# Patient Record
Sex: Male | Born: 1945 | Race: Black or African American | Hispanic: No | Marital: Married | Smoking: Former smoker
Health system: Southern US, Community
[De-identification: ages and names within clinical notes are randomized; demographics above are authoritative.]

## PROBLEM LIST (undated history)

## (undated) DIAGNOSIS — E119 Type 2 diabetes mellitus without complications: Secondary | ICD-10-CM

## (undated) DIAGNOSIS — I209 Angina pectoris, unspecified: Secondary | ICD-10-CM

## (undated) DIAGNOSIS — E669 Obesity, unspecified: Secondary | ICD-10-CM

## (undated) DIAGNOSIS — I739 Peripheral vascular disease, unspecified: Secondary | ICD-10-CM

## (undated) DIAGNOSIS — I119 Hypertensive heart disease without heart failure: Secondary | ICD-10-CM

## (undated) DIAGNOSIS — M519 Unspecified thoracic, thoracolumbar and lumbosacral intervertebral disc disorder: Secondary | ICD-10-CM

## (undated) DIAGNOSIS — I10 Essential (primary) hypertension: Secondary | ICD-10-CM

## (undated) HISTORY — PX: TONSILLECTOMY: SUR1361

## (undated) HISTORY — PX: JOINT REPLACEMENT: SHX530

## (undated) HISTORY — PX: BACK SURGERY: SHX140

---

## 1998-11-11 ENCOUNTER — Encounter: Payer: Self-pay | Admitting: Orthopedic Surgery

## 1998-11-16 ENCOUNTER — Encounter: Payer: Self-pay | Admitting: Orthopedic Surgery

## 1998-11-16 ENCOUNTER — Inpatient Hospital Stay (HOSPITAL_COMMUNITY): Admission: RE | Admit: 1998-11-16 | Discharge: 1998-11-20 | Payer: Self-pay | Admitting: Orthopedic Surgery

## 1999-09-04 ENCOUNTER — Emergency Department (HOSPITAL_COMMUNITY): Admission: EM | Admit: 1999-09-04 | Discharge: 1999-09-04 | Payer: Self-pay | Admitting: Emergency Medicine

## 1999-09-04 ENCOUNTER — Encounter: Payer: Self-pay | Admitting: Emergency Medicine

## 2000-11-04 ENCOUNTER — Encounter: Payer: Self-pay | Admitting: Orthopedic Surgery

## 2000-11-04 ENCOUNTER — Ambulatory Visit (HOSPITAL_COMMUNITY): Admission: RE | Admit: 2000-11-04 | Discharge: 2000-11-04 | Payer: Self-pay | Admitting: Orthopedic Surgery

## 2001-01-30 ENCOUNTER — Emergency Department (HOSPITAL_COMMUNITY): Admission: EM | Admit: 2001-01-30 | Discharge: 2001-01-30 | Payer: Self-pay

## 2001-05-25 ENCOUNTER — Emergency Department (HOSPITAL_COMMUNITY): Admission: EM | Admit: 2001-05-25 | Discharge: 2001-05-25 | Payer: Self-pay | Admitting: Emergency Medicine

## 2002-04-09 ENCOUNTER — Encounter: Admission: RE | Admit: 2002-04-09 | Discharge: 2002-06-16 | Payer: Self-pay

## 2004-06-17 ENCOUNTER — Emergency Department (HOSPITAL_COMMUNITY): Admission: EM | Admit: 2004-06-17 | Discharge: 2004-06-17 | Payer: Self-pay | Admitting: Emergency Medicine

## 2004-07-13 ENCOUNTER — Inpatient Hospital Stay (HOSPITAL_COMMUNITY): Admission: AD | Admit: 2004-07-13 | Discharge: 2004-07-16 | Payer: Self-pay | Admitting: Orthopaedic Surgery

## 2004-09-14 ENCOUNTER — Ambulatory Visit (HOSPITAL_COMMUNITY): Admission: RE | Admit: 2004-09-14 | Discharge: 2004-09-14 | Payer: Self-pay | Admitting: Orthopaedic Surgery

## 2004-09-22 ENCOUNTER — Encounter: Admission: RE | Admit: 2004-09-22 | Discharge: 2004-09-22 | Payer: Self-pay | Admitting: Orthopaedic Surgery

## 2005-11-26 ENCOUNTER — Encounter: Admission: RE | Admit: 2005-11-26 | Discharge: 2005-11-26 | Payer: Self-pay | Admitting: Family Medicine

## 2006-09-20 ENCOUNTER — Encounter: Admission: RE | Admit: 2006-09-20 | Discharge: 2006-09-20 | Payer: Self-pay | Admitting: Family Medicine

## 2008-02-11 ENCOUNTER — Encounter: Admission: RE | Admit: 2008-02-11 | Discharge: 2008-02-11 | Payer: Self-pay | Admitting: Cardiovascular Disease

## 2008-02-17 ENCOUNTER — Ambulatory Visit (HOSPITAL_COMMUNITY): Admission: RE | Admit: 2008-02-17 | Discharge: 2008-02-18 | Payer: Self-pay | Admitting: Cardiovascular Disease

## 2010-03-31 ENCOUNTER — Inpatient Hospital Stay (HOSPITAL_COMMUNITY): Admission: RE | Admit: 2010-03-31 | Discharge: 2010-04-04 | Payer: Self-pay | Admitting: Orthopaedic Surgery

## 2010-10-07 ENCOUNTER — Encounter: Payer: Self-pay | Admitting: Orthopaedic Surgery

## 2010-10-08 ENCOUNTER — Encounter: Payer: Self-pay | Admitting: Family Medicine

## 2010-10-13 ENCOUNTER — Encounter
Admission: RE | Admit: 2010-10-13 | Discharge: 2010-10-13 | Payer: Self-pay | Source: Home / Self Care | Attending: Family Medicine | Admitting: Family Medicine

## 2010-10-17 ENCOUNTER — Inpatient Hospital Stay (HOSPITAL_COMMUNITY)
Admission: AD | Admit: 2010-10-17 | Discharge: 2010-10-20 | DRG: 312 | Disposition: A | Discharge status: Home / Self Care | Payer: 59 | Attending: Cardiology | Admitting: Cardiology

## 2010-10-17 DIAGNOSIS — I428 Other cardiomyopathies: Secondary | ICD-10-CM | POA: Diagnosis present

## 2010-10-17 DIAGNOSIS — Z91199 Patient's noncompliance with other medical treatment and regimen due to unspecified reason: Secondary | ICD-10-CM

## 2010-10-17 DIAGNOSIS — I701 Atherosclerosis of renal artery: Secondary | ICD-10-CM | POA: Diagnosis present

## 2010-10-17 DIAGNOSIS — E119 Type 2 diabetes mellitus without complications: Secondary | ICD-10-CM | POA: Diagnosis present

## 2010-10-17 DIAGNOSIS — M4804 Spinal stenosis, thoracic region: Secondary | ICD-10-CM | POA: Diagnosis present

## 2010-10-17 DIAGNOSIS — F17200 Nicotine dependence, unspecified, uncomplicated: Secondary | ICD-10-CM | POA: Diagnosis present

## 2010-10-17 DIAGNOSIS — M479 Spondylosis, unspecified: Secondary | ICD-10-CM | POA: Diagnosis present

## 2010-10-17 DIAGNOSIS — I739 Peripheral vascular disease, unspecified: Secondary | ICD-10-CM | POA: Diagnosis present

## 2010-10-17 DIAGNOSIS — E785 Hyperlipidemia, unspecified: Secondary | ICD-10-CM | POA: Diagnosis present

## 2010-10-17 DIAGNOSIS — Z7982 Long term (current) use of aspirin: Secondary | ICD-10-CM

## 2010-10-17 DIAGNOSIS — M1990 Unspecified osteoarthritis, unspecified site: Secondary | ICD-10-CM | POA: Diagnosis present

## 2010-10-17 DIAGNOSIS — Z9119 Patient's noncompliance with other medical treatment and regimen: Secondary | ICD-10-CM

## 2010-10-17 DIAGNOSIS — I150 Renovascular hypertension: Secondary | ICD-10-CM | POA: Diagnosis present

## 2010-10-17 DIAGNOSIS — G8929 Other chronic pain: Secondary | ICD-10-CM | POA: Diagnosis present

## 2010-10-17 DIAGNOSIS — R55 Syncope and collapse: Principal | ICD-10-CM | POA: Diagnosis present

## 2010-10-17 LAB — DIFFERENTIAL
Basophils Absolute: 0 10*3/uL (ref 0.0–0.1)
Basophils Relative: 0 % (ref 0–1)
Eosinophils Absolute: 0.1 10*3/uL (ref 0.0–0.7)
Eosinophils Relative: 2 % (ref 0–5)
Lymphocytes Relative: 32 % (ref 12–46)
Lymphs Abs: 2.3 10*3/uL (ref 0.7–4.0)
Monocytes Absolute: 0.8 10*3/uL (ref 0.1–1.0)
Monocytes Relative: 11 % (ref 3–12)
Neutro Abs: 3.9 10*3/uL (ref 1.7–7.7)
Neutrophils Relative %: 55 % (ref 43–77)

## 2010-10-17 LAB — CBC
HCT: 37.8 % — ABNORMAL LOW (ref 39.0–52.0)
Hemoglobin: 11.8 g/dL — ABNORMAL LOW (ref 13.0–17.0)
MCH: 28 pg (ref 26.0–34.0)
MCHC: 31.2 g/dL (ref 30.0–36.0)
MCV: 89.8 fL (ref 78.0–100.0)
Platelets: 188 10*3/uL (ref 150–400)
RBC: 4.21 MIL/uL — ABNORMAL LOW (ref 4.22–5.81)
RDW: 19 % — ABNORMAL HIGH (ref 11.5–15.5)
WBC: 7.2 10*3/uL (ref 4.0–10.5)

## 2010-10-17 LAB — COMPREHENSIVE METABOLIC PANEL
ALT: 24 U/L (ref 0–53)
AST: 39 U/L — ABNORMAL HIGH (ref 0–37)
Albumin: 3.3 g/dL — ABNORMAL LOW (ref 3.5–5.2)
Alkaline Phosphatase: 82 U/L (ref 39–117)
BUN: 19 mg/dL (ref 6–23)
CO2: 29 mEq/L (ref 19–32)
Calcium: 9.3 mg/dL (ref 8.4–10.5)
Chloride: 102 mEq/L (ref 96–112)
Creatinine, Ser: 1.15 mg/dL (ref 0.4–1.5)
GFR calc Af Amer: >60 mL/min (ref >60)
GFR calc non Af Amer: >60 mL/min (ref >60)
Glucose, Bld: 136 mg/dL — ABNORMAL HIGH (ref 70–99)
Potassium: 3.6 mEq/L (ref 3.5–5.1)
Sodium: 141 mEq/L (ref 135–145)
Total Bilirubin: 0.4 mg/dL (ref 0.3–1.2)
Total Protein: 7 g/dL (ref 6.0–8.3)

## 2010-10-17 LAB — HEMOGLOBIN A1C
Hgb A1c MFr Bld: 6.9 % — ABNORMAL HIGH (ref <5.7)
Mean Plasma Glucose: 151 mg/dL — ABNORMAL HIGH (ref <117)

## 2010-10-17 LAB — BRAIN NATRIURETIC PEPTIDE: Pro B Natriuretic peptide (BNP): 492 pg/mL — ABNORMAL HIGH (ref 0.0–100.0)

## 2010-10-17 LAB — GLUCOSE, CAPILLARY
Glucose-Capillary: 100 mg/dL — ABNORMAL HIGH (ref 70–99)
Glucose-Capillary: 133 mg/dL — ABNORMAL HIGH (ref 70–99)

## 2010-10-17 LAB — TSH: TSH: 0.871 u[IU]/mL (ref 0.350–4.500)

## 2010-10-18 ENCOUNTER — Inpatient Hospital Stay (HOSPITAL_COMMUNITY): Payer: 59

## 2010-10-18 DIAGNOSIS — R55 Syncope and collapse: Secondary | ICD-10-CM

## 2010-10-18 LAB — D-DIMER, QUANTITATIVE

## 2010-10-18 LAB — BASIC METABOLIC PANEL
BUN: 14 mg/dL (ref 6–23)
CO2: 32 mEq/L (ref 19–32)
Calcium: 9.3 mg/dL (ref 8.4–10.5)
Chloride: 100 mEq/L (ref 96–112)
Creatinine, Ser: 1.1 mg/dL (ref 0.4–1.5)
GFR calc Af Amer: >60 mL/min (ref >60)
GFR calc non Af Amer: >60 mL/min (ref >60)
Glucose, Bld: 91 mg/dL (ref 70–99)
Potassium: 4.7 mEq/L (ref 3.5–5.1)
Sodium: 141 mEq/L (ref 135–145)

## 2010-10-18 LAB — CARDIAC PANEL(CRET KIN+CKTOT+MB+TROPI)
Relative Index: 1.9 (ref 0.0–2.5)
Troponin I: 0.05 ng/mL (ref 0.00–0.06)

## 2010-10-18 LAB — LIPID PANEL
Cholesterol: 158 mg/dL (ref 0–200)
HDL: 58 mg/dL (ref >39)
LDL Cholesterol: 87 mg/dL (ref 0–99)
Total CHOL/HDL Ratio: 2.7 RATIO
Triglycerides: 66 mg/dL (ref <150)
VLDL: 13 mg/dL (ref 0–40)

## 2010-10-18 LAB — GLUCOSE, CAPILLARY
Glucose-Capillary: 91 mg/dL (ref 70–99)
Glucose-Capillary: 148 mg/dL — ABNORMAL HIGH (ref 70–99)
Glucose-Capillary: 98 mg/dL (ref 70–99)

## 2010-10-18 MED ORDER — IOHEXOL 350 MG/ML SOLN
100.0000 mL | Freq: Once | INTRAVENOUS | Status: AC | PRN
  Administered 2010-10-18: 100 mL via INTRAVENOUS

## 2010-10-18 NOTE — H&P (Signed)
NAME:  Terry [name], Terry [name] NO.:  [id number]  MEDICAL RECORD NO.:  [id number]           PATIENT TYPE:  LOCATION:                                 FACILITY:  PHYSICIAN:  Landry Corporal, MD DATE OF BIRTH:  1946/08/21  DATE OF ADMISSION:  10/16/2010 DATE OF DISCHARGE:                             HISTORY & PHYSICAL   PRIMARY CARE PHYSICIAN:  Lillia Carmel, MD and Dr. Lynelle Doctor  PRIMARY CARDIOLOGIST:  Nicki Guadalajara, MD and also followed by Dr. Nanetta Batty.  REASON FOR ADMISSION:  Syncope.  HISTORY OF PRESENT ILLNESS:  Terry [name] is a very pleasant 65 year old African American gentleman followed by Dr. Tresa Endo and Dr. Allyson Sabal in his office at [facility] for longstanding hypertension, peripheral artery disease, and LVH by echocardiogram as well as other cardiac factors.  He has not been seeing him for quite some time, however.  He now comes in today.  He has not taken his blood pressure medicines several months, but he comes in today because of having multiple episodes of passing out over the last couple weeks.  He tells me he has had about 12 episodes over the last 2 weeks, but the most recent one being last night the October 15, 2010, and then also had another profound episode on Thursday last week.  His wife actually described these symptoms or the episodes were he is just sitting there; otherwise, doing fine without any problems.  Often times sitting and eating a meal or just sitting and talking and he just falls out.  He falls down to the ground, sometimes falls pretty hard.  He has no preceding symptoms of chest discomfort.  No palpitations.  No diaphoresis.  No flushing.  No shortness of breath.  No blurred vision. No weakness or numbness-type symptoms and when he comes back to, he checked his blood sugars that have been well over 100.  He has not felt jittery or flushed or anything and afterwards he is little bit confused of what happened, but  comes back and he knows where he is.  He is otherwise alert and oriented to where he is and is otherwise not postictal in nature.  He is a little bit concerned about these episodes of how frequent they are occurring.  He does not describe it as a feeling of blacking out.  He does not really know what his symptoms are like.  He just kind of wakes up and his wife describes these symptoms to me.  She does initially describe his eyes are rolling back or anything and he is not out for that long.  He otherwise denies any headaches, blurred vision, chest discomfort, respiratory exertion.  He has mild stable edema, but he has always had.  He denies any visual symptoms or of flashing lights in front of his eyes or any photophobia or bad headaches.  No weakness or numbness on one side versus the other.  His wife has noted somewhat slurred speech and slow action over the last several days that she had noticed in the first couple episodes.  He did  have a noncontrast CT scan done of his head, which did not show any evidence of any bleed.  He said he occasionally has had some diarrhea, but not had been related, this has been significantly worse over the last couple days.  MEDICATIONS:  He is not taking all of; 1. Benicar HCTZ 40/25 mg daily. 2. Toprol XL 200 mg b.i.d. 3. Cardizem 240 mg at bedtime.  I do not thinks he is taking these. 4. Lasix 40 mg daily. 5. Glucophage 500 mg daily. 6. OxyContin 40 mg t.i.d. 7. Fentanyl patch 50 mcg per hour every 3 days. 8. Aspirin 81 mg daily.  I am not sure which one of the medications he     is taking and which one is not.  ALLERGIES:  No known drug allergies.  PAST MEDICAL HISTORY: 1. 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 2. Type II diabetes. 3. Chronic renal artery stenosis with last peripheral angiogram     showing a stenosis of 60% back in June 2009.  This was  confirmed by     Dopplers.  No intervention was done at that time.  This is a left     renal artery stenosis.  His last renal aortic ratio was 3.47.  His     creatinine is 1.2 in May 2009. 4. Peripheral artery disease.  He had significant left common iliac     disease treated with balloon and stent in June 2009, also noted at     that time was occluded or subtotally occluded left superficial     femoral artery with two-vessel runoff.  His claudication I think is     better with the iliac stenting.  He does not complain of any     claudications now.  ABIs from June 2010, last was 0.63 and then the     right was 0.82. 5. Cardiac evaluation:  *Echocardiogram from March 2009, moderate concentric LVH with   normal function, ejection fraction 50-55% and impaired relaxation.    He has got mildly dilated right atrium with mildly dilated right   ventricle with normal atrial size. Mild mitral annular calcification   with no stenosis or regurgitation.  Moderate aortic sclerosis,   but no stenosis or regurgitation.    *Persantine Myoview May 2009, showed no evidence of ischemia or   scar post stress ejection fraction of 48% with mild hypokinesis   in the septal and basal inferior walls. 6. Type II diabetes. 7. Chronic back pain, status post multiple at least 15 operations. 8. Osteoarthritis with multiple hip surgeries at least 3-4 with his     last total right hip arthroplasty in July 2011. 9. Longstanding smoking history, but no documented history of chronic     obstructive pulmonary disease. 10.Hyperlipidemia.  SOCIAL HISTORY:  He had been divorced and he is accompanied by significant other today.  He is with his second wife today.  With his first wife, he had 1 child and 2 grandchildren.  He walks with a cane and does some of the yard work.  He previously worked as Primary school teacher.  He drinks occasional alcohol, beer, and liquor.  Smokes at least pack a day, has done so over 35  years.  He does not get a lot of exertional activity.  FAMILY HISTORY:  Essentially noncontributory.  His mother died in her 5s of acute myocardial infarction.  His father is still alive at  64 and brother alive at 77 without significant disease.  He has a son with "enlarged heart" at age 68.  PHYSICAL EXAMINATION:  GENERAL:  He is a very pleasant African American gentleman.  He was actually in no acute distress.  He does have somewhat slowed speech.  He is alert and oriented x3.  He answers questions appropriately. VITAL SIGNS:  Blood pressure is 180/88 to 190/90 with a heart rate of 65.  His weight is 208 pounds.  He is 6 feet 1 inches tall.  BMI is 27.5. HEENT:  Essentially normocephalic and atraumatic.  Extraocular muscles are intact.  He has got arcus senilis. NECK:  Supple with lymphadenopathy, thyromegaly, or JVD.  No evidence of carotid bruits. HEART:  Regular rate and rhythm.  Normal S1 and S2.  He does have S4 and gallop and is slightly hyperdynamic, but normal placed PMI.  Otherwise, no murmurs or rubs. LUNGS:  Clear to auscultation bilaterally.  No inspiratory effort during air movement.  No interstitial sounds.  Nonlabored. ABDOMEN:  Soft, nontender, and nondistended.  Normoactive bowel sounds. No hepatosplenomegaly. EXTREMITIES:  He has got diminished pulses bilaterally, but worse on his left than his right 1+.  Dorsalis pedis on the left as well as on the right, but no significant, maybe trace edema bilaterally. NEUROLOGIC:  Cranial nerves II through XII are grossly intact.  He is getting normal strength and the upper extremities were normal, somewhat diminished strength in the right and bilateral lower extremities, but at least 4-5/5 throughout.  LABORATORY DATA:  EKG today has normal sinus rhythm.  Heart rate is 79. He has got significant voltage criteria for LVH with concomitant ST-T wave changes diffusely.  There is also a rightward axis of 94.  His QT is also  prolonged with a QTC of 518.  ASSESSMENT/PLAN: 1. Mr. Daigle is a very pleasant 65 year old gentleman with multiple     risk factors for coronary disease and significant structural heart     disease with left ventricular hypertrophy, who now comes in with     multiple episodes of syncope.  The way they described sounds very     arrhythmogenic in nature.  He is now orthostatic, as he is seated.     It is not associated with any particular activity and it comes in     out of lieu.  They are thankfully not long-lasting and therefore     somewhat reassuring although an arrhythmogenic causes is highly     likely.  It does not sound like it is a stroke or any neurologic     cause although with his significant amount of back surgeries and     possible spinal stenosis that is one consideration.  Plan is to     have him admitted to a telemetry bed where we can monitor him on     telemetry and may be catch one of his episodes.  I think, he is     going to need a repeat echocardiogram, carotid Dopplers, and we     will actually do a CTA of head and keep doing the MRIs because of     his hip surgeries.  I want to make sure there is no evidence of any     residual stroke; although, I did notice it on exam.  We will check     bask routine labs to make sure no electrolyte abnormalities, but we     will likely consider getting EP evaluation  for possible EP study.     I think that he is very-very worrisome for some type of arrhythmia     more likely ventricular with an abnormal ventricle then,     supraventricular, but want to see what his telemetry shows. 2. For history of blood pressure, we will have p.r.n. hydralazine.  I     am going reluctant to put on any metoprolol or Cardizem until I am     never sure he is not bradycardic in the hospital, but that will     likely need to be at least one of them need to be re-added prior to     discharge.  Continue on his ARB and diuretic for blood pressure      control and adjust the medications while he is in the hospital. 3. Diabetes.  Hold metformin for risk factors, he is going to be     getting contrast releases CT scan.  If not potential cardiac     catheterization, we will put him on sliding scale insulin.  He will     probably need a TSH checked. 4. He is not on statin and probably his Poly-Vi-Sol should be started     while he is in the hospital.  We should keep on his aspirin for     now. 5. Chronic pain.  We will keep his on his OxyContin and fentanyl     patches.  We will also give him Lasix p.r.n.  We will check a BNP     just to make sure his heart failure symptoms are elevated or not     considering I did not     plan anything. 6. His code status is full code.  He will need DVT prophylaxis.  I     think, we will probably do subcu heparin and H2 blocker for GI     prophylaxis.          ______________________________ Landry Corporal, MD     DWH/MEDQ  D:  10/16/2010  T:  10/16/2010  Job:  [id number]  cc:   Dr. Lillard Anes, M.D.  Electronically Signed by Bryan Lemma MD on 10/18/2010 07:30:05 AM

## 2010-10-19 LAB — GLUCOSE, CAPILLARY: Glucose-Capillary: 133 mg/dL — ABNORMAL HIGH (ref 70–99)

## 2010-10-19 LAB — CBC
Hemoglobin: 13.7 g/dL (ref 13.0–17.0)
MCH: 28.4 pg (ref 26.0–34.0)
MCHC: 31.7 g/dL (ref 30.0–36.0)

## 2010-10-19 LAB — PROTIME-INR: Prothrombin Time: 13.5 seconds (ref 11.6–15.2)

## 2010-10-20 ENCOUNTER — Other Ambulatory Visit (HOSPITAL_COMMUNITY): Payer: Self-pay | Admitting: Cardiovascular Disease

## 2010-10-20 DIAGNOSIS — R55 Syncope and collapse: Secondary | ICD-10-CM

## 2010-10-20 LAB — CBC
Hemoglobin: 12.8 g/dL — ABNORMAL LOW (ref 13.0–17.0)
RBC: 4.55 MIL/uL (ref 4.22–5.81)

## 2010-10-20 LAB — BASIC METABOLIC PANEL
CO2: 32 mEq/L (ref 19–32)
Chloride: 98 mEq/L (ref 96–112)
GFR calc Af Amer: >60 mL/min (ref >60)
Sodium: 138 mEq/L (ref 135–145)

## 2010-10-20 LAB — GLUCOSE, CAPILLARY: Glucose-Capillary: 138 mg/dL — ABNORMAL HIGH (ref 70–99)

## 2010-10-21 ENCOUNTER — Encounter: Payer: Self-pay | Admitting: Family Medicine

## 2010-11-01 NOTE — Consult Note (Signed)
NAMERAYJON, WERY NO.:  [id number]  MEDICAL RECORD NO.:  [id number]           PATIENT TYPE:  I  LOCATION:  3705                         FACILITY:  [facility]  PHYSICIAN:  Melvyn Novas, M.D.  DATE OF BIRTH:  [birth year]  DATE OF CONSULTATION:  10/19/2010 DATE OF DISCHARGE:                                CONSULTATION   CONSULTING PHYSICIAN:  Landry Corporal, MD, [facility].  REASON FOR CONSULTATION:  Syncopal attacks.  HISTORY OF PRESENT ILLNESS:  The patient is a 65 year old African American male with past medical history of severe hypertension and noncompliance to hypertensive medications, who presents with multiple episodes of syncope over the last 2 weeks.  The patient reports about 8- 10 episodes that spans over last 2 weeks and at times happened in cluster.  At times they are resulted in a fall and injury.  The syncopal attacks are not particularly associated with positional changes, vagal maneuvers, or palpitation.  There were no witnessed seizure activity reported.  The patient had no aura or other related symptoms to the syncopal attacks.  The patient reports complete recovery without any confusion or further events.  The patient does not have any focal deficits.  The patient does have frequency of urination, but no burning or urgency at this time.  The patient's syncopal attacks have happened in sitting as well as standing position.  The patient does not report any orthostatic hypotension or vertigo at this time.  PAST MEDICAL HISTORY:  Positive for, 1. Hypertension. 2. Diabetes. 3. CHF. 4. Grade 2 diastolic heart failure as per 2-D echo performed on     October 17, 2010. 5. Peripheral arterial disease with significant left common iliac     disease, treated with balloon and stenting in June 2009.  Also     noted to have subtotally occluded left superficial femoral artery     and two-vessel runoff. 6. Chronic back pain  status post multiple surgeries for lumbar     stenosis and cervical surgeries for radiculopathy. 7. Osteoarthritis and multiple hip surgeries, which includes 2 hip     replacements, the last one in July 2011. 8. Chronic smoking history, but no documented history of COPD. 9. Hyperlipidemia.  MEDICATIONS:  It is unclear how compliant the patient is to the medications.  The patient was supposed to be on the following medications: 1. Benicar with hydrochlorothiazide 40/25 mg daily. 2. Toprol-XL 200 mg twice a day. 3. Cardizem 240 mg at bedtime. 4. Lasix 40 mg daily. 5. Glucophage 500 mg daily. 6. OxyContin 40 mg t.i.d. 7. Fentanyl patch 50 mcg per hour every 3 days. 8. Aspirin 81 mg daily.  ALLERGIES:  No known drug allergies.  FAMILY HISTORY:  Not pertinent.  There is no family history of seizures.  SOCIAL HISTORY:  The patient smokes one pack per day for last 35 years, uses occasional alcohol and reports no illicit drug use.  REVIEW OF SYMPTOMS:  As per HPI.  PHYSICAL EXAMINATION:  VITAL SIGNS:  Temperature 98, blood pressure 194/109, pulse is 67, respiratory rate of 16, 94% oxygen saturation on room  air. GENERAL:  The patient is not in acute distress. CARDIOVASCULAR:  S1 and S2 is are pronounced, but no murmurs.  Regular rate and rhythm. CARDIOPULMONARY:  Clear to auscultation bilaterally. ABDOMEN:  Soft, nontender.  No organomegaly.  Renal bruit heard. CENTRAL NERVOUS SYSTEM:  Mental Status:  Alert and oriented x3.  Carries out two-step commands.  Cranial Nerves:  Eyes PERRLA.  EOMI.  Visual fields are intact.  Face is symmetrical with midline tongue and uvula. No slurred speech or facial droop noted.  Sensation is intact in V1-V3. The patient has normal shoulder shrug and head turning ability. Coordination is intact, which was examined by finger-nose and heel-to- shin test.  The patient's gait is altered that is mainly from the arthroplasty of the hip.  There are no  resting tremors.  Motor Exam: 5/5 on all extremities.  Deep tendon reflexes are +3 at all joints. PSYCH:  Appropriate.  LABORATORY DATA:  The patient's lab at the time of consultation; hemoglobin 13.7, white count 6.2, platelets 216.  Sodium is 141, potassium 4.7, chloride 100, bicarb 32, BUN is 14, creatinine is 1.1 with glucose of 94.  PT is 13.5, INR is 1.01.  CK 610, CK-MB is 11.4. Troponin is 0.05.  HDL is 58, LDL was 87, cholesterol 158 with triglycerides 66.  A1c was 6.9.  TSH was 0.87.  IMAGING TESTS:  CT head without contrast, no acute infarct was noted. White matter lesions likely secondary to small vessel ischemia.  Permeative process within clivus.  Recommend MRI.  IMAGING STUDIES:  Carotid Dopplers, which is normal without any significant obstruction.  ASSESSMENT AND PLAN:  Syncopal attacks.  It is very doubtful that these are of neurologic origin, and are most likely related to uncontrolled hypertension and diastolic cardiomyopathy.  Since CT scan of the head had some abnormalities that needs further investigation, we would consider CT angio with catheter for more imaging information.  The patient cannot get MRI due to prosthesis.  Given that the patient had CT angiogram for chest to rule out pulmonary embolism yesterday, we would consider this procedure to be done on coming Monday, which is 3 days from now.  We will also recommend further workup of renal artery stenosis with renal ultrasound if not done in last 24 months. The patient was advised on smoking cessation.  We have also changed clonidine pills to the patch for better compliance and uniform release.  The patient will be continued on low-salt diet and carb-modified diet.  Thank you for consultation.     Clerance Lav, MD PhD   ______________________________ Melvyn Novas, M.D.    RS/MEDQ  D:  10/19/2010  T:  10/20/2010  Job:  [id number]  cc:   Landry Corporal, MD  Electronically Signed by  Clerance Lav MD PHD on 10/24/2010 04:54:10 PM Electronically Signed by Melvyn Novas M.D. on 11/01/2010 09:36:19 AM

## 2010-11-03 ENCOUNTER — Other Ambulatory Visit (HOSPITAL_COMMUNITY): Payer: Medicare Other

## 2010-11-06 ENCOUNTER — Other Ambulatory Visit (HOSPITAL_COMMUNITY): Payer: Medicare Other

## 2010-11-15 NOTE — Discharge Summary (Signed)
Terry [name], Terry [name]                  ACCOUNT NO.:  [id number]  MEDICAL RECORD NO.:  [id number]           PATIENT TYPE:  I  LOCATION:  3705                         FACILITY:  [facility]  PHYSICIAN:  Ritta Slot, MD     DATE OF BIRTH:  [birth year]  DATE OF ADMISSION:  10/17/2010 DATE OF DISCHARGE:  10/20/2010                              DISCHARGE SUMMARY   DISCHARGE DIAGNOSES: 1. Syncope.  Non-neurogenic cause. 2. Uncontrolled hypertension. 3. 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  Thoracic spinal stenosis. 4. Low-risk nuclear stress test in 2009. 5. Grade 3 diastolic dysfunction. 6. Peripheral vascular disease status post left common iliac stent in     June 2009. 7. Renal artery stenosis of the left renal artery, 90% diameter     reduction via vascular ultrasound on Jan 25, 2010. 8. Noncompliance with medications.  HOSPITAL COURSE:  Mr. Terry [name] is a very pleasant 65 year old African American gentleman who is followed by Dr. Tresa Endo at [facility].  He has a history of hypertension, which is difficult to control, peripheral artery disease, left ventricular hypertrophy by echocardiogram.  Mr. Terry [name] has not been seen in [facility] for several months.  He also states he had not been taking his blood pressure medications for several months as well.  He came in to the office after having multiple episodes of passing out over the last couple of weeks, and the most recent one was on October 15, 2010.  His wife describes symptoms when he is just sitting there and he just falls out.  Mr. Terry [name] was sent for noncontrast CT scan on October 13, 2010, prior to this admission and there was no evidence of acute infarction. White matter hypodensity is likely related to small-vessel ischemia. There was no evidence of intracranial hemorrhage.  It was noted permeative process within the clivus.  It was recommended that an MRI brain with contrast be conducted,  however, the patient has multiple metal components due to back surgery.  Mr. Terry [name] was admitted on the telemetry unit.  A 2D echocardiogram, carotid Dopplers, CT angiogram of the chest were ordered.  CT of the chest showed no evidence of acute pulmonary embolism.  There was evidence of chronic lung disease and nonspecific hilar mild mediastinal lymphadenopathy.  This was favoring an inflammatory process.  Cardiomegaly noted as well as atherosclerosis. Degenerative changes in the spine with subsequent thoracic spinal stenosis at T9-T10.  Carotid duplex study was completed on October 18, 2010, there was no significant extracranial carotid artery stenosis demonstrated.  Vertebrals were patent with antegrade flow.  Neurology consult was requested.  Neurology noted that it was doubtful that these were neurologic syncopal episodes and more likely related to uncontrolled hypertension and diastolic cardiomyopathy.  They did state that they would consider CT angiogram with catheter of the head for further information given previous CT abnormalities that were noted. They also recommended his clonidine pills to be changed to a patch which had been done.  Mr. Terry [name] continues to have fluctuating blood pressures during his stay, the systolic pressure as high as 212, diastolic as  high as 110, systolic blood pressure as low as 114 and diastolic as low as 76.  The patient continued without any feelings of presyncopal episodes during his stay until October 19, 2010, at which time 1300 hours, the patient said he had feeling of syncope episode while on bed.  He did not actually pass out.  There was no sign of EKG changes on telemetry. Currently, Mr. Terry [name] has been seen by Dr. Lynnea Ferrier who feels that he is stable for discharge with followup renal artery Dopplers as well as follow up with Dr. Tresa Endo in 2 weeks.  DISCHARGE LABS:  WBC 5.4, hemoglobin 12.8, hematocrit 40.6, platelets 208.  Sodium 138, potassium  5.0, chloride 98, carbon dioxide 32, glucose 105, BUN 18, creatinine 1.25, calcium 9.4.  Negative troponin.  Total cholesterol 158, triglycerides 66, HDL 58, LDL 87.  STUDY/PROCEDURES: 1. CT of the head without contrast.  Impression, no CT evidence of     acute infarction.  White matter hypodensity is likely related to     small-vessel ischemia.  No evidence of intracranial hemorrhage.     Permeative process within the clivus.  Differential includes     fibrous dysplasia versus malignancy.  Recommend MRA of brain     without contrast for further evaluation. 2. CT angiogram of chest on October 18, 2010.  IMPRESSION: 1. No evidence of acute pulmonary embolus.  No evidence of chronic     lung disease.  Nonspecific hilar mild mediastinal lymphadenopathy,     favoring inflammatory process.  Cardiomegaly.  Atherosclerosis.     Degenerative changes in the spine with subsequent thoracic spinal     stenosis at T9-T10. 2. Carotid Dopplers conducted on October 18, 2010, there was no     significant extracranial carotid artery stenosis demonstrated     vertebrals are patent with antegrade flow.  DISCHARGE MEDICATIONS: 1. Amlodipine 10 mg 1 tablet by mouth daily. 2. Clonidine 0.1 mg for 24 hours transdermally 1 patch weekly. 3. Metoprolol tartrate 25 mg 1/2 tablet by mouth daily. 4. Aspirin enteric-coated 81 mg 1 tab by mouth every morning. 5. Benicar HCT 40/25 mg 1 tablet by mouth every morning. 6. Fentanyl patch 50 mcg per hour 1 patch transdermally every 3 days. 7. Lasix 20 mg 1 tab by mouth every morning. 8. Metformin 500 mg 1 tablet by mouth 3 times a day. 9. Roxicodone 15 mg 1 tablet by mouth 3 times a day. 10.OxyContin 40 mg 1 tablet by mouth 3 times a day.  DISPOSITION:  Mr. Terry [name] will be discharged home in stable condition.  He is to increase activity slowly.  He will have low-sodium, heart-healthy, low-carbohydrate diet.  He will be scheduled for renal artery ultrasound on  Thursday, October 26, 2010, at 2 p.m. as well as a followup appointment on Tuesday, October 31, 2010, at 10:45 a.m. with Dr. Tresa Endo at [facility].  Also given the recommendations of Neurology will schedule a CT angiogram with catheter for further diagnostic workup.    ______________________________ Wilburt Finlay, PA   ______________________________ Ritta Slot, MD    BH/MEDQ  D:  10/20/2010  T:  10/21/2010  Job:  [id number]  cc:   Lillia Carmel, M.D.  Electronically Signed by Wilburt Finlay PA on 11/01/2010 03:45:02 PM Electronically Signed by Ritta Slot MD on 11/15/2010 02:25:23 PM

## 2010-12-02 LAB — GLUCOSE, CAPILLARY
Glucose-Capillary: 107 mg/dL — ABNORMAL HIGH (ref 70–99)
Glucose-Capillary: 118 mg/dL — ABNORMAL HIGH (ref 70–99)
Glucose-Capillary: 131 mg/dL — ABNORMAL HIGH (ref 70–99)
Glucose-Capillary: 131 mg/dL — ABNORMAL HIGH (ref 70–99)
Glucose-Capillary: 132 mg/dL — ABNORMAL HIGH (ref 70–99)
Glucose-Capillary: 138 mg/dL — ABNORMAL HIGH (ref 70–99)
Glucose-Capillary: 191 mg/dL — ABNORMAL HIGH (ref 70–99)

## 2010-12-02 LAB — CBC
HCT: 31.8 % — ABNORMAL LOW (ref 39.0–52.0)
HCT: 33.2 % — ABNORMAL LOW (ref 39.0–52.0)
MCH: 31 pg (ref 26.0–34.0)
MCHC: 33.3 g/dL (ref 30.0–36.0)
MCHC: 33.5 g/dL (ref 30.0–36.0)
MCV: 91.4 fL (ref 78.0–100.0)
MCV: 92.3 fL (ref 78.0–100.0)
Platelets: 199 10*3/uL (ref 150–400)
RBC: 3.35 MIL/uL — ABNORMAL LOW (ref 4.22–5.81)
RDW: 16.5 % — ABNORMAL HIGH (ref 11.5–15.5)
RDW: 17 % — ABNORMAL HIGH (ref 11.5–15.5)
RDW: 17.1 % — ABNORMAL HIGH (ref 11.5–15.5)

## 2010-12-02 LAB — BASIC METABOLIC PANEL
BUN: 11 mg/dL (ref 6–23)
CO2: 34 mEq/L — ABNORMAL HIGH (ref 19–32)
Chloride: 102 mEq/L (ref 96–112)
Chloride: 96 mEq/L (ref 96–112)
Creatinine, Ser: 0.99 mg/dL (ref 0.4–1.5)
Creatinine, Ser: 1.03 mg/dL (ref 0.4–1.5)
GFR calc Af Amer: >60 mL/min (ref >60)
Glucose, Bld: 104 mg/dL — ABNORMAL HIGH (ref 70–99)
Potassium: 3.4 mEq/L — ABNORMAL LOW (ref 3.5–5.1)

## 2010-12-02 LAB — PROTIME-INR
INR: 1.71 — ABNORMAL HIGH (ref 0.00–1.49)
INR: 2.21 — ABNORMAL HIGH (ref 0.00–1.49)
Prothrombin Time: 24.3 seconds — ABNORMAL HIGH (ref 11.6–15.2)

## 2010-12-03 LAB — DIFFERENTIAL
Basophils Absolute: 0 10*3/uL (ref 0.0–0.1)
Basophils Relative: 0 % (ref 0–1)
Lymphocytes Relative: 27 % (ref 12–46)
Neutro Abs: 4.8 10*3/uL (ref 1.7–7.7)
Neutrophils Relative %: 59 % (ref 43–77)

## 2010-12-03 LAB — URINE MICROSCOPIC-ADD ON

## 2010-12-03 LAB — URINALYSIS, ROUTINE W REFLEX MICROSCOPIC
Bilirubin Urine: NEGATIVE
Hgb urine dipstick: NEGATIVE
Ketones, ur: NEGATIVE mg/dL
Nitrite: NEGATIVE
Specific Gravity, Urine: 1.011 (ref 1.005–1.030)
Urobilinogen, UA: 1 mg/dL (ref 0.0–1.0)

## 2010-12-03 LAB — PROTIME-INR: Prothrombin Time: 13.5 seconds (ref 11.6–15.2)

## 2010-12-03 LAB — SURGICAL PCR SCREEN
MRSA, PCR: NEGATIVE
Staphylococcus aureus: NEGATIVE

## 2010-12-03 LAB — BASIC METABOLIC PANEL
BUN: 18 mg/dL (ref 6–23)
Calcium: 9.4 mg/dL (ref 8.4–10.5)
Creatinine, Ser: 1.13 mg/dL (ref 0.4–1.5)
GFR calc non Af Amer: >60 mL/min (ref >60)
Potassium: 4.5 mEq/L (ref 3.5–5.1)

## 2010-12-03 LAB — CBC
Platelets: 211 10*3/uL (ref 150–400)
RDW: 16.7 % — ABNORMAL HIGH (ref 11.5–15.5)
WBC: 8.1 10*3/uL (ref 4.0–10.5)

## 2011-01-30 NOTE — Cardiovascular Report (Signed)
[name], [name] NO.:  [id number]   MEDICAL RECORD NO.:  [id number]          PATIENT TYPE:  OIB   LOCATION:  2903                         FACILITY:  [facility]   PHYSICIAN:  Nanetta Batty, M.D.   DATE OF BIRTH:  November 20, 1945   DATE OF PROCEDURE:  02/17/2008  DATE OF DISCHARGE:                            CARDIAC CATHETERIZATION   PERIPHERAL ANGIOGRAM/PTA AND STENT PROCEDURE:  Terry [name] is a 65-year-  old African American male with a history of difficult-to-control  hypertension, non-insulin-requiring diabetes, and hyperlipidemia.  He  has had bilateral hip replacement and has spinal stenosis.  He complains  of low back pain and left greater than right lower extremity pain.  Dopplers at our office revealed moderate left renal artery stenosis with  left ABI 0.58 and occluded left popliteal with high-grade left common  iliac artery stenosis.  The right ABI is 0.9.  He presents now for  angiography and potential intervention.   DESCRIPTION OF PROCEDURE:  The patient was brought to the second floor  of the [facility] angiographic suite in postabsorptive state.  He was  premedicated with p.o. Valium, IV fentanyl, and Versed.  His right groin  was prepped and shaved in the usual sterile fashion.  Xylocaine 1% was  used for local anesthesia.  A 5-French sheath was inserted into the  right femoral artery using standard Seldinger technique.  A 5-French  Tennis Racquet catheter was used for midstream and distal abdominal  aortography.  A left lower extremity angiography had been performed  through an end-hole catheter, placed in the left common femoral artery  with a crossover catheter and a Wholey wire.  Visipaque dye was used for  the entirety of the case.  Retrograde aortic pressures were monitored  during the case.   ANGIOGRAPHIC RESULTS:  1. Abdominal aorta.      a.     Renal arteries - 60% left renal artery stenosis.      b.     Infrarenal abdominal aorta,  moderate atherosclerotic       changes.  2. Left lower extremity:      a.     An 80% fairly focal mid-left common iliac artery stenosis.      b.     Long 60% proximal, 90%-95% mid SFA stenosis.  There was a       short-segment occlusion of the popliteal.  There was two-vessel       runoff with an occluded posterior tibial.   The end-hole catheter was then exchanged for a 6-French terminal  crossover sheath.  The patient received 3000 units of heparin  intravenously.  He was in a lot of pain while lying supine on the table  and received a lot of sedation and narcotics for analgesia.  The iliac  lesion was dilated with a 0.060 PowerFlex.  It was stented with a 10 x 4  Luminexx nitinol self-expanding stent and postdilated with a 0.073  PowerFlex and an 0.082 PowerFlex resulting in  reduction of 80% stenosis to 0% residual.  The patient tolerated the  procedure  well.  The sheath was removed and pressure was used on the  groin to achieve hemostasis.  The patient left the lab in stable  condition.  He will be gently hydrated, discharged home in the morning,  and will see me back in the office in followup.      Nanetta Batty, M.D.  Electronically Signed     JB/MEDQ  D:  02/17/2008  T:  02/18/2008  Job:  [id number]   cc:   [facility]  [facility]  Nicki Guadalajara, M.D.

## 2011-01-30 NOTE — Discharge Summary (Signed)
Terry [name], Terry [name]                  ACCOUNT NO.:  [id number]   MEDICAL RECORD NO.:  [id number]          PATIENT TYPE:  OIB   LOCATION:  2903                         FACILITY:  [facility]   PHYSICIAN:  Abelino Derrick, P.A.   DATE OF BIRTH:  06/19/1946   DATE OF ADMISSION:  02/17/2008  DATE OF DISCHARGE:  02/18/2008                               DISCHARGE SUMMARY   DISCHARGE DIAGNOSES:  1. Claudication, left common iliac artery percutaneous transluminal      angioplasty and stenting this admission by Dr. Allyson Sabal with residual      95% left superficial femoral artery and 60% left renal artery      stenosis.  2. Hypertension, difficult to control on multiple medications.  3. Non-insulin-dependent diabetes.  4. History of previous smoking.  5. Low-risk Myoview in May 2009 with good left ventricular function.  6. Diastolic dysfunction by echo.  7. Chronic pain, the patient is on OxyContin and fentanyl.   HOSPITAL COURSE:  Terry [name] is a 65 year old with multiple risk factors  as noted above and uncontrolled hypertension.  Terry [name] had leg pain worrisome  for claudication.  Lower extremity Dopplers were abnormal as an  outpatient with an ABI of 0.58 on the left.  There is also some  suggestion of left renal artery stenosis.  Terry [name] was admitted for elective  PV angiogram by Dr. Allyson Sabal on February 17, 2008.  This revealed a 60% left  renal artery stenosis, 80% left common iliac, and 95% mid left SFA with  2-vessel runoff.  Terry [name] underwent a left common iliac artery PTA and  stenting.  Postprocedure, Terry [name] did well.  Terry [name] was hypertensive and was put  on labetalol drip.  The plan was to hydrate him and get his pressure  under better control.  The patient pretty much insisted on discharge  after lunch on February 18, 2008.  Dr. Allyson Sabal wants to see him back in the  office as the patient may require left fem-pop bypass grafting.  We did  add BiDil and Plavix and Pepcid to his medications.  We feel Terry [name] can be  discharged in the  afternoon on February 18, 2008.  Terry [name] will need follow up  with Dr. Allyson Sabal.   DISCHARGE MEDICATIONS:  1. Benicar HCT 40/25 daily.  2. Toprol-XL 200 mg b.i.d.  3. Lipitor 80 mg a day.  4. Lasix 40 mg a day.  5. Amaryl 1 mg a day.  6. OxyContin 40 mg p.r.n. t.i.d.  7. Fentanyl patch 50 mcg every three days.  8. Tekturna 300 mg a day.  9. Diltiazem 240 mg a day.  10.Cardura 1 mg a day.  11.Aspirin 81 mg two tablets a day.  12.BiDil has been added 1 t.i.d.  13.Plavix 75 mg a day.  14.Pepcid AC.  15.Terry [name] can resume his Glucophage on February 19, 2008.   LABS:  Sodium 138, potassium 3.3, BUN 16, creatinine 1.0, and glucose  101.  White count 8.2, hemoglobin 10.8, hematocrit 31.2, and platelets  211,000.  Telemetry shows sinus rhythm.  EKG shows sinus rhythm with  LVH.  DISPOSITION:  The patient was discharged in stable condition and will  follow up with Dr. Allyson Sabal.  Terry [name] will need outpatient Dopplers.      Abelino Derrick, P.ALenard Lance  D:  02/18/2008  T:  02/19/2008  Job:  [id number]

## 2011-02-02 NOTE — Consult Note (Signed)
NAME:  Terry [name], Terry [name]                            ACCOUNT NO.:  [id number]   MEDICAL RECORD NO.:  [id number]                   PATIENT TYPE:  REC   LOCATION:  TPC                                  FACILITY:  [facility]   PHYSICIAN:  Sondra Come, D.O.                 DATE OF BIRTH:  1946-09-17   DATE OF CONSULTATION:  04/10/2002  DATE OF DISCHARGE:                  PHYSICAL MEDICINE & REHABILITATION CONSULTATION   Dear Dr. Georgina Pillion:   Thank you very much for kindly referring [name].  Terry [name] was evaluated in  our clinic today.  Please refer to the following for details regarding the  history, physical examination, and treatment recommendations.  Once again,  thank you for allowing [place] to participate in the care of Terry [name].   CHIEF COMPLAINT:  Back and leg pain.   HISTORY OF PRESENT ILLNESS:  Terry [name] is a pleasant 65 year old right hand  dominant male with a long history of low back pain.  He is status post 13  lumbar surgeries.  His last surgery he states was a fusion from L1-S1.  He  was followed by Dr. Larence Penning at a pain clinic in [place] when he was living  there.  He underwent a spinal cord stimulator implantation and was  apparently doing fairly well until approximately three to four months ago  when he began to experience worsening of his low back pain which has been  progressive.  He apparently moved to [facility] in August 2001.  He finally  sought medical attention approximately one month ago from his primary care  Tobey Schmelzle who at the time prescribed Fiorinal No.3.  He was then referred to  our clinic for evaluation and further management.  I review health and  history form and 14 point review of systems.  The patient's pain is rated as  a 9/10 on a subjective scale involving his low back radiating to bilateral  lower extremities.  He describes his pain as constant and sharp with  associated tingling.   Symptoms are worse with bending and sitting and  improved with rest.  His function and quality of life indexes have declined.  His sleep is fair to poor.  He is not currently taking any medications.  He  has had chronic gait disturbance secondary to chronic pain.  He has also had  chronic absent left patellar reflex and chronic numbness and paraesthesias  in the left lateral leg.  The patient denies bowel and bladder dysfunction.  Denies fevers, chills, night sweats, or weight loss.  He feels like his  stimulator is still working and has adjusted it himself without any  improvement.   PAST MEDICAL HISTORY:  Hypertension.   PAST SURGICAL HISTORY:  Thirteen lumbar surgeries, bilateral total hip  arthroplasty with a total of three surgeries on the right and one surgery on  the left.  FAMILY HISTORY:  Hypertension.   SOCIAL HISTORY:  The patient smokes one pack of cigarettes per day and I  counsel him on the importance of smoking cessation in terms of back pain and  overall health.  He admits to occasional alcohol use.  Denies drug use.  He  is married and not currently working.  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   ALLERGIES:  No known drug allergies.   MEDICATIONS:  Metoprolol, doxazosin, Norvasc, Aceon, and Avalide.    PHYSICAL EXAMINATION:  GENERAL:  Healthy male in no acute distress.  Gait is slightly antalgic.   VITAL SIGNS:  Blood pressure 123/65, pulse 81, respirations 18, O2  saturation 96% on room air.   BACK:  Large healed vertical midline incisional scar with increased lumbar  lordosis.  There is also a small scar to the left lumbar region with a  palpable stimulator subcutaneously.  Pelvic height is level but patient has  a difficult time standing in one position.  Range of motion is limited and  guarded in all planes.   NEUROLOGIC:  Manual muscle testing is 5/5 bilateral knee extensors, right  ankle dorsiflexor, extensor hallices longus, and ankle  plantar flexors, 4/5  bilateral hip flexors with giveaway weakness, and less than 3/5 left ankle  dorsiflexion, extensor hallices longus, ankle plantar flexion with giveaway  weakness.  Sensory examination reveals decreased light touch in the left  lateral leg.  Muscle stretch reflexes are 2/4 right patellar and left  Achilles and 0/4 bilateral medial hamstrings, left patellar, and right  Achilles.  Straight leg raise is nonphysiologic with significant low back  and lower extremity pain at less than 10 degrees of hip flexion.  Palpatory  examination reveals significant tenderness to palpation bilateral lumbar  paraspinals with mild spasm.   IMPRESSION:  1. Chronic low back pain status post lumbar surgery x13.  2. Status post spinal cord stimulator implantation.  3. Lumbar spasm.   PLAN:  1. Had a long discussion with Terry [name] regarding treatment options.  At     this point I am wondering whether or not his stimulator is working     appropriately as he was doing fairly well until three to four months ago.     He has not had his stimulator checked in quite some time and I would like     to have him set up to have his stimulator checked.  Will have nursing     staff contact [employer] to see if a representative can come out and     evaluate his stimulator at next visit.  Would consider having him follow     back with Dr. Larence Penning in [place] or even follow up with one of the     other pain clinics in town that deals with spinal cord stimulators as I     do not.  2. Will begin Flexeril 10 mg one p.o. q.8h. as needed for spasm 30 without     refills.  3. Will give patient a trial of Celebrex 200 mg one p.o. b.i.d. 30 samples     provided.  4. Will begin Zonegran 100 mg one p.o. q.d. 14 samples given for neuropathic     component.  5. The patient is to return to clinic in two weeks for reevaluation.  Will    likely try to get further records from patient's previous pain clinic if     he  chooses to continue to follow here.  The patient was educated on the above findings and recommendations and  understands.  No barriers to communication.                                               Sondra Come, D.O.    JJW/MEDQ  D:  04/10/2002  T:  04/17/2002  Job:  [id number]   cc:   Oley Balm. Georgina Pillion, M.D.

## 2011-02-02 NOTE — Op Note (Signed)
NAME:  Terry [name], Terry [name]                  ACCOUNT NO.:  [id number]   MEDICAL RECORD NO.:  [id number]          PATIENT TYPE:  OIB   LOCATION:  5711                         FACILITY:  [facility]   PHYSICIAN:  Sharolyn Douglas, M.D.        DATE OF BIRTH:  01-10-1946   DATE OF PROCEDURE:  07/14/2004  DATE OF DISCHARGE:                                 OPERATIVE REPORT   ADDENDUM:  The final fusion was explored, L2 down to the sacrum and this was  appeared to be solid at all levels with robust fusion mass located in the  lateral gutters.       MC/MEDQ  D:  07/14/2004  T:  07/14/2004  Job:  [id number]

## 2011-02-02 NOTE — H&P (Signed)
NAME:  Terry [name], Terry [name]                  ACCOUNT NO.:  [id number]   MEDICAL RECORD NO.:  [id number]          PATIENT TYPE:  OIB   LOCATION:  NA                           FACILITY:  [facility]   PHYSICIAN:  Sharolyn Douglas, M.D.        DATE OF BIRTH:  11-16-1945   DATE OF ADMISSION:  07/13/2004  DATE OF DISCHARGE:                                HISTORY & PHYSICAL   CHIEF COMPLAINT:  Pain in my back.   HISTORY OF PRESENT ILLNESS:  This 65 year old black male has had over 20  surgeries to his lumbosacral spine.  [place] in 1998.  He has had spinal cord  stimulator, plates and screws, implants.  He has had battery pack changed at  least three times in the past.  His pain is now is interfering with his day  to day activity.  He has to walk with a cane.  His chronic back pain  radiates into the left lower extremity with some on the right.   The patient has developed a draining sinus from his lumbar wound.  Dr.  Nolon Bussing. Hilts referred him to Dr. Veverly Fells. Ophelia Charter here in [place] who  began him on Clindamycin 300 mg 1 p.o. q.i.d.  CT scan showed a lumbar  abscess in the low back.  He was eventually referred to [place] for treatment.   Examination of the lumbar spine reveals several surgical scars.  The most  prominent is the longitudinal lumbar scar at the lumbosacral junction.  There is a draining sinus with hypertrophy and granulomatous type tissue.  The drainage is of a milky to clear fluid which is serous in nature.   After reviewing the CT scan and in light of the draining abscess, it was  felt that the patient needed all metal removed from his lumbar spine  including the bone stimulator.  A wide I&D of the abscess will be done.   The patient has hypertension.  He is being treated by Dr. Lavonda Jumbo up on  [facility] here in [place].   MEDICATIONS:  He is taking Toprol XL (he is unsure of the mg and he is  unsure of his other  antihypertensive medications.  I wrote him a  prescription today for Vicodin for his discomfort and Robaxin as a muscle  relaxant.   ALLERGIES:  He has no medical allergies.   PAST MEDICAL HISTORY:  He does have hypertension as mentioned above.   PAST SURGICAL HISTORY:  1.  Multiple back surgeries.  2.  Left total hip replacement arthroplasty in the past.  3.  Right total hip arthroplasty which had to be revised x 1.   FAMILY HISTORY:  Noncontributory.   SOCIAL HISTORY:  The patient is single, disabled and smokes one pack of  cigarettes per day (will try to cut back).  He admits to one beer per day.  He has two children.   REVIEW OF SYMPTOMS:  CNS:  No seizure disorder, paralysis, numbness, or  double vision.  The patient does have radiculitis consistent with present  illness.  He also has migraine headaches.  CARDIOVASCULAR:  No chest pain,  no angina, and no orthopnea.  RESPIRATORY:  No productive cough, no  hemoptysis, no shortness of breath.  GASTROINTESTINAL:  No nausea, vomiting,  melena, or bloody stools.  GENITOURINARY:  The patient has some nocturia  once or twice per night.  MUSCULOSKELETAL:  Primarily in present illness.   PHYSICAL EXAMINATION:  GENERAL:  Alert, cooperative, friendly, well-  appearing 65 year old white male who is walking with a cane in a slightly  hip flexed position.  VITAL SIGNS:  Blood pressure 220/100, pulse 76, respirations are 12.  HEENT:  Normocephalic and atraumatic.  PERRLA.  EOMs intact.  Oropharynx is  clear.  CHEST:  Clear to auscultation.  No rhonchi and no rales.  HEART:  Regular rate and rhythm.  No murmurs are heard.  ABDOMEN:  Soft and nontender.  Liver and spleen are not felt.  GENITOURINARY:  Not done, not pertinent to present illness.  RECTAL:  Not done, not pertinent to present illness.  EXTREMITIES:  Sensation decreased in the lower extremities, more so on the  left than the right.  Weakness is noted in the tibia anteriorly,  extensor  hallicis longus, as well as the quadriceps.  This is in the left lower  extremity.  Hoffman and Babinski signs are negative.   ADMISSION DIAGNOSES:  Status post multiple back surgeries with fusion and  now with lumbar abscess.   PLAN:  The patient will have removal of all the hardware, lumbar verterbrae-  5/sacral vertebrae-1 with removal of the spinal cord stimulator.  Exploration of the fusion and incision and drainage of the abscess in the  lumbosacral region.       DLU/MEDQ  D:  06/29/2004  T:  06/29/2004  Job:  [id number]   cc:   Lavonda Jumbo, M.D.  [address] [place], [place] [address]  Fax: [phone]

## 2011-02-02 NOTE — Op Note (Signed)
NAME:  Terry [name], Terry [name]                  ACCOUNT NO.:  [id number]   MEDICAL RECORD NO.:  [id number]          PATIENT TYPE:  OIB   LOCATION:  5711                         FACILITY:  [facility]   PHYSICIAN:  Sharolyn Douglas, M.D.        DATE OF BIRTH:  12-Jul-1946   DATE OF PROCEDURE:  DATE OF DISCHARGE:                                 OPERATIVE REPORT   PREOPERATIVE DIAGNOSES:  1.  Lumbar abscess.  2.  Status post multiple previous spinal surgeries with instrumentation L2-3      and L5-S1.  3.  Spinal cord stimulator.   PROCEDURES:  1.  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  2.  Removal of segmental spinal instrumentation L2-3 and L5-S1.  3.  Removal of implanted spinal cord stimulator IPG.  4.  Revision L3-4 laminectomy with exploration of the spinal canal for      infection.  5.  Exploration of spinal fusion.   SURGEON:  Sharolyn Douglas, M.D.   ASSISTANT:  Verlin Fester, P.A.   ANESTHESIA:  General endotracheal anesthesia.   COMPLICATIONS:  None.   INDICATIONS FOR PROCEDURE:  The patient is a very pleasant 65 year old male  who was referred to me by Dr. Ophelia Charter and Dr. Prince Rome.  He has had a complicated  history including 20 lumbar spinal procedures.  He has had recent drainage  from his lumbar wound with low grade fevers.  Dr. Ophelia Charter placed him on  clindamycin.  His radiographs and CT scan demonstrate instrumentation in  place at L2-3 and L5-S1.  He has spinal cord stimulator which enters the  spinal canal at the lower thoracic level.  Because of his infection, I have  talked with him about removal of the instrumentation and long-term  antibiotics to attempt to eradicate this.  He also has history of bilateral  total hip replacements.  I spoke to infectious disease, Dr. Roxan Hockey,  preoperatively and it was recommended that the patient be placed on Cipro  and vancomycin postoperatively until his cultures come back.  The patient  knows  the risks and benefits of surgery and elected to proceed.   DESCRIPTION OF PROCEDURE:  The patient was properly identified in the  holding area, taken to the operating room and underwent general endotracheal  anesthesia without difficulty, given prophylactic IV antibiotics.  He was  carefully positioned prone on the Wilson frame.  All bony prominences were  padded.  Face and eyes were protected at all times.  Back prepped and draped  in the usual sterile fashion.  The patient had multiple previous incisions  in his back.  There was a large lumbar incision extending from the lower  thoracic down to the S1-2 level.  There were several smaller satellite  incisions.  The IPG of the spinal cord stimulator could be easily palpated  on the left side of the patient's back.  There was a small area of drainage  from the inferior portion of the midline lumbar wound at approximately the  L5-S1  level.  The previous midline incision was opened up and the scar was  excised.  We immediately encountered a subcutaneous abscess pocket located  over the L5-S1 level where the drainage was.  This was completely debrided  of all infected appearing material.  There was an extensive amount of scar  tissue.  Essentially, there was no deep fascia, just a 3 cm layer of scar  tissue.  This was carried down to the level of the instrumentation and  fusion mass along the lateral gutters of L2 down to the sacrum.  We were  able to identify the pedicle screws on the right side at L2-3.  The screws  were removed and they appeared to be well fixed.  We then turned our  attention to uncovering the screws on the left side.  On this side, there  was substantial amount of bone graft overlying the plate and screws and this  had to be removed with osteotomes.  We performed a similar procedure on the  right side at L5-S1.  Again, there was robust fusion mass over the pedicle  screws and previously placed plate.  We did notice on the  right side that  there was purulent pus appearing material about the fusion mass as well as  the plate on the right at L5-S1.  On the left side at L5-S1 the bone was  clearly infected.  The pedicle screws were loose.  There was an abscess  pocket located around the instrumentation.  This was all debrided.  We then  irrigated the wound with three liters of normal saline using pulse lavage.  We debrided all necrotic appearing soft tissue.  We then located the edges  of the previous laminectomy and used curettes to dissect through the  epidural fibrosis.  We were able to enter the spinal canal with a hockey  stick probe and there did not appear to be any purulent material tracking  into the epidural space.  We then turned our attention to the spinal cord  stimulator.  The incision was opened up on the left side over the IPG.  Dissection was carried down to the spinal cord stimulator and the IPG was  removed.  There did not appear to be any infection around the IPG itself.  The stimulator was removed as well as the attached wire.  We did not make an  attempt to remove the spinal cord stimulator lead from the epidural space  which was entering higher in the thoracic spine.  There was no evidence of  infection about the IPG and the lumbar wound did not appear to communicate  with the stimulator lead in any way.  We then closed the wounds in layers  using monofilament PDS suture.  A nylon suture was used in a running fashion  to approximate the skin edges.  Sterile dressing was applied. The patient  was turned supine.  A PICC line will be placed and infectious disease will  be consulted.  Cultures were sent to pathology and the Gram stain showed  white blood cells but no organisms.  He will be maintained on Cipro 400 mg  IV b.i.d. as well as vancomycin until further recommendations are made by  the infectious disease service.      MC/MEDQ  D:  07/14/2004  T:  07/14/2004  Job:  [id number]

## 2011-02-02 NOTE — Discharge Summary (Signed)
NAMESYE, SCHROEPFER                  ACCOUNT NO.:  [id number]   MEDICAL RECORD NO.:  [id number]          PATIENT TYPE:  INP   LOCATION:  5711                         FACILITY:  [facility]   PHYSICIAN:  Verlin Fester, P.A.    DATE OF BIRTH:  05/02/1946   DATE OF ADMISSION:  07/13/2004  DATE OF DISCHARGE:  07/16/2004                                 DISCHARGE SUMMARY   ADMITTING DIAGNOSES:  1.  Lumbar abscess and infection, status post multiple back surgeries.  2.  Hypertension.   DISCHARGE DIAGNOSES:  1.  Status post hardware removal and I&D, lumbar spine.  2.  Status post PICC line placement and IV antibiotics.  3.  Hypertension.   PROCEDURE:  The patient was taken to the operating room on July 13, 2004,  for hardware removal and I&D of the lumbar spine.   SURGEON:  Patricia Nettle, MD.   ASSISTANT:  Colleen Mahar, PA-C.   ANESTHESIA:  General.   CONSULTATIONS:  Infectious Disease.   PREOPERATIVE LABS:  CBC with diff showed an RDW percent of 19.5, absolute  monos was 0.8, otherwise normal.  Postoperatively, he did develop anemia  with lows on October 29th of 2.89 RBC, hemoglobin 9, hematocrit of 25.9.  He  was stable, asymptomatic, and did not require any transfusions.  Preoperatively, PT, INR, and PTT within normal limits.  Complete metabolic  panel within normal limits with the exception of a sodium of 134, a glucose  of 112, and an ALT of 125.  Postoperatively, basic metabolic panel showed a  glucose of 123 and a calcium at 8.3, otherwise normal.  UA on October 25th  showed protein 30, nitrite positive, WBCs 21-50, RBCs 3-6, and bacteria too  numerous to count.  Gram stain of the wound showed moderate WBCs and RBCs,  no organisms.  Sensitivities were also completed.  See chart.  Anaerobic  culture sent intraoperatively showed no anaerobes.  EKG from October 27th  showed a normal sinus rhythm, possible left atrial enlargement, left  ventricular hypertrophy with repolarization  abnormality, no significant  change since last tracing read by Dr. [place] Bing.  X-rays were taken to  confirm PICC line placement.   BRIEF HISTORY:  Patient is a 65 year old male, who has had approximately 20  surgeries to his lumbosacral spine.  Dr. Sibyl Parr in [place] performed his  last surgery in 1998.  He has had a spinal cord stimulator placed and screws  as well as implants.  He had a battery pack changed three times.  His pain  in his back is now interfering with his day-to-day activity and including  his daily living.  He is walking with a cane.  He also began having drainage  from his wound and developed fevers and is not feeling well.  Dr. Prince Rome  initially saw him and referred him to Dr. Ophelia Charter, who began him on  Clindamycin.  When he was found to have the abscess on his low back, he was  referred to Dr. Noel Gerold for treatment.   HOSPITAL COURSE:  On July 13, 2004, the patient  was taken to the  operating room for the above-procedure, and he tolerated the procedure well  without any intraoperative complications.  Postoperatively, orthopedic spine  protocol was followed including advancing slowly to a regular diet as he  could tolerate, incentive spirometry q.1h, Hemovac drain charting output,  DVT prophylaxis through TED hose, SCDs, as well as early mobilization with  physical therapy and occupational therapy, IV antibiotics, Vancomycin, dose  per pharmacy as well as Cipro 400 mg IV b.i.d.  A PICC line was ordered for  placement as the patient will need home IV antibiotic treatment.  Home  medications were resumed.  Home pain medications were also started, and he  was placed on a PCA morphine initially postoperatively.  The patient did  well throughout his postoperative period.  Infectious Disease was consulted,  who assisted [place] with antibiotic selection and management.  A PICC line was  placed on July 14, 2004.  Patient's dressing did saturate through on  October 28th  and was reinforced by nursing.  Dressing changes were started  on October 29th.  The incision looked good without any active drainage.  Hemovac drains were dc 'd.  By July 16, 2004, the patient had met all  orthopedic goals.  He was stable and ready for discharge home.   DISCHARGE PLAN:  1.  Activity:  Daily ambulation, no lifting heavier than 5 pounds, brace on      when he is up, back precautions at all times.  2.  Wound care:  Daily dressing changes, keep the dressing dry and the      incision dry x5 days or until the drainage stops.  3.  Medications:  He will resume his home medications.  He will use Percocet      for pain as well as continue his home pain medications, and Robaxin for      muscle relaxant.  He will be on IV Vancomycin x4 weeks and Cipro p.o.      b.i.d.  4.  Diet:  Regular home diet.  5.  Followup:  Two weeks with Dr. Noel Gerold.  Call for an appointment.   CONDITION ON DISCHARGE:  Stable and improved.   DISPOSITION:  Patient is being discharged to his home with family assistance  as well as Home Health therapy.       CM/MEDQ  D:  09/06/2004  T:  09/06/2004  Job:  [id number]

## 2011-06-14 LAB — BASIC METABOLIC PANEL
CO2: 26
Calcium: 9.1
GFR calc Af Amer: >60
GFR calc non Af Amer: >60
Potassium: 3.3 — ABNORMAL LOW
Sodium: 138

## 2011-06-14 LAB — CBC
HCT: 31.2 — ABNORMAL LOW
Hemoglobin: 10.8 — ABNORMAL LOW
RBC: 3.44 — ABNORMAL LOW

## 2012-08-16 ENCOUNTER — Encounter (HOSPITAL_COMMUNITY): Payer: Self-pay | Admitting: Emergency Medicine

## 2012-08-16 ENCOUNTER — Inpatient Hospital Stay (HOSPITAL_COMMUNITY)
Admission: EM | Admit: 2012-08-16 | Discharge: 2012-08-22 | DRG: 492 | Disposition: A | Discharge status: Skilled Nursing Facility | Payer: 59 | Attending: Specialist | Admitting: Specialist

## 2012-08-16 ENCOUNTER — Emergency Department (HOSPITAL_COMMUNITY): Payer: 59

## 2012-08-16 DIAGNOSIS — I5033 Acute on chronic diastolic (congestive) heart failure: Secondary | ICD-10-CM | POA: Diagnosis present

## 2012-08-16 DIAGNOSIS — I10 Essential (primary) hypertension: Secondary | ICD-10-CM | POA: Diagnosis present

## 2012-08-16 DIAGNOSIS — IMO0002 Reserved for concepts with insufficient information to code with codable children: Secondary | ICD-10-CM

## 2012-08-16 DIAGNOSIS — I119 Hypertensive heart disease without heart failure: Secondary | ICD-10-CM

## 2012-08-16 DIAGNOSIS — F29 Unspecified psychosis not due to a substance or known physiological condition: Secondary | ICD-10-CM | POA: Diagnosis present

## 2012-08-16 DIAGNOSIS — S82301A Unspecified fracture of lower end of right tibia, initial encounter for closed fracture: Secondary | ICD-10-CM | POA: Diagnosis present

## 2012-08-16 DIAGNOSIS — D689 Coagulation defect, unspecified: Secondary | ICD-10-CM | POA: Diagnosis present

## 2012-08-16 DIAGNOSIS — N135 Crossing vessel and stricture of ureter without hydronephrosis: Secondary | ICD-10-CM | POA: Diagnosis present

## 2012-08-16 DIAGNOSIS — D62 Acute posthemorrhagic anemia: Secondary | ICD-10-CM

## 2012-08-16 DIAGNOSIS — S82839A Other fracture of upper and lower end of unspecified fibula, initial encounter for closed fracture: Secondary | ICD-10-CM

## 2012-08-16 DIAGNOSIS — E876 Hypokalemia: Secondary | ICD-10-CM | POA: Diagnosis not present

## 2012-08-16 DIAGNOSIS — I5032 Chronic diastolic (congestive) heart failure: Secondary | ICD-10-CM

## 2012-08-16 DIAGNOSIS — R791 Abnormal coagulation profile: Secondary | ICD-10-CM | POA: Diagnosis present

## 2012-08-16 DIAGNOSIS — E1169 Type 2 diabetes mellitus with other specified complication: Secondary | ICD-10-CM | POA: Diagnosis present

## 2012-08-16 DIAGNOSIS — I152 Hypertension secondary to endocrine disorders: Secondary | ICD-10-CM | POA: Diagnosis present

## 2012-08-16 DIAGNOSIS — E1151 Type 2 diabetes mellitus with diabetic peripheral angiopathy without gangrene: Secondary | ICD-10-CM

## 2012-08-16 DIAGNOSIS — E669 Obesity, unspecified: Secondary | ICD-10-CM

## 2012-08-16 DIAGNOSIS — G894 Chronic pain syndrome: Secondary | ICD-10-CM | POA: Diagnosis present

## 2012-08-16 DIAGNOSIS — I509 Heart failure, unspecified: Secondary | ICD-10-CM | POA: Diagnosis present

## 2012-08-16 DIAGNOSIS — D638 Anemia in other chronic diseases classified elsewhere: Secondary | ICD-10-CM | POA: Diagnosis present

## 2012-08-16 DIAGNOSIS — M519 Unspecified thoracic, thoracolumbar and lumbosacral intervertebral disc disorder: Secondary | ICD-10-CM

## 2012-08-16 DIAGNOSIS — E1149 Type 2 diabetes mellitus with other diabetic neurological complication: Secondary | ICD-10-CM | POA: Diagnosis present

## 2012-08-16 DIAGNOSIS — E1142 Type 2 diabetes mellitus with diabetic polyneuropathy: Secondary | ICD-10-CM | POA: Diagnosis present

## 2012-08-16 DIAGNOSIS — F17200 Nicotine dependence, unspecified, uncomplicated: Secondary | ICD-10-CM | POA: Diagnosis present

## 2012-08-16 DIAGNOSIS — I2789 Other specified pulmonary heart diseases: Secondary | ICD-10-CM | POA: Diagnosis present

## 2012-08-16 DIAGNOSIS — J4489 Other specified chronic obstructive pulmonary disease: Secondary | ICD-10-CM | POA: Diagnosis present

## 2012-08-16 DIAGNOSIS — W07XXXA Fall from chair, initial encounter: Secondary | ICD-10-CM | POA: Diagnosis present

## 2012-08-16 DIAGNOSIS — E1159 Type 2 diabetes mellitus with other circulatory complications: Secondary | ICD-10-CM | POA: Diagnosis present

## 2012-08-16 DIAGNOSIS — R799 Abnormal finding of blood chemistry, unspecified: Secondary | ICD-10-CM | POA: Diagnosis present

## 2012-08-16 DIAGNOSIS — J449 Chronic obstructive pulmonary disease, unspecified: Secondary | ICD-10-CM

## 2012-08-16 DIAGNOSIS — I517 Cardiomegaly: Secondary | ICD-10-CM | POA: Diagnosis present

## 2012-08-16 DIAGNOSIS — G8929 Other chronic pain: Secondary | ICD-10-CM

## 2012-08-16 DIAGNOSIS — N179 Acute kidney failure, unspecified: Secondary | ICD-10-CM

## 2012-08-16 DIAGNOSIS — Z6826 Body mass index (BMI) 26.0-26.9, adult: Secondary | ICD-10-CM

## 2012-08-16 DIAGNOSIS — D649 Anemia, unspecified: Secondary | ICD-10-CM

## 2012-08-16 DIAGNOSIS — I279 Pulmonary heart disease, unspecified: Secondary | ICD-10-CM | POA: Diagnosis present

## 2012-08-16 DIAGNOSIS — I739 Peripheral vascular disease, unspecified: Secondary | ICD-10-CM | POA: Diagnosis present

## 2012-08-16 DIAGNOSIS — S82899A Other fracture of unspecified lower leg, initial encounter for closed fracture: Principal | ICD-10-CM | POA: Diagnosis present

## 2012-08-16 DIAGNOSIS — J9620 Acute and chronic respiratory failure, unspecified whether with hypoxia or hypercapnia: Secondary | ICD-10-CM | POA: Diagnosis not present

## 2012-08-16 HISTORY — DX: Hypertensive heart disease without heart failure: I11.9

## 2012-08-16 HISTORY — DX: Obesity, unspecified: E66.9

## 2012-08-16 HISTORY — DX: Essential (primary) hypertension: I10

## 2012-08-16 HISTORY — DX: Unspecified thoracic, thoracolumbar and lumbosacral intervertebral disc disorder: M51.9

## 2012-08-16 HISTORY — DX: Type 2 diabetes mellitus without complications: E11.9

## 2012-08-16 HISTORY — DX: Peripheral vascular disease, unspecified: I73.9

## 2012-08-16 LAB — URINALYSIS, ROUTINE W REFLEX MICROSCOPIC
Glucose, UA: NEGATIVE mg/dL
Ketones, ur: NEGATIVE mg/dL
Protein, ur: 100 mg/dL — AB

## 2012-08-16 LAB — CBC WITH DIFFERENTIAL/PLATELET
Basophils Relative: 1 % (ref 0–1)
Eosinophils Relative: 1 % (ref 0–5)
Hemoglobin: 10.7 g/dL — ABNORMAL LOW (ref 13.0–17.0)
Lymphs Abs: 2 10*3/uL (ref 0.7–4.0)
MCH: 24.9 pg — ABNORMAL LOW (ref 26.0–34.0)
MCHC: 30.2 g/dL (ref 30.0–36.0)
MCV: 82.5 fL (ref 78.0–100.0)
Monocytes Absolute: 1.1 10*3/uL — ABNORMAL HIGH (ref 0.1–1.0)
Neutro Abs: 4.9 10*3/uL (ref 1.7–7.7)
RBC: 4.29 MIL/uL (ref 4.22–5.81)

## 2012-08-16 LAB — COMPREHENSIVE METABOLIC PANEL
ALT: 54 U/L — ABNORMAL HIGH (ref 0–53)
BUN: 50 mg/dL — ABNORMAL HIGH (ref 6–23)
CO2: 26 mEq/L (ref 19–32)
Calcium: 8.8 mg/dL (ref 8.4–10.5)
Creatinine, Ser: 1.48 mg/dL — ABNORMAL HIGH (ref 0.50–1.35)
GFR calc Af Amer: 55 mL/min — ABNORMAL LOW (ref >90)
GFR calc non Af Amer: 47 mL/min — ABNORMAL LOW (ref >90)
Glucose, Bld: 81 mg/dL (ref 70–99)

## 2012-08-16 LAB — URINE MICROSCOPIC-ADD ON

## 2012-08-16 LAB — PROTIME-INR
INR: 1.51 — ABNORMAL HIGH (ref 0.00–1.49)
Prothrombin Time: 17.8 seconds — ABNORMAL HIGH (ref 11.6–15.2)

## 2012-08-16 MED ORDER — FENTANYL CITRATE 0.05 MG/ML IJ SOLN
50.0000 ug | Freq: Once | INTRAMUSCULAR | Status: AC
  Administered 2012-08-16: 50 ug via INTRAVENOUS

## 2012-08-16 MED ORDER — HYDROMORPHONE HCL PF 1 MG/ML IJ SOLN
INTRAMUSCULAR | Status: AC
  Administered 2012-08-16: 0.5 mg via INTRAVENOUS
  Filled 2012-08-16: qty 1

## 2012-08-16 MED ORDER — SODIUM CHLORIDE 0.9 % IV SOLN
Freq: Once | INTRAVENOUS | Status: AC
  Administered 2012-08-16: 17:00:00 via INTRAVENOUS

## 2012-08-16 MED ORDER — SODIUM CHLORIDE 0.9 % IV SOLN
INTRAVENOUS | Status: DC
  Administered 2012-08-17: 04:00:00 via INTRAVENOUS

## 2012-08-16 MED ORDER — WHITE PETROLATUM GEL
Status: AC
  Filled 2012-08-16: qty 5

## 2012-08-16 MED ORDER — HYDROMORPHONE HCL PF 1 MG/ML IJ SOLN
0.5000 mg | Freq: Once | INTRAMUSCULAR | Status: AC
  Administered 2012-08-16: 0.5 mg via INTRAVENOUS
  Filled 2012-08-16: qty 1

## 2012-08-16 MED ORDER — FENTANYL CITRATE 0.05 MG/ML IJ SOLN
INTRAMUSCULAR | Status: AC
  Filled 2012-08-16: qty 2

## 2012-08-16 MED ORDER — INSULIN ASPART 100 UNIT/ML ~~LOC~~ SOLN: 0.0000 [IU] | SUBCUTANEOUS | Status: DC

## 2012-08-16 MED ORDER — HYDROMORPHONE HCL PF 1 MG/ML IJ SOLN
0.5000 mg | Freq: Once | INTRAMUSCULAR | Status: AC
  Administered 2012-08-16: 0.5 mg via INTRAVENOUS

## 2012-08-16 MED ORDER — SODIUM CHLORIDE 0.9 % IV SOLN
INTRAVENOUS | Status: DC
  Administered 2012-08-16: 18:00:00 via INTRAVENOUS

## 2012-08-16 NOTE — ED Notes (Signed)
Orthopedic surgeon at bedside. 

## 2012-08-16 NOTE — Anesthesia Preprocedure Evaluation (Addendum)
Anesthesia Evaluation  Patient identified by MRN, date of birth, ID band Patient awake    Reviewed: Allergy & Precautions, H&P , NPO status , Patient's Chart, lab work & pertinent test results, reviewed documented beta blocker date and time   Airway Mallampati: I TM Distance: >3 FB Neck ROM: Full    Dental  (+) Dental Advisory Given and Edentulous Lower   Pulmonary Current Smoker,  breath sounds clear to auscultation  Pulmonary exam normal       Cardiovascular hypertension, Pt. on medications and Pt. on home beta blockers - CAD and - Past MI Rhythm:Regular Rate:Normal     Neuro/Psych negative neurological ROS  negative psych ROS   GI/Hepatic negative GI ROS, Neg liver ROS,   Endo/Other  diabetes, Poorly Controlled, Type 2, Oral Hypoglycemic Agents  Renal/GU negative Renal ROS     Musculoskeletal negative musculoskeletal ROS (+)   Abdominal   Peds  Hematology negative hematology ROS (+)   Anesthesia Other Findings   Reproductive/Obstetrics                         Anesthesia Physical Anesthesia Plan  ASA: III  Anesthesia Plan: General   Post-op Pain Management:    Induction: Intravenous  Airway Management Planned: Oral ETT  Additional Equipment:   Intra-op Plan:   Post-operative Plan: Extubation in OR  Informed Consent: I have reviewed the patients History and Physical, chart, labs and discussed the procedure including the risks, benefits and alternatives for the proposed anesthesia with the patient or authorized representative who has indicated his/her understanding and acceptance.   Dental advisory given  Plan Discussed with: CRNA  Anesthesia Plan Comments:         Anesthesia Quick Evaluation

## 2012-08-16 NOTE — H&P (Addendum)
Terry [name] is an 66 y.o. male.   Chief Complaint: Right leg pain HPI: 66 year old male was bending over in his office chair when the chair flipped and he fell onto the  Right side. With immediate pain and inability to bear weight on the right leg. Brought by EMS to [facility] ER where xrays show a right distal tibia oblique spiral fracture at the distal 1/3 diaphyseal area with some comminution. This patient is a ambulator within his household using a walker. He describes shortness of breath with ambulation any great distance.  Past Medical History  Diagnosis Date  . Diabetes mellitus without complication   . Hypertension     Past Surgical History  Procedure Date  . Back surgery     numerous spine surgeries, 1966-2011  . Joint replacement     bilateral, each replaced twice    No family history on file. Social History:  reports that he has been smoking Cigarettes.  He has been smoking about 1 pack per day. He has never used smokeless tobacco. He reports that he drinks alcohol. He reports that he does not use illicit drugs.  Allergies: No Known Allergies   (Not in a hospital admission)  No results found for this or any previous visit (from the past 48 hour(s)). Dg Tibia/fibula Right  08/16/2012  *RADIOLOGY REPORT*  Clinical Data: Larey Seat out of a wheelchair and injured the right lower leg and ankle.  RIGHT TIBIA AND FIBULA - 2 VIEW  Comparison: Right ankle x-rays obtained concurrently.  Findings: Oblique comminuted fracture involving the distal tibial metaphysis.  Nondisplaced fractures extending more inferiorly in the metaphysis, though these do not extend to the articular surface.  No fractures elsewhere involving the tibia.  Nondisplaced fracture involving the proximal fibular metaphysis along its medial cortical surface.  Chondrocalcinosis in the medial meniscus of the knee.  Mild medial compartment joint space narrowing.  IMPRESSION:  1.  Oblique, comminuted fracture involving the distal  tibial metaphysis with nondisplaced fractures more distally in the metaphysis that do not extend to the articular surface. 2.  Nondisplaced fracture involving the medial cortex of the proximal fibular metaphysis. 3.  CPPD arthropathy involving the knee.   Original Report Authenticated By: Hulan Saas, M.D.    Dg Ankle Complete Right  08/16/2012  *RADIOLOGY REPORT*  Clinical Data: Larey Seat out of a wheelchair and injured the right lower leg and ankle.  RIGHT ANKLE - COMPLETE 3+ VIEW  Comparison: Right tibia-fibula x-rays obtained concurrently.  Findings: Comminuted oblique fracture involving the distal tibial metaphysis, with a vertically oriented linear fracture extending more distally as well as a nondisplaced oblique fracture in the opposite direction more distally.  The fracture line does not appear to involve the articular surface.  No fractures about the ankle joint proper.  Ankle mortise intact.  Calcification in the soft tissues posterior to the ankle joint which are likely vascular.  IMPRESSION: Oblique, comminuted fracture involving the distal tibial metaphysis with nondisplaced fractures more distally in the metaphysis that do not extend to the articular surface. No other fractures.   Original Report Authenticated By: Hulan Saas, M.D.     Review of Systems  Constitutional: Positive for malaise/fatigue. Negative for fever, chills, weight loss and diaphoresis.  HENT: Negative for hearing loss, ear pain, nosebleeds, congestion, sore throat, neck pain, tinnitus and ear discharge.   Eyes: Positive for discharge. Negative for blurred vision, double vision, photophobia, pain and redness.  Respiratory: Positive for shortness of breath. Negative for  cough, hemoptysis, sputum production, wheezing and stridor.   Cardiovascular: Positive for orthopnea, claudication, leg swelling and PND. Negative for chest pain and palpitations.  Gastrointestinal: Negative for heartburn, nausea, vomiting, abdominal  pain, diarrhea, constipation, blood in stool and melena.  Genitourinary: Positive for frequency. Negative for dysuria, urgency, hematuria and flank pain.  Musculoskeletal: Positive for back pain, joint pain and falls. Negative for myalgias.  Skin: Negative for itching and rash.  Neurological: Positive for tingling, sensory change and weakness. Negative for dizziness, tremors, speech change, focal weakness, seizures, loss of consciousness and headaches.  Endo/Heme/Allergies: Negative for environmental allergies and polydipsia. Does not bruise/bleed easily.  Psychiatric/Behavioral: Negative for depression, suicidal ideas, hallucinations, memory loss and substance abuse. The patient has insomnia. The patient is not nervous/anxious.     Blood pressure 161/74, pulse 94, temperature 99 F (37.2 C), temperature source Oral, resp. rate 16, SpO2 71.00%. Physical Exam  Constitutional: He is oriented to person, place, and time. He appears well-developed and well-nourished. No distress.  HENT:  Head: Normocephalic and atraumatic.  Right Ear: External ear normal.  Left Ear: External ear normal.  Nose: Nose normal.  Mouth/Throat: Oropharynx is clear and moist. No oropharyngeal exudate.  Eyes: Pupils are equal, round, and reactive to light. Right eye exhibits discharge. Left eye exhibits discharge. No scleral icterus.  Neck: Normal range of motion. No JVD present. No tracheal deviation present. No thyromegaly present.  Cardiovascular: Normal rate, regular rhythm, normal heart sounds and intact distal pulses.  Exam reveals no gallop and no friction rub.   No murmur heard. Respiratory: Effort normal and breath sounds normal. No stridor. No respiratory distress. He has no wheezes. He has no rales. He exhibits no tenderness.  GI: Soft. Bowel sounds are normal. He exhibits no distension and no mass. There is no tenderness. There is no rebound and no guarding.  Musculoskeletal: He exhibits edema and tenderness.    Lymphadenopathy:    He has no cervical adenopathy.  Neurological: He is alert and oriented to person, place, and time. He has normal reflexes. He displays normal reflexes. No cranial nerve deficit. He exhibits normal muscle tone. Coordination normal.  Skin: Skin is warm and dry. No rash noted. He is not diaphoretic. No erythema.  Psychiatric: He has a normal mood and affect. His behavior is normal. Judgment and thought content normal.    Orthopedic evaluation: Large individual moderately obese. Bilateral lower extremities with shiny skin from knees downwards swelling of feet. Dorsalis pedis pulse trace trace posterior tibialis pulse trace trace. Sensation diminished both feet. Right foot is actually rotated 45 compare with the left 15. No angular deformity grossly of the distal tibia. Mild swelling right distal half of the right tibia. Compartments are soft posteriorly laterally and anteriorly right leg. Sensation intact from the right ankle proximally. X-rays right tib-fib demonstrate a right distal diaphyseal metaphyseal fracture oblique spiral of comminution. Some hairline fracture extensions distally not intra-articular. Proximal nondisplaced fibular neck fracture. The oblique spiral fracture is displaced for 5 mm laterally. This patient is also tender over the right medial knee with pain with stressing of the medial collateral ligament. Radiographs of the right tibia-fibula show no bony injury or acute change in the area of this patient's right knee.  Assessment/Plan 1) Right distal one third tibial diaphyseal-metaphyseal fracture comminuted some hairline extension distally. Proximal right fibular neck fracture nondisplaced. In the face of diabetes peripheral vascular disease and trophic skin changes patient is not a candidate for an extensile distal leg surgery.  We'll plan to perform closed reduction internal fixation with intramedullary nail and interlocking screws hopefully able to engage  distal fragment and stabilized in good position alignment. This was done in order to avoid skin concerns over the distal leg. Risks of surgery including infection bleeding neurologic compromise discussed with the patient. 2) Diabetes mellitus controlled on oral medications. Peripheral vascular disease. 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 While in the hospital we'll ask for [facility] hospitalists to see this patient to assist in maintenance diabetes if necessary. 3) Due to size and current limitations of ability to walk prior to falling I am concern that he may require a short term stay at SNF during his recuperation from the right leg surgery due to limited mobility. 4) Right medial collateral ligament sprain. Niesha Bame E 08/16/2012, 7:46 PM

## 2012-08-16 NOTE — ED Provider Notes (Signed)
History     CSN: [id number]  Arrival date & time 08/16/12  1631   First MD Initiated Contact with Patient 08/16/12 1725      Chief Complaint  Patient presents with  . Leg Injury    (Consider location/radiation/quality/duration/timing/severity/associated sxs/prior treatment) HPI The patient presents after a fall.  He was sitting in a chair, spine, twisted, felt his right lower leg twisted beneath him.  Since that time there's been pain persistently in the right distal leg.  The pain is sharp, worse with motion.  The patient has not ambulated since the event.  She denies other trauma, other focal concerns, new complaints. He states that he was in his usual state of health prior to the onset of symptoms. The patient has a history of bilateral hip repair, but no prior surgical interventions of the right distal extremity. Past Medical History  Diagnosis Date  . Diabetes mellitus without complication   . Hypertension     Past Surgical History  Procedure Date  . Back surgery     numerous spine surgeries, 1966-2011  . Joint replacement     bilateral, each replaced twice    No family history on file.  History  Substance Use Topics  . Smoking status: Current Every Day Smoker -- 1.0 packs/day    Types: Cigarettes  . Smokeless tobacco: Never Used  . Alcohol Use: Yes     Comment: rarely      Review of Systems  Constitutional:       Per HPI, otherwise negative  HENT:       Per HPI, otherwise negative  Eyes: Negative.   Respiratory:       Per HPI, otherwise negative  Cardiovascular:       Per HPI, otherwise negative  Gastrointestinal: Negative for vomiting.  Genitourinary: Negative.   Musculoskeletal:       Per HPI, otherwise negative  Skin: Negative.   Neurological: Negative for syncope.    Allergies  Review of patient's allergies indicates no known allergies.  Home Medications   Current Outpatient Rx  Name  Route  Sig  Dispense  Refill  . LOSARTAN  POTASSIUM-HCTZ 100-25 MG PO TABS   Oral   Take 1 tablet by mouth daily.         Marland Kitchen METFORMIN HCL PO   Oral   Take 1 tablet by mouth 3 (three) times daily.         Marland Kitchen METOPROLOL TARTRATE 50 MG PO TABS   Oral   Take 50 mg by mouth 2 (two) times daily.         . OXYCODONE HCL 5 MG PO CAPS   Oral   Take 20 mg by mouth 3 (three) times daily as needed. For pain         . OXYCODONE HCL ER 40 MG PO TB12   Oral   Take 40 mg by mouth every 12 (twelve) hours.           BP 161/74  Pulse 94  Temp 99 F (37.2 C) (Oral)  Resp 16  SpO2 71%  Physical Exam  Nursing note and vitals reviewed. Constitutional: He is oriented to person, place, and time. He appears well-developed. No distress.  HENT:  Head: Normocephalic and atraumatic.  Eyes: Conjunctivae normal and EOM are normal.  Cardiovascular: Normal rate and regular rhythm.   Pulmonary/Chest: Effort normal. No stridor. No respiratory distress.  Abdominal: He exhibits no distension.  Musculoskeletal: He exhibits no edema.  Neurological:  He is alert and oriented to person, place, and time.  Skin: Skin is warm and dry.  Psychiatric: He has a normal mood and affect.    ED Course  Procedures (including critical care time)  Labs Reviewed - No data to display No results found.   No diagnosis found.  O2- 91% - abnormal > with increased analgesia, the patient improves, though not substantially.    I discussed the XR findings with our orthopedist.  MDM  This patient presents with persistent pain in his right leg following an awkward twist while falling.  On exam he is in no distress.  The patient has tibia and fibula fractures.  I discussed the case with our orthopedist.  The patient we admitted for further evaluation and management.    Gerhard Munch, MD 08/16/12 [phone]

## 2012-08-16 NOTE — Progress Notes (Signed)
Orthopedic Tech Progress Note Patient Details:  Terry [name] 06/12/1946 [id number] Post. Short leg splint applied to Right LE with assistance of Dr. Otelia Sergeant in ED. Verbal order for splint. Patient to go to surgery tonight. Ortho Devices Type of Ortho Device: Short leg splint Ortho Device/Splint Location: Right Ortho Device/Splint Interventions: Application   Asia R Thompson 08/16/2012, 8:25 PM

## 2012-08-16 NOTE — ED Notes (Signed)
Per EMS - pt at home attempting to sit in desk type chair, chair rolled and pt sat on floor with right lower leg beneath his body. Pt still on the floor upon arrival of EMS. Immobilized right LE at seen, pulses presents per EMS.  BP - 191/91, HR - 99, Resp 20, O2 SAT 67% on RA, placed on 4L, O2 SAT 91%.   #18 right forearm, received Fentanyl 250 mcg en route to ED. Continues to rate pain at a 7/10.

## 2012-08-16 NOTE — ED Notes (Signed)
Wife states that before this fall today pt was normal although O2 SAT extremely low on arrival to ED, not on @ at home.

## 2012-08-17 ENCOUNTER — Encounter (HOSPITAL_COMMUNITY): Payer: Self-pay | Admitting: Anesthesiology

## 2012-08-17 ENCOUNTER — Emergency Department (HOSPITAL_COMMUNITY): Payer: 59

## 2012-08-17 ENCOUNTER — Encounter (HOSPITAL_COMMUNITY)
Admission: EM | Disposition: A | Discharge status: Skilled Nursing Facility | Payer: Self-pay | Source: Home / Self Care | Attending: Specialist

## 2012-08-17 ENCOUNTER — Inpatient Hospital Stay (HOSPITAL_COMMUNITY): Payer: 59

## 2012-08-17 ENCOUNTER — Encounter (HOSPITAL_COMMUNITY): Payer: Self-pay

## 2012-08-17 ENCOUNTER — Emergency Department (HOSPITAL_COMMUNITY): Payer: 59 | Admitting: Anesthesiology

## 2012-08-17 DIAGNOSIS — D689 Coagulation defect, unspecified: Secondary | ICD-10-CM | POA: Diagnosis present

## 2012-08-17 DIAGNOSIS — D638 Anemia in other chronic diseases classified elsewhere: Secondary | ICD-10-CM | POA: Diagnosis present

## 2012-08-17 DIAGNOSIS — N179 Acute kidney failure, unspecified: Secondary | ICD-10-CM | POA: Diagnosis present

## 2012-08-17 HISTORY — PX: TIBIA IM NAIL INSERTION: SHX2516

## 2012-08-17 LAB — GLUCOSE, CAPILLARY
Glucose-Capillary: 92 mg/dL (ref 70–99)
Glucose-Capillary: 106 mg/dL — ABNORMAL HIGH (ref 70–99)
Glucose-Capillary: 256 mg/dL — ABNORMAL HIGH (ref 70–99)
Glucose-Capillary: 133 mg/dL — ABNORMAL HIGH (ref 70–99)

## 2012-08-17 LAB — BLOOD GAS, ARTERIAL
Bicarbonate: 23.6 mEq/L (ref 20.0–24.0)
O2 Saturation: 94.1 %
Patient temperature: 98.4
TCO2: 22.2 mmol/L (ref 0–100)
pO2, Arterial: 77.9 mmHg — ABNORMAL LOW (ref 80.0–100.0)

## 2012-08-17 LAB — COMPREHENSIVE METABOLIC PANEL
AST: 49 U/L — ABNORMAL HIGH (ref 0–37)
Albumin: 2.9 g/dL — ABNORMAL LOW (ref 3.5–5.2)
Alkaline Phosphatase: 103 U/L (ref 39–117)
BUN: 41 mg/dL — ABNORMAL HIGH (ref 6–23)
CO2: 25 mEq/L (ref 19–32)
Chloride: 102 mEq/L (ref 96–112)
Creatinine, Ser: 1.25 mg/dL (ref 0.50–1.35)
GFR calc non Af Amer: 58 mL/min — ABNORMAL LOW (ref >90)
Potassium: 3.7 mEq/L (ref 3.5–5.1)
Total Bilirubin: 0.5 mg/dL (ref 0.3–1.2)

## 2012-08-17 LAB — RAPID URINE DRUG SCREEN, HOSP PERFORMED
Barbiturates: NOT DETECTED
Benzodiazepines: NOT DETECTED
Cocaine: NOT DETECTED
Tetrahydrocannabinol: NOT DETECTED

## 2012-08-17 LAB — TROPONIN I: Troponin I: 0.55 ng/mL (ref <0.30)

## 2012-08-17 LAB — MRSA PCR SCREENING: MRSA by PCR: NEGATIVE

## 2012-08-17 SURGERY — INSERTION, INTRAMEDULLARY ROD, TIBIA
Anesthesia: General | Site: Leg Lower | Laterality: Right | Wound class: Clean

## 2012-08-17 MED ORDER — MORPHINE SULFATE 10 MG/ML IJ SOLN
2.0000 mg | INTRAMUSCULAR | Status: DC | PRN
  Administered 2012-08-17 – 2012-08-19 (×9): 2 mg via INTRAVENOUS
  Filled 2012-08-17 (×8): qty 1

## 2012-08-17 MED ORDER — CEFAZOLIN SODIUM 1-5 GM-% IV SOLN
INTRAVENOUS | Status: AC
  Filled 2012-08-17: qty 50

## 2012-08-17 MED ORDER — POLYETHYLENE GLYCOL 3350 17 G PO PACK
17.0000 g | PACK | Freq: Every day | ORAL | Status: DC | PRN
  Filled 2012-08-17: qty 1

## 2012-08-17 MED ORDER — ACETAMINOPHEN 10 MG/ML IV SOLN: 1000.0000 mg | Freq: Once | INTRAVENOUS | Status: DC | PRN

## 2012-08-17 MED ORDER — FENTANYL CITRATE 0.05 MG/ML IJ SOLN
INTRAMUSCULAR | Status: DC | PRN
  Administered 2012-08-17 (×2): 75 ug via INTRAVENOUS

## 2012-08-17 MED ORDER — POTASSIUM CHLORIDE IN NACL 20-0.9 MEQ/L-% IV SOLN: INTRAVENOUS | Status: DC

## 2012-08-17 MED ORDER — DOCUSATE SODIUM 100 MG PO CAPS
100.0000 mg | ORAL_CAPSULE | Freq: Two times a day (BID) | ORAL | Status: DC
  Administered 2012-08-17 – 2012-08-22 (×8): 100 mg via ORAL
  Filled 2012-08-17 (×12): qty 1

## 2012-08-17 MED ORDER — BIOTENE DRY MOUTH MT LIQD
15.0000 mL | Freq: Two times a day (BID) | OROMUCOSAL | Status: DC
  Administered 2012-08-17 – 2012-08-22 (×10): 15 mL via OROMUCOSAL

## 2012-08-17 MED ORDER — OXYCODONE HCL 5 MG PO TABS: 5.0000 mg | ORAL_TABLET | Freq: Once | ORAL | Status: DC | PRN

## 2012-08-17 MED ORDER — INSULIN ASPART 100 UNIT/ML ~~LOC~~ SOLN
0.0000 [IU] | Freq: Three times a day (TID) | SUBCUTANEOUS | Status: DC
  Administered 2012-08-17: 8 [IU] via SUBCUTANEOUS
  Administered 2012-08-18: 2 [IU] via SUBCUTANEOUS
  Administered 2012-08-18: 3 [IU] via SUBCUTANEOUS
  Administered 2012-08-18: 2 [IU] via SUBCUTANEOUS
  Administered 2012-08-19: 3 [IU] via SUBCUTANEOUS
  Administered 2012-08-19: 2 [IU] via SUBCUTANEOUS
  Administered 2012-08-19: 3 [IU] via SUBCUTANEOUS
  Administered 2012-08-20: 2 [IU] via SUBCUTANEOUS
  Administered 2012-08-20: 5 [IU] via SUBCUTANEOUS
  Administered 2012-08-21 (×2): 2 [IU] via SUBCUTANEOUS
  Administered 2012-08-21 – 2012-08-22 (×2): 3 [IU] via SUBCUTANEOUS

## 2012-08-17 MED ORDER — ZOLPIDEM TARTRATE 5 MG PO TABS
5.0000 mg | ORAL_TABLET | Freq: Every evening | ORAL | Status: DC | PRN
  Administered 2012-08-22: 5 mg via ORAL
  Filled 2012-08-17: qty 1

## 2012-08-17 MED ORDER — CEFAZOLIN SODIUM 10 G IJ SOLR
3.0000 g | INTRAMUSCULAR | Status: DC | PRN
  Administered 2012-08-17: 3 g via INTRAVENOUS

## 2012-08-17 MED ORDER — HYDROMORPHONE HCL PF 1 MG/ML IJ SOLN
INTRAMUSCULAR | Status: AC
  Filled 2012-08-17: qty 1

## 2012-08-17 MED ORDER — HYDROCHLOROTHIAZIDE 25 MG PO TABS
25.0000 mg | ORAL_TABLET | Freq: Every day | ORAL | Status: DC
  Administered 2012-08-17 – 2012-08-18 (×2): 25 mg via ORAL
  Filled 2012-08-17 (×3): qty 1

## 2012-08-17 MED ORDER — CEFAZOLIN SODIUM-DEXTROSE 2-3 GM-% IV SOLR
INTRAVENOUS | Status: AC
  Filled 2012-08-17: qty 50

## 2012-08-17 MED ORDER — 0.9 % SODIUM CHLORIDE (POUR BTL) OPTIME
TOPICAL | Status: DC | PRN
  Administered 2012-08-17: 1000 mL

## 2012-08-17 MED ORDER — FUROSEMIDE 10 MG/ML IJ SOLN
40.0000 mg | INTRAMUSCULAR | Status: DC
  Administered 2012-08-17 – 2012-08-18 (×2): 40 mg via INTRAVENOUS
  Filled 2012-08-17 (×3): qty 4

## 2012-08-17 MED ORDER — ONDANSETRON HCL 4 MG/2ML IJ SOLN
INTRAMUSCULAR | Status: DC | PRN
  Administered 2012-08-17: 4 mg via INTRAVENOUS

## 2012-08-17 MED ORDER — PROPOFOL 10 MG/ML IV BOLUS
INTRAVENOUS | Status: DC | PRN
  Administered 2012-08-17: 150 mg via INTRAVENOUS

## 2012-08-17 MED ORDER — ONDANSETRON HCL 4 MG/2ML IJ SOLN: 4.0000 mg | Freq: Four times a day (QID) | INTRAMUSCULAR | Status: DC | PRN

## 2012-08-17 MED ORDER — METOCLOPRAMIDE HCL 5 MG PO TABS
5.0000 mg | ORAL_TABLET | Freq: Three times a day (TID) | ORAL | Status: DC | PRN
  Filled 2012-08-17: qty 2

## 2012-08-17 MED ORDER — LACTATED RINGERS IV SOLN
INTRAVENOUS | Status: DC | PRN
  Administered 2012-08-17: 01:00:00 via INTRAVENOUS

## 2012-08-17 MED ORDER — METOPROLOL TARTRATE 50 MG PO TABS
50.0000 mg | ORAL_TABLET | Freq: Two times a day (BID) | ORAL | Status: DC
  Administered 2012-08-17: 50 mg via ORAL
  Filled 2012-08-17 (×2): qty 1

## 2012-08-17 MED ORDER — OXYCODONE HCL ER 40 MG PO T12A
40.0000 mg | EXTENDED_RELEASE_TABLET | Freq: Two times a day (BID) | ORAL | Status: DC
  Administered 2012-08-17 – 2012-08-22 (×11): 40 mg via ORAL
  Filled 2012-08-17: qty 1
  Filled 2012-08-17 (×2): qty 2
  Filled 2012-08-17 (×2): qty 1
  Filled 2012-08-17: qty 2
  Filled 2012-08-17: qty 1
  Filled 2012-08-17: qty 2
  Filled 2012-08-17 (×2): qty 1
  Filled 2012-08-17: qty 2

## 2012-08-17 MED ORDER — KETAMINE HCL 10 MG/ML IJ SOLN
INTRAMUSCULAR | Status: DC | PRN
  Administered 2012-08-17 (×5): 10 mg via INTRAVENOUS

## 2012-08-17 MED ORDER — METHOCARBAMOL 100 MG/ML IJ SOLN
500.0000 mg | Freq: Four times a day (QID) | INTRAVENOUS | Status: DC | PRN
  Administered 2012-08-17 – 2012-08-21 (×2): 500 mg via INTRAVENOUS
  Filled 2012-08-17 (×3): qty 5

## 2012-08-17 MED ORDER — METOPROLOL TARTRATE 100 MG PO TABS
100.0000 mg | ORAL_TABLET | Freq: Two times a day (BID) | ORAL | Status: DC
Start: 2012-08-17 — End: 2012-08-22
  Administered 2012-08-17 – 2012-08-22 (×10): 100 mg via ORAL
  Filled 2012-08-17 (×11): qty 1

## 2012-08-17 MED ORDER — GLYCOPYRROLATE 0.2 MG/ML IJ SOLN
INTRAMUSCULAR | Status: DC | PRN
  Administered 2012-08-17: 0.3 mg via INTRAVENOUS

## 2012-08-17 MED ORDER — PNEUMOCOCCAL VAC POLYVALENT 25 MCG/0.5ML IJ INJ
0.5000 mL | INJECTION | INTRAMUSCULAR | Status: AC
  Administered 2012-08-18: 0.5 mL via INTRAMUSCULAR
  Filled 2012-08-17: qty 0.5

## 2012-08-17 MED ORDER — SUCCINYLCHOLINE CHLORIDE 20 MG/ML IJ SOLN
INTRAMUSCULAR | Status: DC | PRN
  Administered 2012-08-17: 160 mg via INTRAVENOUS

## 2012-08-17 MED ORDER — ACETAMINOPHEN 10 MG/ML IV SOLN
INTRAVENOUS | Status: DC | PRN
  Administered 2012-08-17: 1000 mg via INTRAVENOUS

## 2012-08-17 MED ORDER — MORPHINE SULFATE 4 MG/ML IJ SOLN
4.0000 mg | Freq: Once | INTRAMUSCULAR | Status: AC
  Administered 2012-08-17: 4 mg via INTRAVENOUS

## 2012-08-17 MED ORDER — ACETAMINOPHEN 10 MG/ML IV SOLN
INTRAVENOUS | Status: AC
  Filled 2012-08-17: qty 100

## 2012-08-17 MED ORDER — NEOSTIGMINE METHYLSULFATE 1 MG/ML IJ SOLN
INTRAMUSCULAR | Status: DC | PRN
  Administered 2012-08-17: 2 mg via INTRAVENOUS

## 2012-08-17 MED ORDER — HYDRALAZINE HCL 20 MG/ML IJ SOLN
10.0000 mg | Freq: Once | INTRAMUSCULAR | Status: AC
  Administered 2012-08-17: 10 mg via INTRAVENOUS

## 2012-08-17 MED ORDER — OXYCODONE HCL 5 MG PO TABS
5.0000 mg | ORAL_TABLET | ORAL | Status: DC | PRN
  Administered 2012-08-17 – 2012-08-18 (×5): 10 mg via ORAL
  Administered 2012-08-18: 5 mg via ORAL
  Administered 2012-08-18 (×2): 10 mg via ORAL
  Administered 2012-08-19: 5 mg via ORAL
  Administered 2012-08-19 (×3): 10 mg via ORAL
  Administered 2012-08-19 – 2012-08-20 (×4): 5 mg via ORAL
  Administered 2012-08-21 (×2): 10 mg via ORAL
  Administered 2012-08-21: 5 mg via ORAL
  Administered 2012-08-21: 10 mg via ORAL
  Administered 2012-08-21: 5 mg via ORAL
  Administered 2012-08-21 – 2012-08-22 (×5): 10 mg via ORAL
  Filled 2012-08-17: qty 1
  Filled 2012-08-17: qty 2
  Filled 2012-08-17: qty 1
  Filled 2012-08-17 (×3): qty 2
  Filled 2012-08-17 (×2): qty 1
  Filled 2012-08-17: qty 2
  Filled 2012-08-17: qty 1
  Filled 2012-08-17 (×3): qty 2
  Filled 2012-08-17: qty 1
  Filled 2012-08-17 (×3): qty 2
  Filled 2012-08-17: qty 1
  Filled 2012-08-17: qty 2
  Filled 2012-08-17: qty 1
  Filled 2012-08-17 (×7): qty 2

## 2012-08-17 MED ORDER — BISACODYL 5 MG PO TBEC
5.0000 mg | DELAYED_RELEASE_TABLET | Freq: Every day | ORAL | Status: DC | PRN
  Filled 2012-08-17: qty 1

## 2012-08-17 MED ORDER — ENOXAPARIN SODIUM 30 MG/0.3ML ~~LOC~~ SOLN
30.0000 mg | Freq: Two times a day (BID) | SUBCUTANEOUS | Status: DC
  Filled 2012-08-17 (×2): qty 0.3

## 2012-08-17 MED ORDER — CEFAZOLIN SODIUM-DEXTROSE 2-3 GM-% IV SOLR
2.0000 g | Freq: Four times a day (QID) | INTRAVENOUS | Status: AC
  Administered 2012-08-17 (×3): 2 g via INTRAVENOUS
  Filled 2012-08-17 (×3): qty 50

## 2012-08-17 MED ORDER — LOSARTAN POTASSIUM 50 MG PO TABS
100.0000 mg | ORAL_TABLET | Freq: Every day | ORAL | Status: DC
  Administered 2012-08-17 – 2012-08-18 (×2): 100 mg via ORAL
  Filled 2012-08-17 (×3): qty 2

## 2012-08-17 MED ORDER — LIDOCAINE HCL (CARDIAC) 20 MG/ML IV SOLN
INTRAVENOUS | Status: DC | PRN
  Administered 2012-08-17: 50 mg via INTRAVENOUS

## 2012-08-17 MED ORDER — INFLUENZA VIRUS VACC SPLIT PF IM SUSP
0.5000 mL | INTRAMUSCULAR | Status: AC
  Administered 2012-08-18: 0.5 mL via INTRAMUSCULAR
  Filled 2012-08-17: qty 0.5

## 2012-08-17 MED ORDER — POTASSIUM CHLORIDE IN NACL 20-0.9 MEQ/L-% IV SOLN
INTRAVENOUS | Status: DC
  Administered 2012-08-17: 100 mL/h via INTRAVENOUS
  Filled 2012-08-17 (×2): qty 1000

## 2012-08-17 MED ORDER — METOCLOPRAMIDE HCL 5 MG/ML IJ SOLN: 5.0000 mg | Freq: Three times a day (TID) | INTRAMUSCULAR | Status: DC | PRN

## 2012-08-17 MED ORDER — OXYCODONE HCL 40 MG PO TB12: 40.0000 mg | ORAL_TABLET | Freq: Two times a day (BID) | ORAL | Status: DC

## 2012-08-17 MED ORDER — DEXTROSE 5 % IV SOLN
3.0000 g | INTRAVENOUS | Status: AC
  Administered 2012-08-17: 3 g via INTRAVENOUS
  Filled 2012-08-17: qty 3000

## 2012-08-17 MED ORDER — HYDROMORPHONE HCL PF 1 MG/ML IJ SOLN
0.2500 mg | INTRAMUSCULAR | Status: DC | PRN
  Administered 2012-08-17 (×2): 0.5 mg via INTRAVENOUS

## 2012-08-17 MED ORDER — FLEET ENEMA 7-19 GM/118ML RE ENEM
1.0000 | ENEMA | Freq: Once | RECTAL | Status: AC | PRN
  Filled 2012-08-17: qty 1

## 2012-08-17 MED ORDER — ONDANSETRON HCL 4 MG PO TABS
4.0000 mg | ORAL_TABLET | Freq: Four times a day (QID) | ORAL | Status: DC | PRN
  Filled 2012-08-17: qty 1

## 2012-08-17 MED ORDER — OXYCODONE HCL 5 MG/5ML PO SOLN
5.0000 mg | Freq: Once | ORAL | Status: DC | PRN
  Filled 2012-08-17: qty 5

## 2012-08-17 MED ORDER — METHOCARBAMOL 500 MG PO TABS
500.0000 mg | ORAL_TABLET | Freq: Four times a day (QID) | ORAL | Status: DC | PRN
  Administered 2012-08-17 – 2012-08-22 (×12): 500 mg via ORAL
  Filled 2012-08-17 (×12): qty 1

## 2012-08-17 MED ORDER — LABETALOL HCL 5 MG/ML IV SOLN
INTRAVENOUS | Status: AC
  Administered 2012-08-17: 08:00:00
  Filled 2012-08-17: qty 4

## 2012-08-17 MED ORDER — ROCURONIUM BROMIDE 100 MG/10ML IV SOLN
INTRAVENOUS | Status: DC | PRN
  Administered 2012-08-17: 30 mg via INTRAVENOUS

## 2012-08-17 MED ORDER — DIPHENHYDRAMINE HCL 12.5 MG/5ML PO ELIX
12.5000 mg | ORAL_SOLUTION | ORAL | Status: DC | PRN
  Filled 2012-08-17: qty 10

## 2012-08-17 MED ORDER — LOSARTAN POTASSIUM-HCTZ 100-25 MG PO TABS: 1.0000 | ORAL_TABLET | Freq: Every day | ORAL | Status: DC

## 2012-08-17 MED ORDER — LABETALOL HCL 5 MG/ML IV SOLN
5.0000 mg | INTRAVENOUS | Status: DC | PRN
  Administered 2012-08-17: 5 mg via INTRAVENOUS

## 2012-08-17 MED ORDER — PROMETHAZINE HCL 25 MG/ML IJ SOLN: 6.2500 mg | INTRAMUSCULAR | Status: DC | PRN

## 2012-08-17 MED ORDER — MEPERIDINE HCL 50 MG/ML IJ SOLN: 6.2500 mg | INTRAMUSCULAR | Status: DC | PRN

## 2012-08-17 MED ORDER — HYDRALAZINE HCL 20 MG/ML IJ SOLN
INTRAMUSCULAR | Status: AC
  Filled 2012-08-17: qty 1

## 2012-08-17 SURGICAL SUPPLY — 40 items
BAG SPEC THK2 15X12 ZIP CLS (MISCELLANEOUS) ×1
BAG ZIPLOCK 12X15 (MISCELLANEOUS) ×2 IMPLANT
BANDAGE ELASTIC 4 VELCRO ST LF (GAUZE/BANDAGES/DRESSINGS) ×2 IMPLANT
BANDAGE ELASTIC 6 VELCRO ST LF (GAUZE/BANDAGES/DRESSINGS) ×2 IMPLANT
BIT DRILL 3.8X6 NS (BIT) ×1 IMPLANT
BIT DRILL 4.4 NS (BIT) ×2 IMPLANT
CLOTH BEACON ORANGE TIMEOUT ST (SAFETY) ×2 IMPLANT
DRAPE STERI IOBAN 125X83 (DRAPES) ×2 IMPLANT
DRSG MEPILEX BORDER 4X4 (GAUZE/BANDAGES/DRESSINGS) ×2 IMPLANT
DRSG MEPILEX BORDER 4X8 (GAUZE/BANDAGES/DRESSINGS) ×1 IMPLANT
DRSG PAD ABDOMINAL 8X10 ST (GAUZE/BANDAGES/DRESSINGS) ×2 IMPLANT
DURAPREP 26ML APPLICATOR (WOUND CARE) ×2 IMPLANT
ELECT REM PT RETURN 9FT ADLT (ELECTROSURGICAL) ×2
ELECTRODE REM PT RTRN 9FT ADLT (ELECTROSURGICAL) ×1 IMPLANT
GLOVE ECLIPSE 8.5 STRL (GLOVE) ×4 IMPLANT
GOWN STRL REIN XL XLG (GOWN DISPOSABLE) ×2 IMPLANT
GUIDEWIRE BALL NOSE 80CM (WIRE) ×1 IMPLANT
KIT BASIN OR (CUSTOM PROCEDURE TRAY) ×2 IMPLANT
MANIFOLD NEPTUNE II (INSTRUMENTS) ×2 IMPLANT
NAIL TIBIA 13X34.5 (Nail) ×1 IMPLANT
PACK GENERAL/GYN (CUSTOM PROCEDURE TRAY) ×2 IMPLANT
PADDING CAST ABS 4INX4YD NS (CAST SUPPLIES) ×1
PADDING CAST ABS COTTON 4X4 ST (CAST SUPPLIES) IMPLANT
PIN GUIDE ACE (PIN) ×2 IMPLANT
POSITIONER SURGICAL ARM (MISCELLANEOUS) ×2 IMPLANT
SCREW ACE CAP BN (Screw) ×2 IMPLANT
SCREW ACECAP 40MM (Screw) ×2 IMPLANT
SCREW ACECAP 50MM (Screw) ×2 IMPLANT
SCREW BN OBLQ FT 54X4.5XST 2 (Screw) IMPLANT
SCREW PROXIMAL DEPUY (Screw) ×4 IMPLANT
SCREW PRXML FT 40X5.5XNS LF (Screw) ×1 IMPLANT
SCREW PRXML FT 55X5.5XNS TIB (Screw) ×1 IMPLANT
SCREWDRIVER HEX TIP 3.5MM (MISCELLANEOUS) ×1 IMPLANT
SPONGE GAUZE 4X4 12PLY (GAUZE/BANDAGES/DRESSINGS) ×2 IMPLANT
STAPLER VISISTAT (STAPLE) ×2 IMPLANT
SUT VIC AB 0 CT1 27 (SUTURE) ×4
SUT VIC AB 0 CT1 27XBRD ANTBC (SUTURE) ×2 IMPLANT
SUT VIC AB 2-0 CT1 27 (SUTURE) ×2
SUT VIC AB 2-0 CT1 TAPERPNT 27 (SUTURE) ×1 IMPLANT
TOWEL OR 17X26 10 PK STRL BLUE (TOWEL DISPOSABLE) ×4 IMPLANT

## 2012-08-17 NOTE — Anesthesia Postprocedure Evaluation (Signed)
Anesthesia Post Note  Patient: Norva Karvonen  Procedure(s) Performed: Procedure(s) (LRB): INTRAMEDULLARY (IM) NAIL TIBIAL (Right)  Anesthesia type: General  Patient location: PACU  Post pain: Pain level controlled  Post assessment: Post-op Vital signs reviewed  Last Vitals: BP 200/87  Pulse 96  Temp 36.6 C (Oral)  Resp 17  SpO2 93%  Post vital signs: Reviewed  Level of consciousness: sedated  PACU Course: Pt with SpO2 in low 80's. Pulmonary edema and possible fluid overload on preop CXR. SpO2 up to 97% with 4L Camp in PACU. Pt BP high, but consistent with preop. Due to possibly worsening of fluid status and possibly CHF. Discussed with Dr. Otelia Sergeant that pt would benefit from hospitalist consult and stepdown admission.   Complications: No apparent anesthesia complications

## 2012-08-17 NOTE — Consult Note (Signed)
Reason for Consult:Urinary Retention Referring Physician: Vira Browns MD  Terry [name] is an 66 y.o. male.  HPI:   1 - Urinary Retention, Urethral Stricture, Phimosis - Pt with baseline urinary urge / freq with occasional leakage in urinary retention following ortho procedure for Rt tib-fib spiral fracture yesterday 08/16/2012. Now in CHF with need for close UOP monitoring, just leaking small quantities of urine.   No prior GU surgery or instrumentation. Denies recurrent STD or known stricture. He is found on exam today to have moderate phimosis and on cysto to have high-grade distal penile urethral stricture.  Past Medical History  Diagnosis Date  . Diabetes mellitus without complication   . Hypertension     Past Surgical History  Procedure Date  . Back surgery     numerous spine surgeries, 1966-2011  . Joint replacement     bilateral, each replaced twice    No family history on file.  Social History:  reports that he has been smoking Cigarettes.  He has been smoking about 1 pack per day. He has never used smokeless tobacco. He reports that he drinks alcohol. He reports that he does not use illicit drugs.  Allergies: No Known Allergies  Medications: I have reviewed the patient's current medications.  Results for orders placed during the hospital encounter of 08/16/12 (from the past 48 hour(s))  GLUCOSE, CAPILLARY     Status: Normal   Collection Time   08/16/12  8:27 PM      Component Value Range Comment   Glucose-Capillary 92  70 - 99 mg/dL   CBC WITH DIFFERENTIAL     Status: Abnormal   Collection Time   08/16/12  8:29 PM      Component Value Range Comment   WBC 8.2  4.0 - 10.5 K/uL    RBC 4.29  4.22 - 5.81 MIL/uL    Hemoglobin 10.7 (*) 13.0 - 17.0 g/dL    HCT 45.4 (*) 09.8 - 52.0 %    MCV 82.5  78.0 - 100.0 fL    MCH 24.9 (*) 26.0 - 34.0 pg    MCHC 30.2  30.0 - 36.0 g/dL    RDW 11.9 (*) 14.7 - 15.5 %    Platelets 217  150 - 400 K/uL    Neutrophils Relative 61  43  - 77 %    Lymphocytes Relative 24  12 - 46 %    Monocytes Relative 13 (*) 3 - 12 %    Eosinophils Relative 1  0 - 5 %    Basophils Relative 1  0 - 1 %    Neutro Abs 4.9  1.7 - 7.7 K/uL    Lymphs Abs 2.0  0.7 - 4.0 K/uL    Monocytes Absolute 1.1 (*) 0.1 - 1.0 K/uL    Eosinophils Absolute 0.1  0.0 - 0.7 K/uL    Basophils Absolute 0.1  0.0 - 0.1 K/uL    RBC Morphology POLYCHROMASIA PRESENT   OVAL MACROCYTES  COMPREHENSIVE METABOLIC PANEL     Status: Abnormal   Collection Time   08/16/12  8:29 PM      Component Value Range Comment   Sodium 139  135 - 145 mEq/L    Potassium 3.6  3.5 - 5.1 mEq/L    Chloride 101  96 - 112 mEq/L    CO2 26  19 - 32 mEq/L    Glucose, Bld 81  70 - 99 mg/dL    BUN 50 (*) 6 - 23 mg/dL  Creatinine, Ser 1.48 (*) 0.50 - 1.35 mg/dL    Calcium 8.8  8.4 - 59.5 mg/dL    Total Protein 8.0  6.0 - 8.3 g/dL    Albumin 3.2 (*) 3.5 - 5.2 g/dL    AST 57 (*) 0 - 37 U/L    ALT 54 (*) 0 - 53 U/L    Alkaline Phosphatase 118 (*) 39 - 117 U/L    Total Bilirubin 0.6  0.3 - 1.2 mg/dL    GFR calc non Af Amer 47 (*) >90 mL/min    GFR calc Af Amer 55 (*) >90 mL/min   PROTIME-INR     Status: Abnormal   Collection Time   08/16/12  8:29 PM      Component Value Range Comment   Prothrombin Time 17.8 (*) 11.6 - 15.2 seconds    INR 1.51 (*) 0.00 - 1.49   HEMOGLOBIN A1C     Status: Abnormal   Collection Time   08/16/12  8:29 PM      Component Value Range Comment   Hemoglobin A1C 7.6 (*) <5.7 %    Mean Plasma Glucose 171 (*) <117 mg/dL   URINALYSIS, ROUTINE W REFLEX MICROSCOPIC     Status: Abnormal   Collection Time   08/16/12  9:48 PM      Component Value Range Comment   Color, Urine YELLOW  YELLOW    APPearance CLOUDY (*) CLEAR    Specific Gravity, Urine 1.017  1.005 - 1.030    pH 5.0  5.0 - 8.0    Glucose, UA NEGATIVE  NEGATIVE mg/dL    Hgb urine dipstick TRACE (*) NEGATIVE    Bilirubin Urine NEGATIVE  NEGATIVE    Ketones, ur NEGATIVE  NEGATIVE mg/dL    Protein, ur 638  (*) NEGATIVE mg/dL    Urobilinogen, UA 0.2  0.0 - 1.0 mg/dL    Nitrite NEGATIVE  NEGATIVE    Leukocytes, UA SMALL (*) NEGATIVE   URINE MICROSCOPIC-ADD ON     Status: Abnormal   Collection Time   08/16/12  9:48 PM      Component Value Range Comment   Squamous Epithelial / LPF RARE  RARE    WBC, UA 7-10  <3 WBC/hpf    RBC / HPF 0-2  <3 RBC/hpf    Bacteria, UA MANY (*) RARE   GLUCOSE, CAPILLARY     Status: Abnormal   Collection Time   08/17/12  1:00 AM      Component Value Range Comment   Glucose-Capillary 107 (*) 70 - 99 mg/dL   GLUCOSE, CAPILLARY     Status: Abnormal   Collection Time   08/17/12  3:02 AM      Component Value Range Comment   Glucose-Capillary 105 (*) 70 - 99 mg/dL   GLUCOSE, CAPILLARY     Status: Abnormal   Collection Time   08/17/12  8:03 AM      Component Value Range Comment   Glucose-Capillary 106 (*) 70 - 99 mg/dL   PRO B NATRIURETIC PEPTIDE     Status: Abnormal   Collection Time   08/17/12  9:00 AM      Component Value Range Comment   Pro B Natriuretic peptide (BNP) 3818.0 (*) 0 - 125 pg/mL   TROPONIN I     Status: Abnormal   Collection Time   08/17/12  9:00 AM      Component Value Range Comment   Troponin I 0.55 (*) <0.30 ng/mL   COMPREHENSIVE METABOLIC  PANEL     Status: Abnormal   Collection Time   08/17/12  9:05 AM      Component Value Range Comment   Sodium 140  135 - 145 mEq/L    Potassium 3.7  3.5 - 5.1 mEq/L    Chloride 102  96 - 112 mEq/L    CO2 25  19 - 32 mEq/L    Glucose, Bld 112 (*) 70 - 99 mg/dL    BUN 41 (*) 6 - 23 mg/dL    Creatinine, Ser 1.61  0.50 - 1.35 mg/dL    Calcium 8.6  8.4 - 09.6 mg/dL    Total Protein 7.5  6.0 - 8.3 g/dL    Albumin 2.9 (*) 3.5 - 5.2 g/dL    AST 49 (*) 0 - 37 U/L    ALT 45  0 - 53 U/L    Alkaline Phosphatase 103  39 - 117 U/L    Total Bilirubin 0.5  0.3 - 1.2 mg/dL    GFR calc non Af Amer 58 (*) >90 mL/min    GFR calc Af Amer 67 (*) >90 mL/min   ETHANOL     Status: Normal   Collection Time   08/17/12   9:05 AM      Component Value Range Comment   Alcohol, Ethyl (B) <11  0 - 11 mg/dL   BLOOD GAS, ARTERIAL     Status: Abnormal   Collection Time   08/17/12  9:28 AM      Component Value Range Comment   O2 Content 10.0      Delivery systems SIMPLE MASK      pH, Arterial 7.312 (*) 7.350 - 7.450    pCO2 arterial 48.0 (*) 35.0 - 45.0 mmHg    pO2, Arterial 77.9 (*) 80.0 - 100.0 mmHg    Bicarbonate 23.6  20.0 - 24.0 mEq/L    TCO2 22.2  0 - 100 mmol/L    Acid-base deficit 2.3 (*) 0.0 - 2.0 mmol/L    O2 Saturation 94.1      Patient temperature 98.4      Collection site RIGHT RADIAL      Drawn by [id number]      Sample type ARTERIAL      Allens test (pass/fail) PASS  PASS     Dg Chest 1 View  08/17/2012  *RADIOLOGY REPORT*  Clinical Data: Shortness of breath postoperatively.  PORTABLE CHEST - 1 VIEW 08/17/2012 0656 hours:  Comparison: Portable chest x-ray yesterday.  Findings: Cardiac silhouette moderately enlarged but stable. Pulmonary venous hypertension and moderate interstitial pulmonary edema, slightly increased since the examination yesterday.  This is superimposed upon baseline chronic lung disease.  No confluent airspace consolidation.  Probable small right pleural effusion.  IMPRESSION: Slight worsening of interstitial pulmonary edema since yesterday, consistent with CHF and/or fluid overload.   Original Report Authenticated By: Hulan Saas, M.D.    Dg Tibia/fibula Right  08/17/2012  *RADIOLOGY REPORT*  Clinical Data: ORIF tib-fib fracture  RIGHT TIBIA AND FIBULA - 2 VIEW  Comparison: Preoperative radiographs 08/16/2012  Findings: Multiple intraoperative spot radiographs obtained during ORIF of distal tibia fracture with intramedullary rod with to proximal and two distal locking screws.  Alignment of the fracture fragments appears improved compared to the preoperative radiographs.  No evidence of immediate hardware complication.  IMPRESSION: ORIF of distal tibia fracture without evidence of  immediate complication as above.   Original Report Authenticated By: Malachy Moan, M.D.    Dg Tibia/fibula Right  08/16/2012  *  RADIOLOGY REPORT*  Clinical Data: Larey Seat out of a wheelchair and injured the right lower leg and ankle.  RIGHT TIBIA AND FIBULA - 2 VIEW  Comparison: Right ankle x-rays obtained concurrently.  Findings: Oblique comminuted fracture involving the distal tibial metaphysis.  Nondisplaced fractures extending more inferiorly in the metaphysis, though these do not extend to the articular surface.  No fractures elsewhere involving the tibia.  Nondisplaced fracture involving the proximal fibular metaphysis along its medial cortical surface.  Chondrocalcinosis in the medial meniscus of the knee.  Mild medial compartment joint space narrowing.  IMPRESSION:  1.  Oblique, comminuted fracture involving the distal tibial metaphysis with nondisplaced fractures more distally in the metaphysis that do not extend to the articular surface. 2.  Nondisplaced fracture involving the medial cortex of the proximal fibular metaphysis. 3.  CPPD arthropathy involving the knee.   Original Report Authenticated By: Hulan Saas, M.D.    Dg Ankle Complete Right  08/16/2012  *RADIOLOGY REPORT*  Clinical Data: Larey Seat out of a wheelchair and injured the right lower leg and ankle.  RIGHT ANKLE - COMPLETE 3+ VIEW  Comparison: Right tibia-fibula x-rays obtained concurrently.  Findings: Comminuted oblique fracture involving the distal tibial metaphysis, with a vertically oriented linear fracture extending more distally as well as a nondisplaced oblique fracture in the opposite direction more distally.  The fracture line does not appear to involve the articular surface.  No fractures about the ankle joint proper.  Ankle mortise intact.  Calcification in the soft tissues posterior to the ankle joint which are likely vascular.  IMPRESSION: Oblique, comminuted fracture involving the distal tibial metaphysis with  nondisplaced fractures more distally in the metaphysis that do not extend to the articular surface. No other fractures.   Original Report Authenticated By: Hulan Saas, M.D.    Dg Chest Port 1 View  08/16/2012  *RADIOLOGY REPORT*  Clinical Data: Preoperative respiratory exam for distal tibial fracture on the right.  PORTABLE CHEST - 1 VIEW  Comparison: 03/27/2010  Findings: Moderate interstitial pulmonary edema present superimposed on chronic lung disease.  Stable cardiomegaly.  No large pleural effusions.  IMPRESSION: Moderate interstitial edema and cardiomegaly.   Original Report Authenticated By: Irish Lack, M.D.    Dg C-arm 1-60 Min-no Report  08/17/2012  CLINICAL DATA: tib fib fracture   C-ARM 1-60 MINUTES  Fluoroscopy was utilized by the requesting physician.  No radiographic  interpretation.      Review of Systems  Constitutional: Negative.  Negative for fever and chills.  HENT: Negative.   Eyes: Negative.   Respiratory: Negative.   Cardiovascular: Negative.   Gastrointestinal: Negative for nausea.  Genitourinary: Positive for urgency and frequency.       Occasional urge leakage at baseline  Musculoskeletal: Positive for joint pain.  Skin: Negative.   Neurological: Negative.        Bilat LE neuropathy at baseline  Endo/Heme/Allergies: Negative.   Psychiatric/Behavioral: Negative.    Blood pressure 201/86, pulse 96, temperature 97.6 F (36.4 C), temperature source Oral, resp. rate 20, SpO2 100.00%. Physical Exam  Constitutional: He is oriented to person, place, and time. He appears well-developed and well-nourished.       In ICU, not intubated, family at bedside.  HENT:  Head: Normocephalic and atraumatic.  Eyes: EOM are normal. Pupils are equal, round, and reactive to light.  Neck: Normal range of motion. Neck supple.  Cardiovascular: Normal rate.   Respiratory: Effort normal and breath sounds normal.  GI: Soft. Bowel sounds are normal.  Genitourinary: Penis  normal.       Moderate phimosis. Actively leaking small quantities of urine in spurts.  Musculoskeletal: Normal range of motion.       RLE in ortho appliance  Neurological: He is alert and oriented to person, place, and time.  Skin: Skin is warm and dry.  Psychiatric: He has a normal mood and affect. His behavior is normal. Judgment and thought content normal.    Assessment/Plan: 1 - Urinary Retention, Urethral Stricture, Phimosis - Unable to pass catheter at bedside with single attempt of 66F due to resistance in distal urethra. Bedside cysto with urethral stricture which was dilated as per separate procedure note and new 66F council catheter placed with immediate efflux of 800cc cloudy urine. This stricture likely at least partially explains his baseline voiding symptoms. Phimosis moderate and unable to completely retract foreskin, but can see urethral meatus.  Foley to stay x 5 days minimum, then trial of void.     Denton Derks 08/17/2012, 10:33 AM

## 2012-08-17 NOTE — Progress Notes (Signed)
  Echocardiogram 2D Echocardiogram has been performed.  Terry [name] 08/17/2012, 11:44 AM

## 2012-08-17 NOTE — Brief Op Note (Signed)
08/16/2012 - 08/17/2012  10:41 AM  PATIENT:  Terry [name]  66 y.o. male  PRE-OPERATIVE DIAGNOSIS:  Urethral Sctricture  POST-OPERATIVE DIAGNOSIS:  Urethral Sctricture  PROCEDURE:  Cysto, Balloon urethral dilation, placement of catheter (complicated)  SURGEON:  Surgeon(s) and Role:    TED B Swade Shonka MD  PHYSICIAN ASSISTANT:   ASSISTANTS: none   ANESTHESIA:   IV sedation  EBL:  Total I/O In: 700 [I.V.:600; IV Piggyback:100] Out: -   BLOOD ADMINISTERED:none  DRAINS: 54F Foley to straight drain.   LOCAL MEDICATIONS USED:  NONE  SPECIMEN:  No Specimen  DISPOSITION OF SPECIMEN:  N/A  COUNTS:  NO   TOURNIQUET:  None  DICTATION: .Other Dictation: Dictation Number [id number]  PLAN OF CARE: Admit to inpatient   PATIENT DISPOSITION:  ICU - extubated and stable.   Delay start of Pharmacological VTE agent (>24hrs) due to surgical blood loss or risk of bleeding: no

## 2012-08-17 NOTE — Consult Note (Addendum)
[facility] Medical Consultation  Terry [name] BJY:[id number] DOB: 1946-04-05 DOA: 08/16/2012 PCP: No primary provider on file.   Requesting physician: Dr. Otelia Sergeant (Orthopedics) Date of consultation: 08/17/2012 Reason for consultation: confusion and low oxygen saturation  HPI: 66 year old male with history of HTN and DM who presented 08/16/2012 status post fall in his office, has sustained right tibial distal fracture, oblique spiral tibia and proximal nondisplaced fibula neck fracture. Patient is now status post ORIF of distal tibia fracture (POD#1). Orthopedics requested internal medicine consult as patient ahd low O2 saturation in PACU and confusion. In addition, CXR done and showed likely worsening interstitial edema and CHF.  Impression/Recommendations:  Principal Problem:  *Fracture of tibia, distal, right, closed  Status post ORIF of distal tibia fracture (POD#1)  Management per ortho team  Active Problems: Hypoxic respiratory failure  Unclear why patient has had hypoxia, INR is 1.51 but would consider possible PE versus interstitial edema, CHF  Due to acute kidney injury we can't do CT angio chest but we can proceed with V/Q scan  Obtain blood gas  O2 support now with 10 L simple mask and saturating above 90%  Will give 1 dose of lasix which may even improve kiidney function  IV fluids only KVO for now  Get 1 set cardiac enzymes, 12 lead EKG, 2 D ECHO and BNP  Will consider cardio consult once above results are available Confusion  Perhaps due to fat embolism or postoperative  No petechia noted over body  Will continue to monitor mental status changes  Diabetes mellitus type 2 with peripheral artery disease  A1c this admission 7.6, above the goal for good glucose control  Continue sliding scale insulin  Hypertension   Continue losartan and metoprolol  Add hydralazine 25 mg Q 8 hours PO  Anemia  Likely of chronic disease  No signs of active  bleed  Continue to monitor CBC  Acute kidney injury  Perhaps prerenal, postoperative  Will give 1 dose of lasix for CHF which may improve kidney finction  Coagulopathy  Unclear etiology  No signs of active bleed  Follow up INR in am  I will followup again tomorrow. Please contact me if I can be of assistance in the meanwhile. Thank you for this consultation.  Manson Passey [facility] Pager [phone]  If 7PM-7AM, please contact night-coverage [email or web address] Password [facility] 08/17/2012, 8:35 AM    Review of Systems:  Constitutional: Negative for fever, chills, diaphoresis, activity change, appetite change and fatigue.  HENT: Negative for ear pain, nosebleeds, congestion, facial swelling, rhinorrhea, neck pain, neck stiffness and ear discharge.   Eyes: Negative for pain, discharge, redness, itching and visual disturbance.  Respiratory: Negative for cough, choking, chest tightness, shortness of breath, wheezing and stridor.   Cardiovascular: Negative for chest pain, palpitations and leg swelling.  Gastrointestinal: Negative for abdominal distention.  Genitourinary: Negative for dysuria, urgency, frequency, hematuria, flank pain, decreased urine volume, difficulty urinating and dyspareunia.  Musculoskeletal: positive for fall and fracture (RLE).  Neurological: Negative for dizziness, tremors, seizures, syncope, facial asymmetry, speech difficulty, weakness, light-headedness, numbness and headaches.  Hematological: Negative for adenopathy. Does not bruise/bleed easily.  Psychiatric/Behavioral: Negative for hallucinations, behavioral problems, confusion, dysphoric mood, decreased concentration and agitation.    Past Medical History  Diagnosis Date  . Diabetes mellitus without complication   . Hypertension    Past Surgical History  Procedure Date  . Back surgery     numerous spine surgeries, 1966-2011  . Joint replacement  bilateral, each replaced twice   Social  History:  reports that he has been smoking Cigarettes.  He has been smoking about 1 pack per day. He has never used smokeless tobacco. He reports that he drinks alcohol. He reports that he does not use illicit drugs.  No Known Allergies  Family history: htn in mother  Prior to Admission medications   Medication Sig Start Date End Date Taking? Authorizing Provider  losartan-hydrochlorothiazide (HYZAAR) 100-25 MG per tablet Take 1 tablet by mouth daily.   Yes Historical Provider, MD  METFORMIN HCL PO Take 1 tablet by mouth 3 (three) times daily.   Yes Historical Provider, MD  metoprolol (LOPRESSOR) 50 MG tablet Take 50 mg by mouth 2 (two) times daily.   Yes Historical Provider, MD  oxycodone (OXY-IR) 5 MG capsule Take 20 mg by mouth 3 (three) times daily as needed. For pain   Yes Historical Provider, MD  oxyCODONE (OXYCONTIN) 40 MG 12 hr tablet Take 40 mg by mouth every 12 (twelve) hours.   Yes Historical Provider, MD   Physical Exam: Blood pressure 167/87, pulse 87, temperature 98.4 F (36.9 C), temperature source Oral, resp. rate 27, SpO2 100.00%. Filed Vitals:   08/17/12 0730 08/17/12 0745 08/17/12 0800 08/17/12 0826  BP: 194/81 216/91 159/67 167/87  Pulse: 96 98 87   Temp: 98.3 F (36.8 C)   98.4 F (36.9 C)  TempSrc:      Resp: 14 14 14 27   SpO2: 96% 95% 96% 100%     General:  No acute distress, oriented to name and time only  Eyes: PERRLA,  EOMI  ENT: no tonsillar erythema or exudates  Neck: supple, no lymphadenopathy, no JVD  Cardiovascular: S1, S2, no murmur appreciated, RRR  Respiratory: clear to auscultation bilaterally, no wheezing  Abdomen: (+) BS, no tenderness and no edema  Skin: no rash, no erythema; skin warm and dry  Musculoskeletal: s/p ortho procedure on right LE; left LE normal range of motion  Psychiatric: normal mood and affect  Neurologic: alert, awake, no focal neurologic deficits  Labs on Admission:  Basic Metabolic Panel:  Lab 08/16/12  2029  NA 139  K 3.6  CL 101  CO2 26  GLUCOSE 81  BUN 50*  CREATININE 1.48*  CALCIUM 8.8   Liver Function Tests:  Lab 08/16/12 2029  AST 57*  ALT 54*  ALKPHOS 118*  BILITOT 0.6  PROT 8.0  ALBUMIN 3.2*   CBC:  Lab 08/16/12 2029  WBC 8.2  HGB 10.7*  HCT 35.4*  MCV 82.5  PLT 217   CBG:  Lab 08/17/12 0302 08/17/12 0100 08/16/12 2027  GLUCAP 105* 107* 92    Radiological Exams on Admission: Dg Chest 1 View 08/17/2012 IMPRESSION: Slight worsening of interstitial pulmonary edema since yesterday, consistent with CHF and/or fluid overload.     Dg Tibia/fibula Right 08/17/2012    IMPRESSION: ORIF of distal tibia fracture without evidence of immediate complication as above.     Dg Tibia/fibula Right 08/16/2012  * IMPRESSION:  1.  Oblique, comminuted fracture involving the distal tibial metaphysis with nondisplaced fractures more distally in the metaphysis that do not extend to the articular surface. 2.  Nondisplaced fracture involving the medial cortex of the proximal fibular metaphysis. 3.  CPPD arthropathy involving the knee.     Dg Ankle Complete Right 08/16/2012   IMPRESSION: Oblique, comminuted fracture involving the distal tibial metaphysis with nondisplaced fractures more distally in the metaphysis that do not extend to the articular  surface. No other fractures.     Dg Chest Port 1 View 08/16/2012  IMPRESSION: Moderate interstitial edema and cardiomegaly.     Time spent: 75 minutes

## 2012-08-17 NOTE — Progress Notes (Addendum)
Subjective: Day of Surgery Procedure(s) (LRB): INTRAMEDULLARY (IM) NAIL TIBIAL (Right) Intermittantly awakens, pulling at sheets and bed. Received call [phone] that Mr. Winger Is having low sats in the PACU. Recovery room anesthesia and RN are requesting a Stepdown bed. Apparently his o2 saturation in the mid 80's on 4 liter pnc. He is confused  Patient reports pain as moderate.    Objective:   VITALS:  Temp:  [97.5 F (36.4 C)-99 F (37.2 C)] 98.3 F (36.8 C) (12/01 0730) Pulse Rate:  [81-140] 87  (12/01 0800) Resp:  [9-17] 14  (12/01 0800) BP: (149-216)/(57-93) 159/67 mmHg (12/01 0800) SpO2:  [71 %-100 %] 96 % (12/01 0800)  Neurologically intact ABD soft Sensation intact distally Incision: dressing C/D/I   LABS  Basename 08/16/12 2029  HGB 10.7*  WBC 8.2  PLT 217    Basename 08/16/12 2029  NA 139  K 3.6  CL 101  CO2 26  BUN 50*  CREATININE 1.48*  GLUCOSE 81    Basename 08/16/12 2029  LABPT --  INR 1.51*     Assessment/Plan: Day of Surgery Procedure(s) (LRB): INTRAMEDULLARY (IM) NAIL TIBIAL (Right) Respiratory decompensation/failure, chronic COPD, CXR cardiomegally and interstitial edema R/O ARDS or fat embolism syndrome, likely decompensation of a chronic CHF.  D/C IV fluids, TKO IVFs CXR done, BNP and Troponin ordered Transfer to stepdown unit on FM with 100% sats [facility] contacted and Dr. Lenis Noon will see. Urology consulted due to inability to place catheter, need to strict I&O.  NITKA,JAMES E 08/17/2012, 8:15 AM

## 2012-08-17 NOTE — Plan of Care (Signed)
Problem: Phase I Progression Outcomes Goal: Voiding-avoid urinary catheter unless indicated Outcome: Not Applicable Date Met:  08/17/12 Urinary retention until urinary catheter placed by urology.

## 2012-08-17 NOTE — Care Management Note (Addendum)
    Page 1 of 1   08/22/2012     2:40:39 PM   CARE MANAGEMENT NOTE 08/22/2012  Patient:  Terry [name], Terry [name]   Account Number:  [id number]  Date Initiated:  08/17/2012  Documentation initiated by:  Terry [name]  Subjective/Objective Assessment:   dx fall with distal tibial fx, s/p orif 11/30, hypoxia,  admit- lives with spouse, used rolling walker and cane at home.     Action/Plan:   pt/ot eval   Anticipated DC Date:  08/22/2012   Anticipated DC Plan:  SKILLED NURSING FACILITY      DC Planning Services  CM consult      Choice offered to / List presented to:             Status of service:  Completed, signed off Medicare Important Message given?   (If response is "NO", the following Medicare IM given date fields will be blank) Date Medicare IM given:   Date Additional Medicare IM given:    Discharge Disposition:  SKILLED NURSING FACILITY  Per UR Regulation:  Reviewed for med. necessity/level of care/duration of stay  If discussed at Long Length of Stay Meetings, dates discussed:   08/21/2012    Comments:  08/22/12 Terry Oldenburg RN,BSN NCM [phone] CONTACTED [facility] Terry [name] ABOUT DME-3N1 IN RM,THEY WILL P/U.D/C SNF.  08/21/12 Terry Bin RN,BSN NCM [phone] WHEEZING,02.PT-SNF.D/C PLAN SNF. 08/17/12 12:09 Terry Cape RN, BSN [phone] patient lives with spouse, patient uses rolling walker and cane at home.  Wife states thay have used [facility] in the past and if they need hh would like to work with [facility], await pt recs.

## 2012-08-17 NOTE — Transfer of Care (Signed)
Immediate Anesthesia Transfer of Care Note  Patient: Terry [name]  Procedure(s) Performed: Procedure(s) (LRB) with comments: INTRAMEDULLARY (IM) NAIL TIBIAL (Right)  Patient Location: PACU  Anesthesia Type:General  Level of Consciousness: awake, alert , oriented and patient cooperative  Airway & Oxygen Therapy: Patient Spontanous Breathing and Patient connected to face mask oxygen  Post-op Assessment: Report given to PACU RN, Post -op Vital signs reviewed and stable and Patient moving all extremities X 4  Post vital signs: Reviewed and stable  Complications: No apparent anesthesia complications

## 2012-08-17 NOTE — Brief Op Note (Signed)
08/16/2012 - 08/17/2012  2:49 AM  PATIENT:  Norva Karvonen  66 y.o. male  PRE-OPERATIVE DIAGNOSIS:  right tibial distal fracture, oblique spiral tibia and proximal nondisplaced fibula neck fx.  POST-OPERATIVE DIAGNOSIS:  right tibial distal fracture, oblique spiral tibia and proximal nondisplaced fibula neck fx  PROCEDURE:  Procedure(s) (LRB) with comments: INTRAMEDULLARY (IM) NAIL TIBIAL (Right) Biomet Versa nail Tibia 34.5cm x 13mm with 2 proximal and 2 distal interlocking Screws.  SURGEON:  Surgeon(s) and Role:    Kerrin Champagne, MD - Primary  ANESTHESIA:   general, Dr. Renold Don.  EBL:  Total I/O In: -  Out: 400 [Urine:400]  BLOOD ADMINISTERED:none  DRAINS: none   LOCAL MEDICATIONS USED:  NONE  SPECIMEN:  No Specimen  DISPOSITION OF SPECIMEN:  N/A  COUNTS:  YES  TOURNIQUET:   Total Tourniquet Time Documented: Calf (Right) - 76 minutes @ 300 mm Hg  DICTATION: .Dragon Dictation  PLAN OF CARE: Admit to inpatient   PATIENT DISPOSITION:  PACU - hemodynamically stable.   Delay start of Pharmacological VTE agent (>24hrs) due to surgical blood loss or risk of bleeding: no

## 2012-08-17 NOTE — Progress Notes (Signed)
PT Cancellation Note  Patient Details Name: ALLARD TIMPERLEY MRN: [id number] DOB: [birth year]   Cancelled Treatment:    Reason Eval/Treat Not Completed: Medical issues which prohibited therapy (Per RN troponin levels trending up and pt on ventimask). Will re-attempt PT eval tomorrow.    Ralene Bathe Kistler 08/17/2012, 12:52 PM [phone]

## 2012-08-17 NOTE — Op Note (Signed)
08/16/2012 - 08/17/2012  5:30 PM  PATIENT:  Terry [name]  66 y.o. male  MRN: [id number]  OPERATIVE REPORT  PRE-OPERATIVE DIAGNOSIS:  right tibial distal fracture  POST-OPERATIVE DIAGNOSIS:  right tibial distal fracture  PROCEDURE:  Procedure(s): INTRAMEDULLARY (IM) NAIL TIBIAL  INTRAMEDULLARY (IM) NAIL TIBIAL (Right) Biomet Versa nail Tibia 34.5cm x 13mm with 2 proximal and 2 distal interlocking screws.  SURGEON:  Kerrin Champagne, MD       ANESTHESIA:  General, Dr. Renold Don  COMPLICATIONS:  None.     COMPONENTS: Right Biomet 34.5 cm x 13 mm Tibial IM Versa Nail with 2 oblique proximal and 2 transverse distal interlocking screws.  PROCEDURE: The patient was met in the holding area, and the appropriate right leg identified and marked with an "X" and my initials.  The patient was then transported to OR and was placed on the operative table in a supine position. The patient was then placed under  general anesthesia without difficulty. The patient received appropriate preoperative antibiotic prophylaxis 2 g Ancef. Nursing staff unable to inserted a Foley catheter under sterile conditions to 2 phimosis or for skin contracture.    Tourniquet was applied to the operative  thigh.  Leg was then prepped using sterile conditions and draped using sterile technique.  Time-out procedure was called and correct. The right leg  was elevated and Esmarch exsanguinated with a thigh   tourniquet elevated ot 300 mmHg. incision made along the anterior medial aspect of the right patella tendon and inferior medial aspect of the patella through the skin and subcutaneous layers using 10 blade scalpel to the peritenon. Peritenon incised and then a 2 mm width of medial patella ligament incised longitudinally then spread. Bleeding controlled using electrocautery. Incision made directly down through the pre-tibial fat pad to the proximal tibia. Small self-retaining retractor placed. Cannulated awl then inserted using  C-arm to place the awl in the midline just below the anterior joint surface of the proximal tibia. The all then carefully guided within the proximal portion of the tibial intramedullary canal observed on both AP and lateral to be seated within the proximal intramedullary canal just anterior to the posterior cortex. Long guidepin then carefully bent at its tip and then guided through the cannulated awl within the intramedullary canal and the fracture reduced and the pin passed through the fracture site to the distal tibial epiphysis scar. A length then measured at approximately 34-1/2 cm of expected nail. Soft tissue sleeve protector inserted and then reaming begun first using the proximal reamer of about 14 mm with. Then passing the long intramedullary reamers in 1 mm increments from 11, 12,13 and then 14 mm. Last millimeter carried out in half millimeter increments. Each time the reaming carried out to the distal epiphysis scar. Fracture also reduced while reaming the distal fragment. 13 mm nail then attached to its insertion handle with guide for the proximal interlock screws the knee flexed 90 in the nail passed over the guidepin and then impacted into place and seated with the tip of the nail at the distal epiphyseal scar. Fracture was impacted into place and reduced nicely in the AP and lateral planes. The proximal handle then rotated into the oblique proximal position stab incision made counter to the sleeve passed through the proximal portion of the handle for the oblique proximal screw. Stab incision a quarter inch in length in the skin spread through subcutaneous layers down to bone and the sleeve placed. A drill sleeve then passed  through the center of the sleeve and a 4.5 drill then used to drill proximal oblique passage measuring off the drill as the drill passed the cortex of the proximal tibia. Additional recheck with depth gauge indicated the correct length and through the sleeve after removing the  drill sleeve. This obtained excellent purchase. Insertion handle then rotated to the distal like position and again in the sequence previously described incision made sleeve passed down to bone then drill sleeve placed and drilling performed measured for depth and appropriate size screw placed fixing the proximal portion of the nail to the proximal tibia with 2 oblique interlocking screws. Insertion handle was then removed without difficulty. Irrigation performed of the proximal incision sites. Leg brought out into full extension and wash base and turned upside down used to hold the leg above the operating room table on C-arm fluoroscopy to be brought into the field sterilely and carefully positioned so that the distal interlocking screw holes were visualized to the fullest extent. Using the C-arm then a tonsil clamp used to mark the skin overlying the expected area for placement of the distal interlocking screws. An incision was made with 10 blade scalpel and subcutaneous layers then spread using hemostats down to bone. Freehand technique was then used to place a 4.5 drill bit through the distal interlocking screw holes prior to placement of the 2 5.5 interlocking screws distally in transverse position. Careful examination of the length of the screws showed the distal of the 2 distal screws to be long and therefore this was exchanged for a shorter length screw without difficulty. Irrigation was then carried out of all the incision sites following documentation of the entire tibia in both AP and lateral planes showing the nail and the fracture to be in good position alignment. The fracture well reduced with good cortical cortical contact. Following irrigation and then the 2 distal incisions were approximated with subcutaneous sutures of 2-0 Vicryl sutures. The 2 proximal stab incisions for interlocking screws were approximated with interrupted subcutaneous sutures of 2-0 Vicryl. Proximal incision for insertion of  the nail was carefully irrigated and the medial tendon insertion site reapproximated with interrupted 0 Vicryl sutures the peritenon approximated with interrupted 2 Vicryl sutures subcutaneous layers approximated with interrupted 2-0 Vicryl sutures and skin closed with stainless steel staples. Stainless steel staples were then used to approximate skin edges of the 2 proximal interlocking stab incision sites and the 2 distal medial stab incision sites. Dressings of Mepilex applied to the proximal incisions and along the medial distal incision sites. Well-padded posterior splint applied with the ankle at 90 using ABDs pads and sterile Webril and plaster of Paris, Kerlix an Ace wrap was applied from the right foot to the right upper thigh. Hypafix tape used to further fix the Mepilex bandage over the anterior aspect of the knee. Allis and sponge counts were correct the tourniquet was released following application of dressings total tourniquet time was 76 minutes at 300 mm mercury. Patient was then reactivated extubated returned to recovery room in satisfactory condition.       NITKA,JAMES E  08/17/2012, 5:30 PM

## 2012-08-17 NOTE — Progress Notes (Signed)
CRITICAL VALUE ALERT  Critical value received:  Troponin 0.55  Date of notification:  08-17-2012  Time of notification:  10:18 am  Critical value read back:  yes  Nurse who received alert:  Roney Jaffe, RN  MD notified (1st page):  Dr. Manson Passey  Time of first page:  10:30 am  MD notified (2nd page)  Time of second page:  Responding MD:  Dr. Manson Passey  Time MD responded:  10:31

## 2012-08-17 NOTE — Progress Notes (Signed)
Subjective: Day of Surgery Procedure(s) (LRB): INTRAMEDULLARY (IM) NAIL TIBIAL (Right) Awake alert oriented x4. Much better sense awareness this evening. Troponin levels were elevated patient has responded to pulmonary concerns diuresis. BiPAP improved overall lung function. Right leg with discomfort that is moderate. Patient reports pain as moderate.    Objective:   VITALS:  Temp:  [97.5 F (36.4 C)-98.6 F (37 C)] 98.3 F (36.8 C) (12/01 1600) Pulse Rate:  [81-140] 90  (12/01 1800) Resp:  [9-27] 18  (12/01 1800) BP: (124-216)/(41-106) 124/41 mmHg (12/01 1800) SpO2:  [86 %-100 %] 91 % (12/01 1600) FiO2 (%):  [35 %-50 %] 40 % (12/01 1500) Weight:  [89.812 kg (198 lb)] 89.812 kg (198 lb) (12/01 0900)  ABD soft Dorsiflexion/Plantar flexion intact Incision: dressing C/D/I   LABS  Basename 08/16/12 2029  HGB 10.7*  WBC 8.2  PLT 217    Basename 08/17/12 0905 08/16/12 2029  NA 140 139  K 3.7 3.6  CL 102 101  CO2 25 26  BUN 41* 50*  CREATININE 1.25 1.48*  GLUCOSE 112* 81    Basename 08/16/12 2029  LABPT --  INR 1.51*     Assessment/Plan: Day of Surgery Procedure(s) (LRB): INTRAMEDULLARY (IM) NAIL TIBIAL (Right) Respiratory decompensation likely related to chronic congestive heart failure. Troponin was found to be elevated this evening.  Advance diet Up with therapy Continue with step down unit evaluation of cardiac and pulmonary concerns for the prominence of congestive heart failure decompensation following right tibia IM nail for right distal tibia fracture.  Markiah Janeway E 08/17/2012, 6:41 PM

## 2012-08-17 NOTE — Preoperative (Signed)
Beta Blockers   Reason not to administer Beta Blockers:Not Applicable 

## 2012-08-17 NOTE — Addendum Note (Signed)
Addendum  created 08/17/12 0749 by Gaylan Gerold, MD   Modules edited:Orders

## 2012-08-18 ENCOUNTER — Other Ambulatory Visit (HOSPITAL_COMMUNITY): Payer: 59

## 2012-08-18 ENCOUNTER — Inpatient Hospital Stay (HOSPITAL_COMMUNITY): Payer: 59

## 2012-08-18 ENCOUNTER — Encounter (HOSPITAL_COMMUNITY): Payer: Self-pay | Admitting: Cardiology

## 2012-08-18 DIAGNOSIS — M519 Unspecified thoracic, thoracolumbar and lumbosacral intervertebral disc disorder: Secondary | ICD-10-CM | POA: Insufficient documentation

## 2012-08-18 DIAGNOSIS — E669 Obesity, unspecified: Secondary | ICD-10-CM | POA: Insufficient documentation

## 2012-08-18 DIAGNOSIS — R140 Abdominal distension (gaseous): Secondary | ICD-10-CM | POA: Insufficient documentation

## 2012-08-18 DIAGNOSIS — D689 Coagulation defect, unspecified: Secondary | ICD-10-CM

## 2012-08-18 DIAGNOSIS — IMO0002 Reserved for concepts with insufficient information to code with codable children: Secondary | ICD-10-CM | POA: Diagnosis present

## 2012-08-18 DIAGNOSIS — S82899A Other fracture of unspecified lower leg, initial encounter for closed fracture: Principal | ICD-10-CM

## 2012-08-18 DIAGNOSIS — I119 Hypertensive heart disease without heart failure: Secondary | ICD-10-CM | POA: Diagnosis present

## 2012-08-18 DIAGNOSIS — I739 Peripheral vascular disease, unspecified: Secondary | ICD-10-CM

## 2012-08-18 DIAGNOSIS — E1159 Type 2 diabetes mellitus with other circulatory complications: Secondary | ICD-10-CM

## 2012-08-18 DIAGNOSIS — N179 Acute kidney failure, unspecified: Secondary | ICD-10-CM

## 2012-08-18 LAB — COMPREHENSIVE METABOLIC PANEL
Albumin: 2.7 g/dL — ABNORMAL LOW (ref 3.5–5.2)
BUN: 36 mg/dL — ABNORMAL HIGH (ref 6–23)
Chloride: 100 mEq/L (ref 96–112)
Creatinine, Ser: 1.36 mg/dL — ABNORMAL HIGH (ref 0.50–1.35)
Total Bilirubin: 0.5 mg/dL (ref 0.3–1.2)
Total Protein: 7 g/dL (ref 6.0–8.3)

## 2012-08-18 LAB — GLUCOSE, CAPILLARY
Glucose-Capillary: 132 mg/dL — ABNORMAL HIGH (ref 70–99)
Glucose-Capillary: 152 mg/dL — ABNORMAL HIGH (ref 70–99)

## 2012-08-18 LAB — CBC
MCV: 83.1 fL (ref 78.0–100.0)
RBC: 4.13 MIL/uL — ABNORMAL LOW (ref 4.22–5.81)
RDW: 22 % — ABNORMAL HIGH (ref 11.5–15.5)
WBC: 10.4 10*3/uL (ref 4.0–10.5)

## 2012-08-18 LAB — URINE CULTURE

## 2012-08-18 LAB — MAGNESIUM: Magnesium: 2.1 mg/dL (ref 1.5–2.5)

## 2012-08-18 MED ORDER — HEPARIN BOLUS VIA INFUSION
2500.0000 [IU] | Freq: Once | INTRAVENOUS | Status: AC
  Administered 2012-08-18: 2500 [IU] via INTRAVENOUS
  Filled 2012-08-18: qty 2500

## 2012-08-18 MED ORDER — HEPARIN (PORCINE) IN NACL 100-0.45 UNIT/ML-% IJ SOLN
1500.0000 [IU]/h | INTRAMUSCULAR | Status: DC
  Administered 2012-08-18: 1500 [IU]/h via INTRAVENOUS
  Filled 2012-08-18 (×3): qty 250

## 2012-08-18 MED ORDER — IOHEXOL 350 MG/ML SOLN
100.0000 mL | Freq: Once | INTRAVENOUS | Status: AC | PRN
  Administered 2012-08-18: 100 mL via INTRAVENOUS

## 2012-08-18 MED ORDER — POTASSIUM CHLORIDE CRYS ER 20 MEQ PO TBCR
40.0000 meq | EXTENDED_RELEASE_TABLET | Freq: Once | ORAL | Status: AC
  Administered 2012-08-18: 40 meq via ORAL
  Filled 2012-08-18: qty 2

## 2012-08-18 NOTE — Consult Note (Addendum)
Cardiology Consult Note  Admit date: 08/16/2012 Name: Terry [name] 66 y.o.  male DOB:  23-Nov-1945 MRN:  [id number]  Today's date:  08/18/2012  Referring Physician: Dr. Manson Passey    Reason for Consultation:    Dyspnea and possible congestive heart failure.  IMPRESSIONS: 1. Dyspnea is multifactorial and likely due to a combination of COPD, pulmonary hypertension, and possible diastolic dysfunction 2. Severe pulmonary hypertension-the differential includes either recent pulmonary emboli or cor pulmonale secondary to severe COPD 3. COPD 4. Hypertensive heart disease 5. Peripheral vascular disease with previous left iliac stent 6. Severe lumbar disc disease with chronic pain syndrome and multiple previous surgeries 7. Obesity 8. Diabetes mellitus non-insulin-dependent 9. Possible diastolic congestive heart failure with elevation of BNP 10. Trivial elevations of troponin  RECOMMENDATION: The patient on echocardiogram has preserved LV systolic function but has significant pulmonary hypertension. This could either be due to underlying lung disease or could be a recent pulmonary embolus. Suggest a set of lower extremity venous Dopplers and also evaluation for possible pulmonary embolus with either nuclear medicine scanning or if renal function were to permit it,  CT scan with contrast.  In the meantime go ahead and start heparin full dose for treatment of possible pulmonary embolus while warfarin is coming on board.  He does have vascular disease and should ideally be on a statin drug  Consider pulmonary consultation.  HISTORY: This 66 year old black male has a prior history of severe hypertension. He has seen Dr. Aleen Campi in the past but has not seen him for some time. He was diagnosed with peripheral vascular disease and had a left iliac stent placed in 2009. He later had some episodes of syncope and had an unremarkable workup including a negative echo as well as a negative event  monitor. He has not seen [facility] in a year or two When I asked about him seeing them again, he and his wife mentioned that they would like for me to continue to see him.  He has a 6 month history of lower extremity edema as well as progressive dyspnea. He has continued to smoke cigarettes and smokes about a pack-a-day and has at least a 50 year pack history. He also has known diabetes mellitus.  He fell and had a tibial fracture that was repaired and has also since had to have a urethral stricture taken care of by a bedside cystoscopy. He developed oxygen desaturations and some lower blood pressure.As well as some confusion.He has not had any chest discomfort suggestive of angina. He had mild elevations of troponin that were trivia and had sinus tachycardia with nonspecific changes on EKG. He has never had angina.  Past Medical History  Diagnosis Date  . Diabetes mellitus without complication   . Peripheral vascular disease   . Hypertensive heart disease   . Obesity (BMI 30-39.9)   . Lumbar disc disease      Past Surgical History  Procedure Date  . Back surgery     numerous spine surgeries, 1966-2011  . Hip replacement     bilateral, each replaced twice  . Tonsillectomy     Allergies:   has no known allergies.   Medications: Prior to Admission medications   Medication Sig Start Date End Date Taking? Authorizing Provider  losartan-hydrochlorothiazide (HYZAAR) 100-25 MG per tablet Take 1 tablet by mouth daily.   Yes Historical Provider, MD  METFORMIN HCL PO Take 1 tablet by mouth 3 (three) times daily.   Yes Historical Provider,  MD  metoprolol (LOPRESSOR) 50 MG tablet Take 50 mg by mouth 2 (two) times daily.   Yes Historical Provider, MD  oxycodone (OXY-IR) 5 MG capsule Take 20 mg by mouth 3 (three) times daily as needed. For pain   Yes Historical Provider, MD  oxyCODONE (OXYCONTIN) 40 MG 12 hr tablet Take 40 mg by mouth every 12 (twelve) hours.   Yes Historical  Provider, MD   Family History: Family Status  Relation Status Death Age  . Father Deceased 16    died of CAD  . Mother Deceased 105's    died of ASCVD  . Brother Deceased 79    died suddenly    Social History:   reports that he has been smoking Cigarettes.  He has a 50 pack-year smoking history. He has never used smokeless tobacco. He reports that he does not drink alcohol or use illicit drugs.   History   Social History Narrative   Divorced.   2 children. Remarried.  He is on disability and retired from [employer].   Review of Systems:  He has a severe chronic pain syndrome and has significant difficulty with extremity and leg pain. He has had urinary stricture recently addressed.He has had difficulty with some urinary retention and also severe bilateral hip pain. He has some chronic headaches. He has some mild indigestion and digestive issues.  Other than as noted above the remainder of the systems is unremarkable  Physical Exam: BP 107/64  Pulse 84  Temp 98.1 F (36.7 C) (Axillary)  Resp 22  Ht 6\' 1"  (1.854 m)  Wt 89.812 kg (198 lb)  BMI 26.12 kg/m2  SpO2 90%  General appearance: is a pleasant black male but was uncomfortable with his right leg and moving around trying to get relief. He was not in respiratory distress. Head: Normocephalic, without obvious abnormality, atraumatic, balding male hair pattern Eyes: conjunctivae/corneas clear. PERRL, EOM's intact. Fundi Not examined Neck: no adenopathy, no carotid bruit, no JVD and supple, symmetrical, trachea midline Lungs: reduced breath sounds bilaterally Heart: regular rate and rhythm, S1, S2 normal, no murmur, click, rub or gallop Abdomen: soft, non-tender; bowel sounds normal; no masses,  no organomegaly Rectal: deferred Extremities: 2+ peripheral edema, peripheral pulses diminished on the left and 1-2+ of the right Pulses: 2+ and symmetricFemoral pulses Skin: Skin color, texture, turgor normal. No rashes  or lesions Neurologic: Grossly normal  Labs: CBC  Basename 08/18/12 0145 08/16/12 2029  WBC 10.4 --  RBC 4.13* --  HGB 10.3* --  HCT 34.3* --  PLT 197 --  MCV 83.1 --  MCH 24.9* --  MCHC 30.0 --  RDW 22.0* --  LYMPHSABS -- 2.0  MONOABS -- 1.1*  EOSABS -- 0.1  BASOSABS -- 0.1   CMP   Basename 08/18/12 0145  NA 138  K 3.4*  CL 100  CO2 29  GLUCOSE 157*  BUN 36*  CREATININE 1.36*  CALCIUM 8.7  PROT 7.0  ALBUMIN 2.7*  AST 33  ALT 25  ALKPHOS 97  BILITOT 0.5  GFRNONAA 52*  GFRAA 61*   BNP (last 3 results)  Basename 08/17/12 0900  PROBNP 3818.0*   Cardiac Panel (last 3 results)  Basename 08/18/12 0145 08/17/12 1921 08/17/12 1324  CKTOTAL -- -- --  CKMB -- -- --  TROPONINI 0.43* 0.40* 0.48*  RELINDX -- -- --     Radiology: Interstitial edema and cardiomegaly  EKG: Sinus tachycardia, nonspecific changes laterally  ECHO Excellent systolic LV function with  EF of 60%.  Severe pulmonary hypertension with septal flattening. MIld RVE and RA enlargement.  Grade one diastolic dysfunction.  Signed:  Darden Palmer MD [facility]   Cardiology Consultant  08/18/2012, 4:15 PM

## 2012-08-18 NOTE — Evaluation (Signed)
Physical Therapy Evaluation Patient Details Name: Terry [name] MRN: [id number] DOB: Jul 11, 1946 Today's Date: 08/18/2012 Time: 4098-1191 PT Time Calculation (min): 36 min  PT Assessment / Plan / Recommendation Clinical Impression  Pt presents with distal tibia fracture s/p IM nail with possible ligament damage in knee from fall as well.  He has a history of 19 back surgeries and 4 previous hip surgeries.  Tolerated sitting EOB and sit to stand x 2 reps with +2 assist.  Attempted pivot to chair, however was unable to pivot or hop to chair.  Pt will benefit from skilled PT in acute venue to address deficits.  PT recommends SNF for follow up at D/C to maximize pts safety.     PT Assessment  Patient needs continued PT services    Follow Up Recommendations  Supervision/Assistance - 24 hour;SNF    Does the patient have the potential to tolerate intense rehabilitation      Barriers to Discharge None      Equipment Recommendations  Rolling walker with 5" wheels    Recommendations for Other Services     Frequency Min 3X/week    Precautions / Restrictions Precautions Precautions: Fall Restrictions Weight Bearing Restrictions: Yes RLE Weight Bearing: Non weight bearing Other Position/Activity Restrictions: No order for NWB, will assume NWB until notified otherwise.    Pertinent Vitals/Pain 9/10, RN aware, premedicated.       Mobility  Bed Mobility Bed Mobility: Supine to Sit;Sit to Supine Supine to Sit: 3: Mod assist;HOB elevated;With rails Sit to Supine: 1: +2 Total assist;HOB flat Sit to Supine: Patient Percentage: 20% Details for Bed Mobility Assistance: Assist for LEs out of bed with cues for using hands on bed instead of trapeze to prevent shoulder injury.  Assist for BLEs into bed and for trunk to ensure safety with cues for hand placement and adjusting hips once in bed.  Transfers Transfers: Sit to Stand;Stand to Sit Sit to Stand: 1: +2 Total assist;From elevated  surface;With upper extremity assist;From bed Sit to Stand: Patient Percentage: 30% Stand to Sit: 1: +2 Total assist;With upper extremity assist;To elevated surface;To bed Stand to Sit: Patient Percentage: 30% Details for Transfer Assistance: Performed sit to stand x 2 with assist to rise and maintain upright stance with max cues for hand placement, maintaining NWB status, and to maintain as upright posture as possible.  Attempted to pivot to chair, however pt unable to pivot or hop to chair.  Noted extremely forward flexed posture with standing.  Ambulation/Gait Ambulation/Gait Assistance: Not tested (comment) Stairs: No Wheelchair Mobility Wheelchair Mobility: No    Shoulder Instructions     Exercises     PT Diagnosis: Difficulty walking;Generalized weakness;Acute pain  PT Problem List: Decreased strength;Decreased range of motion;Decreased activity tolerance;Decreased balance;Decreased mobility;Decreased knowledge of use of DME;Pain;Decreased knowledge of precautions PT Treatment Interventions: DME instruction;Gait training;Functional mobility training;Therapeutic activities;Therapeutic exercise;Balance training;Patient/family education   PT Goals Acute Rehab PT Goals PT Goal Formulation: With patient/family Time For Goal Achievement: 09/01/12 Potential to Achieve Goals: Fair Pt will go Supine/Side to Sit: with supervision PT Goal: Supine/Side to Sit - Progress: Goal set today Pt will go Sit to Supine/Side: with min assist PT Goal: Sit to Supine/Side - Progress: Goal set today Pt will go Sit to Stand: with mod assist PT Goal: Sit to Stand - Progress: Goal set today Pt will go Stand to Sit: with mod assist PT Goal: Stand to Sit - Progress: Goal set today Pt will Transfer Bed to Chair/Chair to Bed:  with max assist PT Transfer Goal: Bed to Chair/Chair to Bed - Progress: Goal set today Pt will Ambulate: 1 - 15 feet;with +2 total assist;with least restrictive assistive device (pt  assist 50%) PT Goal: Ambulate - Progress: Goal set today  Visit Information  Last PT Received On: 08/18/12 Assistance Needed: +2    Subjective Data  Subjective: I'm too old for this.  Patient Stated Goal: to get back home   Prior Functioning  Home Living Lives With: Spouse Available Help at Discharge: Family;Available PRN/intermittently Type of Home: House Home Access: Stairs to enter [employer] of Steps: 1 Home Layout: One level Bathroom Shower/Tub: Health visitor: Standard Home Adaptive Equipment: Clinical research associate - four wheeled Prior Function Level of Independence: Independent Vocation: Retired Musician: No difficulties Dominant Hand: Right    Cognition  Overall Cognitive Status: Appears within functional limits for tasks assessed/performed Arousal/Alertness: Awake/alert Orientation Level: Appears intact for tasks assessed Behavior During Session: [facility] for tasks performed    Extremity/Trunk Assessment Right Lower Extremity Assessment RLE ROM/Strength/Tone: Unable to fully assess;Due to pain;Due to precautions Left Lower Extremity Assessment LLE ROM/Strength/Tone: WFL for tasks assessed LLE Sensation: WFL - Light Touch Trunk Assessment Trunk Assessment: Kyphotic;Other exceptions Trunk Exceptions: Pt has had 19 back surgeries    Balance Balance Balance Assessed: Yes Static Sitting Balance Static Sitting - Balance Support: Bilateral upper extremity supported Static Sitting - Level of Assistance: 5: Stand by assistance  End of Session PT - End of Session Equipment Utilized During Treatment: Gait belt;Other (comment) (soft brace on RLE) Activity Tolerance: Patient limited by pain Patient left: in bed;with call bell/phone within reach;with family/visitor present  GP     Page, Meribeth Mattes 08/18/2012, 2:26 PM

## 2012-08-18 NOTE — Progress Notes (Signed)
ANTICOAGULATION CONSULT NOTE - Initial Consult  Pharmacy Consult for IV heparin Indication: pulmonary embolus  No Known Allergies  Patient Measurements: Height: 6\' 1"  (185.4 cm) Weight: 198 lb (89.812 kg) (Patient has trapeze equipment.  Weight stated by patient.) IBW/kg (Calculated) : 79.9   Vital Signs: Temp: 99.2 F (37.3 C) (12/02 1600) Temp src: Oral (12/02 1600) BP: 107/64 mmHg (12/02 1400) Pulse Rate: 84  (12/02 1400)  Labs:  Basename 08/18/12 0145 08/17/12 1921 08/17/12 1324 08/17/12 0905 08/16/12 2029  HGB 10.3* -- -- -- 10.7*  HCT 34.3* -- -- -- 35.4*  PLT 197 -- -- -- 217  APTT -- -- -- -- --  LABPROT -- -- -- -- 17.8*  INR -- -- -- -- 1.51*  HEPARINUNFRC -- -- -- -- --  CREATININE 1.36* -- -- 1.25 1.48*  CKTOTAL -- -- -- -- --  CKMB -- -- -- -- --  TROPONINI 0.43* 0.40* 0.48* -- --    Estimated Creatinine Clearance: 59.6 ml/min (by C-G formula based on Cr of 1.36).   Medical History: Past Medical History  Diagnosis Date  . Diabetes mellitus without complication   . Peripheral vascular disease   . Hypertensive heart disease   . Obesity (BMI 30-39.9)   . Lumbar disc disease     Medications:  Scheduled:    . antiseptic oral rinse  15 mL Mouth Rinse BID  . [COMPLETED]  ceFAZolin (ANCEF) IV  2 g Intravenous Q6H  . docusate sodium  100 mg Oral BID  . [EXPIRED] hydrALAZINE      . [COMPLETED] influenza  inactive virus vaccine  0.5 mL Intramuscular Tomorrow-1000  . insulin aspart  0-15 Units Subcutaneous TID WC  . metoprolol  100 mg Oral BID  . OxyCODONE  40 mg Oral Q12H  . [COMPLETED] pneumococcal 23 valent vaccine  0.5 mL Intramuscular Tomorrow-1000  . [COMPLETED] potassium chloride  40 mEq Oral Once  . [DISCONTINUED] enoxaparin (LOVENOX) injection  30 mg Subcutaneous Q12H  . [DISCONTINUED] furosemide  40 mg Intravenous Q24H  . [DISCONTINUED] hydrochlorothiazide  25 mg Oral Daily  . [DISCONTINUED] losartan  100 mg Oral Daily   Infusions:     Assessment:  66 yo male with hx DM and HTN s/p fall on 11/30 now post op ORIF of distal tibia fracture POD2  Patient had been experiencing low oxygen saturation and found to have PE to start heparin per pharmacy dosing  Baseline INR elevated at 1.51  CBC stable  Goal of Therapy:  Heparin level 0.3-0.7 units/ml Monitor platelets by anticoagulation protocol: Yes   Plan:  1) 2500 unit IV heparin bolus then 2) IV heparin 1500 units/hr 3) Check heparin level 6 hrs after starting heparin 4) Daily heparin level/CBC   Hessie Knows, PharmD, BCPS Pager [phone] 08/18/2012 5:03 PM

## 2012-08-18 NOTE — Op Note (Signed)
NAMECARWYN, Terry [name]                  ACCOUNT NO.:  [id number]  MEDICAL RECORD NO.:  [id number]  LOCATION:  1235                         FACILITY:  [facility]  PHYSICIAN:  Sebastian Ache, MD     DATE OF BIRTH:  1946/05/24  DATE OF PROCEDURE:08/17/2012 DATE OF DISCHARGE:                              OPERATIVE REPORT   PREOPERATIVE DIAGNOSES: 1. Urinary retention. 2. Urethral stricture. 3. Congestive heart failure with need for close urine output     monitoring.  PROCEDURE: 1. Cystoscopy with balloon dilation and urethral stricture. 2. Catheter placement, complicated, this was performed at bedside.  FINDINGS: 1. High-grade distal urethral stricture. 2. Bilobar prostatic hypertrophy. 3. Very cloudy urine in the urinary bladder limits the sensitivity for     detection of masses.  SPECIMEN:  None.  DRAINS:  A 16-French Councill catheter straight drain.  INDICATIONS:  Terry [name] is a pleasant 66 year old gentleman who sustained a spiral right tibular-fibular fracture that was operated on by Dr. Otelia Sergeant late last night.  He subsequently developed congestive heart failure with need for close urine output monitoring.  He is able to void only very very small amounts with some associated pain, several attempts at bedside catheter placement per nursing were unsuccessful and consultation was sought.  I examined the patient and found him to have moderate phimosis but not enough to explain inability to pass catheter, single attempt at placement of a 16-French catheter was performed and was unsuccessful due to resistance of the distal urethra.  It was felt that bedside cystoscopy, possible urethral dilation was warranted.  This was discussed with the patient and the patient's wife, he wished to proceed.  PROCEDURE ON DETAIL:  The patient being Terry [name], was verified.  The procedure being bedside cysto and urethral dilation was confirmed, procedure was carried out.  Sterile field was created by  prepping and draping the patient's penis, perineum, and proximal thighs using iodine.  Next, a cystourethroscopy was performed using a 16-French flexible cystoscope. Inspection of the fossa navicularis is unremarkable immediately proximal to this in the distal quarter of the penile urethra, a very high grade urethral stricture was noted with only a pinhole opening of the true lumen.  A Sensor wire was advanced through this for a distance approximately 12 inches over which, a new 24-French urethral dilation balloon apparatus was carefully positioned.  This was inflated to a pressure of 18 atmospheres held for 90 seconds, then released. Cystoscopy was performed proximal to this.  No additional strictures were found.  There was excellent continuity and caliber of the urethra up to the level of the urinary bladder.  Mild bilobar prostatic hypertrophy was noted.  Inspection of bladder revealed a very distended bladder with very cloudy urine.  This limited dissection of the masses.  Cystoscope was withdrawn and a new 16-French Councill catheter was placed over the Sensor wire to the level of urinary bladder.  Vigorous efflux of urine was seen.  Immediately, 800 mL of urine was put out.  Procedure was then terminated.  The patient tolerated the procedure well.  There were no periprocedural complications.  The patient remained in the ICU.  ______________________________ Sebastian Ache, MD     TM/MEDQ  D:  08/17/2012  T:  08/18/2012  Job:  [id number]

## 2012-08-18 NOTE — Clinical Social Work Psychosocial (Signed)
Clinical Social Work Department BRIEF PSYCHOSOCIAL ASSESSMENT 08/18/2012  Patient:  DELOY, ARCHEY     Account Number:  [id number]     Admit date:  08/16/2012  Clinical Social Worker:  Jodelle Red  Date/Time:  08/18/2012 10:01 AM  Referred by:  Physician  Date Referred:  08/17/2012 Referred for  SNF Placement   Other Referral:   Interview type:  Patient Other interview type:   WIFE AND RN    PSYCHOSOCIAL DATA Living Status:  SIGNIFICANT OTHER Admitted from facility:   Level of care:   Primary support name:  Bonita Quin Grooms Primary support relationship to patient:  PARTNER Degree of support available:   good from girlfriend. She plans to assist Pt at d/c.    CURRENT CONCERNS Current Concerns  None Noted   Other Concerns:    SOCIAL WORK ASSESSMENT / PLAN CSW met with Pt and wife due to possible SNF need after ankle fracture. Pt lives at home with wife who reports she can assist at home. Pt and wife refuse SNF as d/c dispo currently. CSW explained PT will assess further and may recommend SNF. Pt and wife both plan to manage things at home. THey report they have used [facility] for DME in the past and would like to use them again.   Assessment/plan status:  No Further Intervention Required Other assessment/ plan:   Information/referral to community resources:   Pt made aware of Medicare coverage for SNF.    PATIENT'S/FAMILY'S RESPONSE TO PLAN OF CARE: Pt and wife decline SNF currently. PT has yet to assess due to medical concerns. CSW signing off, please reconsult if PT recommends SNF.   Doreen Salvage, LCSW ICU/Stepdown Clinical Social Worker [facility] Cell [phone] Hours 8am-1200pm M-F

## 2012-08-18 NOTE — Progress Notes (Signed)
Nutrition Brief Note  Patient identified on the Malnutrition Screening Tool (MST) Report  Body mass index is 26.12 kg/(m^2). Pt meets criteria for overweight based on current BMI.  Lab Results  Component Value Date   HGBA1C 7.6* 08/16/2012   HgBA1C elevated compared to value 2 years ago (6.9% 09/2010).  Current diet order is CHO Modified Medium, patient is consuming approximately 75% of meals at this time per his report. Labs and medications reviewed.   Pt reports his appetite and intake are per his usual.  Pt denies nutrition-related needs or education needs at this time.  Discussed increased needs r/t healing with pt.  No nutrition interventions warranted at this time. If nutrition issues arise, please consult RD.   Loyce Dys, MS RD LDN Clinical Inpatient Dietitian Pager: [phone] Weekend/After hours pager: [phone]

## 2012-08-18 NOTE — Progress Notes (Addendum)
Subjective: 1 Day Post-Op Procedure(s) (LRB): INTRAMEDULLARY (IM) NAIL TIBIAL (Right) Awake, alert, Ox4. Much more awake and alert. Foley to SD for diuresis Patient reports pain as moderate.    Objective:   VITALS:  Temp:  [98.1 F (36.7 C)-99.3 F (37.4 C)] 98.1 F (36.7 C) (12/02 1200) Pulse Rate:  [72-91] 90  (12/02 1200) Resp:  [13-24] 19  (12/02 1200) BP: (106-168)/(41-85) 106/74 mmHg (12/02 1200) SpO2:  [89 %-100 %] 96 % (12/02 1200) FiO2 (%):  [40 %] 40 % (12/01 1500)  ABD soft Sensation intact distally Dorsiflexion/Plantar flexion intact Incision: dressing C/D/I Compartment soft   LABS  Basename 08/18/12 0145 08/16/12 2029  HGB 10.3* 10.7*  WBC 10.4 8.2  PLT 197 217    Basename 08/18/12 0145 08/17/12 0905  NA 138 140  K 3.4* 3.7  CL 100 102  CO2 29 25  BUN 36* 41*  CREATININE 1.36* 1.25  GLUCOSE 157* 112*    Basename 08/16/12 2029  LABPT --  INR 1.51*     Assessment/Plan: 1 Day Post-Op Procedure(s) (LRB): INTRAMEDULLARY (IM) NAIL TIBIAL (Right) Fluid overload/CHF?  Advance diet Up with therapy Continue foley due to bladder outlet obstruction and diuresis and strict I&O Discharge to SNF  Victory Dresden E 08/18/2012, 1:42 PM

## 2012-08-18 NOTE — Evaluation (Signed)
Occupational Therapy Evaluation Patient Details Name: Terry [name] MRN: [id number] DOB: Dec 12, 1945 Today's Date: 08/18/2012 Time: 2952-8413 OT Time Calculation (min): 39 min  OT Assessment / Plan / Recommendation Clinical Impression  Pt presents with distal tibia fracture s/p IM nail with possible ligament damage in knee from fall as well.  He has a history of 19 back surgeries and 4 previous hip surgeries. Skilled OT indicated to maximize independence with BADLs to mod A level in prep for d/c to next venue of care.    OT Assessment  Patient needs continued OT Services    Follow Up Recommendations  SNF    Barriers to Discharge Decreased caregiver support    Equipment Recommendations  Rolling walker with 5" wheels;3 in 1 bedside comode    Recommendations for Other Services    Frequency  Min 2X/week    Precautions / Restrictions Precautions Precautions: Fall Restrictions Weight Bearing Restrictions: Yes RLE Weight Bearing: Non weight bearing Other Position/Activity Restrictions: No order for NWB, will assume NWB until notified otherwise.    Pertinent Vitals/Pain Pt reported 9/10 pain in R knee. Repositioned for comfort.    ADL  Grooming: Simulated;Set up Where Assessed - Grooming: Unsupported sitting Upper Body Bathing: Simulated;Set up Where Assessed - Upper Body Bathing: Unsupported sitting Lower Body Bathing: Simulated;+2 Total assistance Lower Body Bathing: Patient Percentage: 20% Where Assessed - Lower Body Bathing: Supported sit to stand Upper Body Dressing: Simulated;Set up Where Assessed - Upper Body Dressing: Unsupported sitting Lower Body Dressing: Simulated;+2 Total assistance Lower Body Dressing: Patient Percentage: 10% Where Assessed - Lower Body Dressing: Supported sit to stand Toilet Transfer: Simulated;+2 Total assistance Toilet Transfer: Patient Percentage: 30% Statistician Method: Sit to stand Toileting - Architect and Hygiene:  Simulated;+2 Total assistance Toileting - Architect and Hygiene: Patient Percentage: 10% Where Assessed - Glass blower/designer Manipulation and Hygiene: Standing Equipment Used: Rolling walker;Gait belt Transfers/Ambulation Related to ADLs: Pt was unable to take any pivotal steps to chair. Limited by pain and fatigue.    OT Diagnosis: Generalized weakness  OT Problem List: Decreased activity tolerance;Pain;Decreased knowledge of use of DME or AE;Decreased knowledge of precautions OT Treatment Interventions: Self-care/ADL training;Therapeutic activities;DME and/or AE instruction;Patient/family education   OT Goals Acute Rehab OT Goals OT Goal Formulation: With patient/family Time For Goal Achievement: 09/01/12 Potential to Achieve Goals: Good ADL Goals Pt Will Perform Grooming: Standing at sink;Other (comment) (with min steadying assist.) ADL Goal: Grooming - Progress: Goal set today Pt Will Perform Lower Body Bathing: with mod assist;Sit to stand from chair;Sit to stand from bed ADL Goal: Lower Body Bathing - Progress: Goal set today Pt Will Perform Lower Body Dressing: with mod assist;Sit to stand from bed;Sit to stand from chair ADL Goal: Lower Body Dressing - Progress: Goal set today Pt Will Transfer to Toilet: with mod assist;with DME;Stand pivot transfer ADL Goal: Toilet Transfer - Progress: Goal set today Pt Will Perform Toileting - Clothing Manipulation: with mod assist;Sitting on 3-in-1 or toilet;Standing ADL Goal: Toileting - Clothing Manipulation - Progress: Goal set today Pt Will Perform Toileting - Hygiene: with mod assist;Sit to stand from 3-in-1/toilet ADL Goal: Toileting - Hygiene - Progress: Goal set today  Visit Information  Last OT Received On: 08/18/12 Assistance Needed: +2 PT/OT Co-Evaluation/Treatment: Yes    Subjective Data  Subjective: I don't think I can do this today. Patient Stated Goal: return home with wife.   Prior Functioning     Home  Living Lives With: Spouse Available Help at  Discharge: Family;Available PRN/intermittently Type of Home: House Home Access: Stairs to enter [employer] of Steps: 1 Home Layout: One level Bathroom Shower/Tub: Health visitor: Standard Home Adaptive Equipment: Clinical research associate - four wheeled Prior Function Level of Independence: Independent Vocation: Retired Musician: No difficulties Dominant Hand: Right         Vision/Perception     Cognition  Overall Cognitive Status: Appears within functional limits for tasks assessed/performed Arousal/Alertness: Awake/alert Orientation Level: Appears intact for tasks assessed Behavior During Session: [facility] for tasks performed    Extremity/Trunk Assessment Right Upper Extremity Assessment RUE ROM/Strength/Tone: [facility] for tasks assessed Left Upper Extremity Assessment LUE ROM/Strength/Tone: WFL for tasks assessed Right Lower Extremity Assessment RLE ROM/Strength/Tone: Unable to fully assess;Due to pain;Due to precautions Left Lower Extremity Assessment LLE ROM/Strength/Tone: WFL for tasks assessed LLE Sensation: WFL - Light Touch Trunk Assessment Trunk Assessment: Kyphotic;Other exceptions Trunk Exceptions: Pt has had 19 back surgeries      Mobility Bed Mobility Bed Mobility: Supine to Sit;Sit to Supine Supine to Sit: 3: Mod assist;HOB elevated;With rails Sit to Supine: 1: +2 Total assist;HOB flat Sit to Supine: Patient Percentage: 20% Details for Bed Mobility Assistance: Assist for LEs out of bed with cues for using hands on bed instead of trapeze to prevent shoulder injury.  Assist for BLEs into bed and for trunk to ensure safety with cues for hand placement and adjusting hips once in bed.  Transfers Sit to Stand: 1: +2 Total assist;From elevated surface;With upper extremity assist;From bed Sit to Stand: Patient Percentage: 30% Stand to Sit: 1: +2 Total assist;With upper extremity  assist;To elevated surface;To bed Stand to Sit: Patient Percentage: 30% Details for Transfer Assistance: Performed sit to stand x 2 with assist to rise and maintain upright stance with max cues for hand placement, maintaining NWB status, and to maintain as upright posture as possible.  Attempted to pivot to chair, however pt unable to pivot or hop to chair.  Noted extremely forward flexed posture with standing.      Shoulder Instructions     Exercise     Balance Balance Balance Assessed: Yes Static Sitting Balance Static Sitting - Balance Support: Bilateral upper extremity supported Static Sitting - Level of Assistance: 5: Stand by assistance   End of Session OT - End of Session Equipment Utilized During Treatment: Gait belt Activity Tolerance: Patient limited by fatigue;Patient limited by pain Patient left: in bed;with call bell/phone within reach;with family/visitor present  GO     Lemma Tetro A OTR/L [phone] 08/18/2012, 3:43 PM

## 2012-08-18 NOTE — Consult Note (Addendum)
[facility] PROGRESS NOTE  Terry [name] ZOX:[id number] DOB: 02-08-1946 DOA: 08/16/2012 PCP: No primary provider on file.  Requesting physician: Dr. Otelia Sergeant (Orthopedics)  Date of consultation: 08/17/2012  Reason for consultation: confusion and low oxygen saturation   HPI:  66 year old male with history of HTN and DM who presented 08/16/2012 status post fall in his office, has sustained right tibial distal fracture, oblique spiral tibia and proximal nondisplaced fibula neck fracture. Patient is now status post ORIF of distal tibia fracture (POD #2). Orthopedics requested internal medicine consult as patient ahd low O2 saturation in PACU and confusion. In addition, CXR done 08/17/2012 showed worsening interstitial edema and congestive heart failure. Cardiac enzymes are trending down from 0.55-0.43. 2-D echo was done 08/17/2012 and showed normal ejection fraction. Cardiology consult requested for input on management.  Impression/Recommendations:   Principal Problem:  *Fracture of tibia, distal, right, closed  Status post ORIF of distal tibia fracture (POD #2)  Management per ortho team  Active Problems:  Hypoxic respiratory failure  Obvious concern is pulmonary embolism as well as due interstitial edema and CHF We wanted to do perfusion lung scan but patient could not lay down flat and he has refused to do the scan Blood gas was done and it shows respiratory acidosis. We have started BiPAP as needed. Respiratory status is stable and patient saturating above 90% with simple oxygen mask  We have given one dose of Lasix 08/17/2012 IV fluids only KVO  Acute diastolic congestive heart failure  Appreciate cardiology consult  2-D echo done 08/17/2012 showed ejection fraction 65%  BNP elevated at 3818  We have given one dose of Lasix 40 mg IV and this is now changed to daily regimen  Continue metoprolol 100 mg twice daily  Continue Cozaar 100 mg daily  Cardiac enzymes trended down  from 0.55 to 0.43 Confusion  Mental status seems to be at baseline Diabetes mellitus type 2 with peripheral artery disease  A1c this admission 7.6, above the goal for good glucose control  Continue sliding scale insulin Continue insulin 2 units 3 times a day with meals Hypertension  Continue losartan and metoprolol  Continue HCTZ 25 mg daily Anemia  Likely of chronic disease  No signs of active bleed  Continue to monitor CBC Acute kidney injury  Perhaps prerenal and now possibly due to Lasix Continue to monitor kidney function Coagulopathy  Unclear etiology  No signs of active bleed  Follow up INR in am  I will followup again tomorrow. Please contact me if I can be of assistance in the meanwhile. Thank you for this consultation.   Manson Passey  [facility]  Pager [phone]  Other consultants:  Cardiology (Dr. Sondra Come)  Urology (Dr. Berneice Heinrich)  If 7PM-7AM, please contact night-coverage [email or web address] Password [email or web address] 08/18/2012, 7:04 AM   LOS: 2 days   HPI/Subjective: No acute events overnight.  Objective: Filed Vitals:   08/18/12 0000 08/18/12 0016 08/18/12 0400 08/18/12 0406  BP: 148/77  107/85   Pulse: 86  75   Temp:  99.1 F (37.3 C)  98.8 F (37.1 C)  TempSrc:  Axillary  Axillary  Resp: 14  19   Height:      Weight:      SpO2: 95%  93%     Intake/Output Summary (Last 24 hours) at 08/18/12 0704 Last data filed at 08/18/12 0500  Gross per 24 hour  Intake   1584 ml  Output   4435 ml  Net  -2851 ml  Exam:   General:  Pt is alert, follows commands appropriately, not in acute distress  Cardiovascular: Regular rate and rhythm, S1/S2, no murmurs, no rubs, no gallops  Respiratory: Clear to auscultation bilaterally, no wheezing, no crackles, no rhonchi  Abdomen: Soft, non tender, non distended, bowel sounds present, no guarding  Extremities: No edema, pulses palpable bilaterally  Neuro: Grossly nonfocal  Data Reviewed: Basic Metabolic  Panel:  Lab 08/18/12 0145 08/17/12 0905 08/16/12 2029  NA 138 140 139  K 3.4* 3.7 3.6  CL 100 102 101  CO2 29 25 26   GLUCOSE 157* 112* 81  BUN 36* 41* 50*  CREATININE 1.36* 1.25 1.48*  CALCIUM 8.7 8.6 8.8  MG 2.1 -- --   Liver Function Tests:  Lab 08/18/12 0145 08/17/12 0905 08/16/12 2029  AST 33 49* 57*  ALT 25 45 54*  ALKPHOS 97 103 118*  BILITOT 0.5 0.5 0.6  PROT 7.0 7.5 8.0  ALBUMIN 2.7* 2.9* 3.2*   CBC:  Lab 08/18/12 0145 08/16/12 2029  WBC 10.4 8.2  NEUTROABS -- 4.9  HGB 10.3* 10.7*  HCT 34.3* 35.4*  MCV 83.1 82.5  PLT 197 217   Cardiac Enzymes:  Lab 08/18/12 0145 08/17/12 1921 08/17/12 1324 08/17/12 0900  CKTOTAL -- -- -- --  CKMB -- -- -- --  CKMBINDEX -- -- -- --  TROPONINI 0.43* 0.40* 0.48* 0.55*   CBG:  Lab 08/17/12 2141 08/17/12 1650 08/17/12 1216 08/17/12 0913 08/17/12 0803  GLUCAP 133* 256* 120* 110* 106*    Recent Results (from the past 240 hour(s))  MRSA PCR SCREENING     Status: Normal   Collection Time   08/17/12  9:08 AM      Component Value Range Status Comment   MRSA by PCR NEGATIVE  NEGATIVE Final      Studies: Dg Chest 1 View 08/17/2012  * IMPRESSION: Slight worsening of interstitial pulmonary edema since yesterday, consistent with CHF and/or fluid overload.     Dg Tibia/fibula Right 08/17/2012   IMPRESSION: ORIF of distal tibia fracture without evidence of immediate complication as above.  Archer Asa, M.D.    Dg Tibia/fibula Right 08/16/2012  * IMPRESSION:  1.  Oblique, comminuted fracture involving the distal tibial metaphysis with nondisplaced fractures more distally in the metaphysis that do not extend to the articular surface. 2.  Nondisplaced fracture involving the medial cortex of the proximal fibular metaphysis. 3.  CPPD arthropathy involving the knee.   Dg Ankle Complete Right 08/16/2012  * IMPRESSION: Oblique, comminuted fracture involving the distal tibial metaphysis with nondisplaced fractures more distally in the  metaphysis that do not extend to the articular surface. No other fractures.     Dg Chest Port 1 View 08/16/2012  *  IMPRESSION: Moderate interstitial edema and cardiomegaly.     Dg C-arm 1-60 Min-no Report 08/17/2012  CLINICAL DATA: tib fib fracture   C-ARM 1-60 MINUTES  Fluoroscopy was utilized by the requesting physician.  No radiographic  interpretation.      Scheduled Meds:   . docusate sodium  100 mg Oral BID  . enoxaparin (LOVENOX)   30 mg Subcutaneous Q12H  . furosemide  40 mg Intravenous Q24H  . losartan  100 mg Oral Daily   And  . hydrochlorothiazide  25 mg Oral Daily  . insulin aspart  0-15 Units Subcutaneous TID WC  . metoprolol  100 mg Oral BID  . OxyCODONE  40 mg Oral Q12H  . potassium chloride  40 mEq Oral Once

## 2012-08-18 NOTE — Progress Notes (Signed)
1 Day Post-Op  Subjective: 1 - Urethral Stricture  - Day 1 s/p bedside cysto and balloon dilation of high-grade distal penile urethral stricture. No interval problems with foley.  Objective: Vital signs in last 24 hours: Temp:  [98.1 F (36.7 C)-99.3 F (37.4 C)] 99.2 F (37.3 C) (12/02 1600) Pulse Rate:  [72-91] 84  (12/02 1400) Resp:  [13-22] 22  (12/02 1400) BP: (106-148)/(41-85) 107/64 mmHg (12/02 1400) SpO2:  [90 %-98 %] 90 % (12/02 1400) Last BM Date: 08/17/12  Intake/Output from previous day: 12/01 0701 - 12/02 0700 In: 1584 [P.O.:630; I.V.:600; IV Piggyback:204] Out: 4435 [Urine:4435] Intake/Output this shift: Total I/O In: -  Out: 900 [Urine:900]  General appearance: alert and cooperative Head: Normocephalic, without obvious abnormality, atraumatic Male genitalia: normal, foley c/d/i. Moderate phimosis. Small amt dried blood at meatsu as expected. urine clear and yellow in drain bag. Lymph nodes: Cervical, supraclavicular, and axillary nodes normal. Neurologic: Grossly normal  Lab Results:   Basename 08/18/12 0145 08/16/12 2029  WBC 10.4 8.2  HGB 10.3* 10.7*  HCT 34.3* 35.4*  PLT 197 217   BMET  Basename 08/18/12 0145 08/17/12 0905  NA 138 140  K 3.4* 3.7  CL 100 102  CO2 29 25  GLUCOSE 157* 112*  BUN 36* 41*  CREATININE 1.36* 1.25  CALCIUM 8.7 8.6   PT/INR  Basename 08/16/12 2029  LABPROT 17.8*  INR 1.51*   ABG  Basename 08/17/12 0928  PHART 7.312*  HCO3 23.6    Studies/Results: Dg Chest 1 View  08/17/2012  *RADIOLOGY REPORT*  Clinical Data: Shortness of breath postoperatively.  PORTABLE CHEST - 1 VIEW 08/17/2012 0656 hours:  Comparison: Portable chest x-ray yesterday.  Findings: Cardiac silhouette moderately enlarged but stable. Pulmonary venous hypertension and moderate interstitial pulmonary edema, slightly increased since the examination yesterday.  This is superimposed upon baseline chronic lung disease.  No confluent airspace  consolidation.  Probable small right pleural effusion.  IMPRESSION: Slight worsening of interstitial pulmonary edema since yesterday, consistent with CHF and/or fluid overload.   Original Report Authenticated By: Hulan Saas, M.D.    Dg Tibia/fibula Right  08/17/2012  *RADIOLOGY REPORT*  Clinical Data: ORIF tib-fib fracture  RIGHT TIBIA AND FIBULA - 2 VIEW  Comparison: Preoperative radiographs 08/16/2012  Findings: Multiple intraoperative spot radiographs obtained during ORIF of distal tibia fracture with intramedullary rod with to proximal and two distal locking screws.  Alignment of the fracture fragments appears improved compared to the preoperative radiographs.  No evidence of immediate hardware complication.  IMPRESSION: ORIF of distal tibia fracture without evidence of immediate complication as above.   Original Report Authenticated By: Malachy Moan, M.D.    Dg Tibia/fibula Right  08/16/2012  *RADIOLOGY REPORT*  Clinical Data: Larey Seat out of a wheelchair and injured the right lower leg and ankle.  RIGHT TIBIA AND FIBULA - 2 VIEW  Comparison: Right ankle x-rays obtained concurrently.  Findings: Oblique comminuted fracture involving the distal tibial metaphysis.  Nondisplaced fractures extending more inferiorly in the metaphysis, though these do not extend to the articular surface.  No fractures elsewhere involving the tibia.  Nondisplaced fracture involving the proximal fibular metaphysis along its medial cortical surface.  Chondrocalcinosis in the medial meniscus of the knee.  Mild medial compartment joint space narrowing.  IMPRESSION:  1.  Oblique, comminuted fracture involving the distal tibial metaphysis with nondisplaced fractures more distally in the metaphysis that do not extend to the articular surface. 2.  Nondisplaced fracture involving the medial cortex of the  proximal fibular metaphysis. 3.  CPPD arthropathy involving the knee.   Original Report Authenticated By: Hulan Saas, M.D.      Dg Ankle Complete Right  08/16/2012  *RADIOLOGY REPORT*  Clinical Data: Larey Seat out of a wheelchair and injured the right lower leg and ankle.  RIGHT ANKLE - COMPLETE 3+ VIEW  Comparison: Right tibia-fibula x-rays obtained concurrently.  Findings: Comminuted oblique fracture involving the distal tibial metaphysis, with a vertically oriented linear fracture extending more distally as well as a nondisplaced oblique fracture in the opposite direction more distally.  The fracture line does not appear to involve the articular surface.  No fractures about the ankle joint proper.  Ankle mortise intact.  Calcification in the soft tissues posterior to the ankle joint which are likely vascular.  IMPRESSION: Oblique, comminuted fracture involving the distal tibial metaphysis with nondisplaced fractures more distally in the metaphysis that do not extend to the articular surface. No other fractures.   Original Report Authenticated By: Hulan Saas, M.D.    Dg Chest Port 1 View  08/16/2012  *RADIOLOGY REPORT*  Clinical Data: Preoperative respiratory exam for distal tibial fracture on the right.  PORTABLE CHEST - 1 VIEW  Comparison: 03/27/2010  Findings: Moderate interstitial pulmonary edema present superimposed on chronic lung disease.  Stable cardiomegaly.  No large pleural effusions.  IMPRESSION: Moderate interstitial edema and cardiomegaly.   Original Report Authenticated By: Irish Lack, M.D.    Dg C-arm 1-60 Min-no Report  08/17/2012  CLINICAL DATA: tib fib fracture   C-ARM 1-60 MINUTES  Fluoroscopy was utilized by the requesting physician.  No radiographic  interpretation.      Anti-infectives: Anti-infectives     Start     Dose/Rate Route Frequency Ordered Stop   08/17/12 0901   ceFAZolin (ANCEF) 3 g in dextrose 5 % 50 mL IVPB        3 g 160 mL/hr over 30 Minutes Intravenous 60 min pre-op 08/17/12 0901 08/17/12 1119   08/17/12 0630   ceFAZolin (ANCEF) IVPB 2 g/50 mL premix        2 g 100 mL/hr  over 30 Minutes Intravenous Every 6 hours 08/17/12 0322 08/17/12 1816          Assessment/Plan:  1 - Urethral Stricture  - Foley to stay x 4 more days, then trial of void. Explained at least 50% recurrence risk of stricture.    LOS: 2 days    [facility], Keisa Blow 08/18/2012

## 2012-08-19 ENCOUNTER — Encounter (HOSPITAL_COMMUNITY): Payer: Self-pay | Admitting: Specialist

## 2012-08-19 DIAGNOSIS — I5033 Acute on chronic diastolic (congestive) heart failure: Secondary | ICD-10-CM | POA: Diagnosis present

## 2012-08-19 DIAGNOSIS — R609 Edema, unspecified: Secondary | ICD-10-CM

## 2012-08-19 DIAGNOSIS — I2789 Other specified pulmonary heart diseases: Secondary | ICD-10-CM

## 2012-08-19 DIAGNOSIS — R0602 Shortness of breath: Secondary | ICD-10-CM

## 2012-08-19 DIAGNOSIS — I119 Hypertensive heart disease without heart failure: Secondary | ICD-10-CM

## 2012-08-19 DIAGNOSIS — E669 Obesity, unspecified: Secondary | ICD-10-CM

## 2012-08-19 DIAGNOSIS — J449 Chronic obstructive pulmonary disease, unspecified: Secondary | ICD-10-CM

## 2012-08-19 LAB — COMPREHENSIVE METABOLIC PANEL
BUN: 41 mg/dL — ABNORMAL HIGH (ref 6–23)
CO2: 28 mEq/L (ref 19–32)
Calcium: 8.9 mg/dL (ref 8.4–10.5)
Chloride: 96 mEq/L (ref 96–112)
Creatinine, Ser: 1.56 mg/dL — ABNORMAL HIGH (ref 0.50–1.35)
GFR calc Af Amer: 51 mL/min — ABNORMAL LOW (ref >90)
GFR calc non Af Amer: 44 mL/min — ABNORMAL LOW (ref >90)
Glucose, Bld: 163 mg/dL — ABNORMAL HIGH (ref 70–99)
Total Bilirubin: 0.4 mg/dL (ref 0.3–1.2)

## 2012-08-19 LAB — CBC
Hemoglobin: 10 g/dL — ABNORMAL LOW (ref 13.0–17.0)
MCH: 25.6 pg — ABNORMAL LOW (ref 26.0–34.0)
Platelets: 173 10*3/uL (ref 150–400)
RBC: 3.91 MIL/uL — ABNORMAL LOW (ref 4.22–5.81)
WBC: 10.3 10*3/uL (ref 4.0–10.5)

## 2012-08-19 LAB — GLUCOSE, CAPILLARY: Glucose-Capillary: 147 mg/dL — ABNORMAL HIGH (ref 70–99)

## 2012-08-19 LAB — HEPARIN LEVEL (UNFRACTIONATED): Heparin Unfractionated: 0.39 IU/mL (ref 0.30–0.70)

## 2012-08-19 MED ORDER — ALBUTEROL SULFATE (5 MG/ML) 0.5% IN NEBU
2.5000 mg | INHALATION_SOLUTION | RESPIRATORY_TRACT | Status: DC | PRN
  Administered 2012-08-19 – 2012-08-20 (×2): 2.5 mg via RESPIRATORY_TRACT
  Filled 2012-08-19 (×2): qty 0.5

## 2012-08-19 MED ORDER — MORPHINE SULFATE 4 MG/ML IJ SOLN
4.0000 mg | INTRAMUSCULAR | Status: DC | PRN
  Administered 2012-08-19 – 2012-08-22 (×5): 4 mg via INTRAVENOUS
  Filled 2012-08-19 (×5): qty 1

## 2012-08-19 MED ORDER — DEXTROSE 5 % IV SOLN
1.0000 g | INTRAVENOUS | Status: DC
  Administered 2012-08-19 – 2012-08-22 (×4): 1 g via INTRAVENOUS
  Filled 2012-08-19 (×4): qty 10

## 2012-08-19 MED ORDER — IPRATROPIUM BROMIDE 0.02 % IN SOLN
0.5000 mg | RESPIRATORY_TRACT | Status: DC | PRN
  Administered 2012-08-19 – 2012-08-20 (×2): 0.5 mg via RESPIRATORY_TRACT
  Filled 2012-08-19 (×2): qty 2.5

## 2012-08-19 MED ORDER — FUROSEMIDE 10 MG/ML IJ SOLN
40.0000 mg | Freq: Once | INTRAMUSCULAR | Status: AC
  Administered 2012-08-19: 40 mg via INTRAVENOUS
  Filled 2012-08-19: qty 4

## 2012-08-19 MED ORDER — HYDROCHLOROTHIAZIDE 25 MG PO TABS
25.0000 mg | ORAL_TABLET | Freq: Every day | ORAL | Status: DC
  Administered 2012-08-19 – 2012-08-21 (×3): 25 mg via ORAL
  Filled 2012-08-19 (×4): qty 1

## 2012-08-19 MED ORDER — SPIRONOLACTONE-HCTZ 25-25 MG PO TABS
1.0000 | ORAL_TABLET | Freq: Every day | ORAL | Status: DC
  Filled 2012-08-19: qty 1

## 2012-08-19 MED ORDER — ENOXAPARIN SODIUM 40 MG/0.4ML ~~LOC~~ SOLN
40.0000 mg | SUBCUTANEOUS | Status: DC
  Administered 2012-08-19 – 2012-08-22 (×4): 40 mg via SUBCUTANEOUS
  Filled 2012-08-19 (×4): qty 0.4

## 2012-08-19 MED ORDER — SPIRONOLACTONE 25 MG PO TABS
25.0000 mg | ORAL_TABLET | Freq: Every day | ORAL | Status: DC
  Administered 2012-08-19 – 2012-08-21 (×3): 25 mg via ORAL
  Filled 2012-08-19 (×4): qty 1

## 2012-08-19 NOTE — Progress Notes (Signed)
Subjective:  Says breathing is better today. Not SOB.  Denies chest pain.  Still in a lot of pain with his leg.  Objective:  Vital Signs in the last 24 hours: BP 163/89  Pulse 97  Temp 97 F (36.1 C) (Oral)  Resp 21  Ht 6\' 1"  (1.854 m)  Wt 89.812 kg (198 lb)  BMI 26.12 kg/m2  SpO2 93%  Physical Exam: Obese BM in NAD but c/o pain in right leg Lungs: reduced breath sounds Cardiac:  Regular rhythm, normal S1 and S2, no S3 Abdomen:  Soft, nontender, no masses Extremities:  1+ edema present  Intake/Output from previous day: 12/02 0701 - 12/03 0700 In: 179.8 [I.V.:179.8] Out: 1750 [Urine:1750] Weight Filed Weights   08/17/12 0900  Weight: 89.812 kg (198 lb)    Lab Results: Basic Metabolic Panel:  Basename 08/19/12 0240 08/18/12 0145  NA 133* 138  K 3.8 3.4*  CL 96 100  CO2 28 29  GLUCOSE 163* 157*  BUN 41* 36*  CREATININE 1.56* 1.36*    CBC:  Basename 08/19/12 0240 08/18/12 0145 08/16/12 2029  WBC 10.3 10.4 --  NEUTROABS -- -- 4.9  HGB 10.0* 10.3* --  HCT 32.5* 34.3* --  MCV 83.1 83.1 --  PLT 173 197 --    BNP    Component Value Date/Time   PROBNP 3818.0* 08/17/2012 0900    PROTIME: Lab Results  Component Value Date   INR 1.51* 08/16/2012   INR 1.01 10/19/2010   INR 2.21* 04/04/2010    Telemetry: Sinus rhythm  Radiology:  CTA of chest no evidence of pulmonary embolus  Assessment/Plan:  1.  Dyspnea is likely multifactorial -  He has severe pulmonary hypertension and suspect that he has significant COPD and poss cor pulmonale.  He could well have sleep apnea as significant snoring as well as obesity. He could have an element of diastolic dysfunction due to hypertension, but I think this is not the primary problem but may be a secondary part 2. Hypertensive heart disease with normal systolic function 3. Worsening renal failure -  May be due to diuresis.  Spoke last night with pulmonary about possibility of PE and felt important to get CTA to  evaluate in light of pulmonary hypertension.  For now stop HCTZ and hold ARB. 4. Peripheral vascular disease  Rec:  Ask for pulmonary consult -  The prior CT scan suggests emphysema and it is possible that what is being called CHF is interstitial lung disease.  Stop smoking Consider sleep study.  Be careful with diuresis. Watch renal function carefully with recent contrast Can stop IV heparin.  Darden Palmer  MD [facility] Cardiology  08/19/2012, 8:31 AM

## 2012-08-19 NOTE — Clinical Social Work Placement (Signed)
Clinical Social Work Department CLINICAL SOCIAL WORK PLACEMENT NOTE 08/19/2012  Patient:  Terry [name], Terry [name]  Account Number:  [id number] Admit date:  08/16/2012  Clinical Social Worker:  Jodelle Red  Date/time:  08/19/2012 10:51 AM  Clinical Social Work is seeking post-discharge placement for this patient at the following level of care:   SKILLED NURSING   (*CSW will update this form in Epic as items are completed)   08/19/2012  Patient/family provided with [facility] Department of Clinical Social Work's list of facilities offering this level of care within the geographic area requested by the patient (or if unable, by the patient's family).  08/19/2012  Patient/family informed of their freedom to choose among providers that offer the needed level of care, that participate in Medicare, Medicaid or managed care program needed by the patient, have an available bed and are willing to accept the patient.  08/19/2012  Patient/family informed of [facility]' ownership interest in [facility], as well as of the fact that they are under no obligation to receive care at this facility.  PASARR submitted to EDS on  PASARR number received from EDS on   FL2 transmitted to all facilities in geographic area requested by pt/family on   FL2 transmitted to all facilities within larger geographic area on   Patient informed that his/her managed care company has contracts with or will negotiate with  certain facilities, including the following:     Patient/family informed of bed offers received:   Patient chooses bed at  Physician recommends and patient chooses bed at    Patient to be transferred to  on   Patient to be transferred to facility by   The following physician request were entered in Epic:   Additional Comments:  Doreen Salvage, LCSW ICU/Stepdown Clinical Social Worker [facility] Cell [phone] Hours 8am-1200pm M-F

## 2012-08-19 NOTE — Consult Note (Signed)
PULMONARY  / CRITICAL CARE MEDICINE  Name: Terry [name] MRN: [id number] DOB: 1945/10/16    LOS: 3  REFERRING PROVIDER:  Elisabeth Pigeon  CHIEF COMPLAINT:  PAH  BRIEF PATIENT DESCRIPTION:  66 year old male admitted on 11/30 for right tib/fib fracture. Had acute on what was determined to be chronically progressive dyspnea. ECHO obtained during evaluation showed PAS in 70s which was the reason for pulmonary consultation.   SIGNIFICANT EVENTS:  CT chest 12/2: negative for PE. Small right effusion, some interstitial prominence   LINES / TUBES:  CULTURES:  ANTIBIOTICS:   LEVEL OF CARE:  SDU PRIMARY SERVICE:  Nitka CONSULTANTS:  Pulm and IM CODE STATUS:  Full  DIET:  reg DVT Px:  LMWH  GI Px:  None   HISTORY OF PRESENT ILLNESS:   This 66 year old black male has a prior history of severe hypertension, peripheral vascular disease and had a left iliac stent placed in 2009. He has a had a 4-6 month history slowly progressive dyspnea to the point he could walk only about 10 ft before onset of SOB. Has had 1 mo. H/o LE edema. He has continued to smoke cigarettes and smokes about a pack-a-day and has at least a 50 year pack history. Denies chronic cough, wheeze, or chest pain. He fell and had a right  tibial fracture that was repaired on 12/1. Post op has had oxygen desaturations. As well as some confusion.He has not had any chest discomfort suggestive of angina. He had mild elevations of troponin that were trivial and had sinus tachycardia with nonspecific changes on EKG. He has never had angina.Because of this he had cardiology consultation. On echo they noted the LV to be intact, and what was severe PAH for which we were consulted.    PAST MEDICAL HISTORY :  Past Medical History  Diagnosis Date  . Diabetes mellitus without complication   . Peripheral vascular disease   . Hypertensive heart disease   . Obesity (BMI 30-39.9)   . Lumbar disc disease    Past Surgical History  Procedure Date  .  Back surgery     numerous spine surgeries, 1966-2011  . Hip replacement     bilateral, each replaced twice  . Tonsillectomy   . Tibia im nail insertion 08/17/2012    Procedure: INTRAMEDULLARY (IM) NAIL TIBIAL;  Surgeon: Kerrin Champagne, MD;  Location: [facility];  Service: Orthopedics;  Laterality: Right;   Prior to Admission medications   Medication Sig Start Date End Date Taking? Authorizing Provider  losartan-hydrochlorothiazide (HYZAAR) 100-25 MG per tablet Take 1 tablet by mouth daily.   Yes Historical Provider, MD  METFORMIN HCL PO Take 1 tablet by mouth 3 (three) times daily.   Yes Historical Provider, MD  metoprolol (LOPRESSOR) 50 MG tablet Take 50 mg by mouth 2 (two) times daily.   Yes Historical Provider, MD  oxycodone (OXY-IR) 5 MG capsule Take 20 mg by mouth 3 (three) times daily as needed. For pain   Yes Historical Provider, MD  oxyCODONE (OXYCONTIN) 40 MG 12 hr tablet Take 40 mg by mouth every 12 (twelve) hours.   Yes Historical Provider, MD   No Known Allergies  FAMILY HISTORY:  History reviewed. No pertinent family history. SOCIAL HISTORY:  reports that he has been smoking Cigarettes.  He has a 50 pack-year smoking history. He has never used smokeless tobacco. He reports that he does not drink alcohol or use illicit drugs.  REVIEW OF SYSTEMS:   Constitutional: Negative for  fever, chills, + weight gain, + malaise/fatigue and - diaphoresis.  HENT: Negative for hearing loss, ear pain, nosebleeds, congestion, sore throat, neck pain, tinnitus and ear discharge.   Eyes: Negative for blurred vision, double vision, photophobia, pain, discharge and redness.  Respiratory: Negative for cough, hemoptysis, sputum production, + shortness of breath, progressive over the last month, - wheezing and - stridor. Poor sleep quality, possible snoring  Cardiovascular: Negative for chest pain, palpitations, orthopnea, claudication, + leg swelling and PND.  Gastrointestinal: Negative for heartburn,  nausea, vomiting, abdominal pain, diarrhea, constipation, blood in stool and melena.  Genitourinary: Negative for dysuria, urgency, frequency, hematuria and flank pain.  Musculoskeletal: Negative for myalgias, back pain, joint pain and falls. + post-op leg pain Skin: Negative for itching and rash.  Neurological: Negative for dizziness, tingling, tremors, sensory change, speech change, focal weakness, seizures, loss of consciousness, weakness and headaches.  Endo/Heme/Allergies: Negative for environmental allergies and polydipsia. Does not bruise/bleed easily.   VITAL SIGNS: Temp:  [97 F (36.1 C)-99.2 F (37.3 C)] 97 F (36.1 C) (12/03 0800) Pulse Rate:  [59-97] 59  (12/03 1000) Resp:  [16-22] 21  (12/03 1000) BP: (106-165)/(64-89) 165/86 mmHg (12/03 1000) SpO2:  [62 %-100 %] 62 % (12/03 1000)  PHYSICAL EXAMINATION: General:  Awake, alert, cooperative and appropriate Neuro:  No focal def.  HEENT:  [place], no JVD Cardiovascular:  rrr Lungs:  Decreased in bases.  Abdomen:  Firm, distended + bowel sounds  Musculoskeletal:  Intact  Skin:  + lower extremity swelling. Left swollen, 4+ edema. RLE dressed. CMS intact    Lab 08/19/12 0240 08/18/12 0145 08/17/12 0905  NA 133* 138 140  K 3.8 3.4* 3.7  CL 96 100 102  CO2 28 29 25   BUN 41* 36* 41*  CREATININE 1.56* 1.36* 1.25  GLUCOSE 163* 157* 112*    Lab 08/19/12 0240 08/18/12 0145 08/16/12 2029  HGB 10.0* 10.3* 10.7*  HCT 32.5* 34.3* 35.4*  WBC 10.3 10.4 8.2  PLT 173 197 217   Ct Angio Chest Pe W/cm &/or Wo Cm  08/18/2012  *RADIOLOGY REPORT*  Clinical Data: Pulmonary arterial hypertension of unknown etiology. Evaluate for pulmonary embolus.  CT ANGIOGRAPHY CHEST  Technique:  Multidetector CT imaging of the chest using the standard protocol during bolus administration of intravenous contrast. Multiplanar reconstructed images including MIPs were obtained and reviewed to evaluate the vascular anatomy.  Contrast: OMNIPAQUE IOHEXOL  350 MG/ML SOLN  Comparison: 10/18/2010  Findings: Satisfactory opacification of the pulmonary arteries noted, and there is no evidence of pulmonary emboli.  No evidence of thoracic aortic aneurysm or dissection.  Mild to moderate cardiomegaly is noted.  Diffuse interstitial infiltrates are also seen bilaterally, most likely due to interstitial edema.  Small right pleural effusion and right lower lobe atelectasis also seen, and consistent with congestive heart failure.  Shotty bilateral hilar and mediastinal lymph nodes are seen measuring 1-2 cm in short axis.  These are nonspecific and most likely due to lymphedema or reactive in etiology.  No central endobronchial lesion identified.  IMPRESSION:  1.  No evidence of pulmonary embolism. 2.  Congestive heart failure with small right pleural effusion. 3.  Shotty mediastinal and bilateral hilar lymph nodes, most likely secondary to lymphedema or reactive in etiology.   Original Report Authenticated By: Myles Rosenthal, M.D.     ASSESSMENT / PLAN: 1)  COPD: still actively smoking up til this admit.  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 CT negative for PE.  3)  pulmonary edema in setting of decompensated diastolic dysfxn 4)  probable sleep apnea 5)  acute on chronic respiratory failure: due to # 1,2, and prob #3.  6)  anemia  7)  acute renal failure 8)  DM 9)  Severe lumbar disc disease with chronic pain syndrome and multiple previous surgeries 10) s/p right tib/fib frx  This is almost certainly secondary PAH due to untreated COPD, possible OSA and on-going smoking, now w/ decompensated cor pulmonale, diastolic heart failure, some degree of pulmonary edema.  Recommendation:  -Empiric bronchodilator therapy - Sipriva -home O2 -out-pt PFTs -out-pt PSG -prophylaxis anticoagulation, but therapeutic not needed.  -diurese as BP and Creatinine tolerate   We will help arrange pulmonary follow up after discharge  Merwyn Katos Pulmonary and Critical Care Medicine [facility] Pager: [phone]  08/19/2012, 11:46 AM

## 2012-08-19 NOTE — Progress Notes (Signed)
Bilateral:  No evidence of DVT, superficial thrombosis, or Baker's Cyst in the vessels visualized.  Unable to evaluate calf vessels due to cast.

## 2012-08-19 NOTE — Progress Notes (Signed)
Physical Therapy Treatment Patient Details Name: Terry [name] MRN: [id number] DOB: 09/27/1945 Today's Date: 08/19/2012 Time: 1207-1237 PT Time Calculation (min): 30 min  PT Assessment / Plan / Recommendation Comments on Treatment Session  Pt progressing somewhat with bed mobility and standing, however transferring to chair was extremely difficult for pt and continues to require +2 assist for all mobility.    Follow Up Recommendations  Supervision/Assistance - 24 hour;SNF     Does the patient have the potential to tolerate intense rehabilitation     Barriers to Discharge        Equipment Recommendations  Rolling walker with 5" wheels;3 in 1 bedside comode    Recommendations for Other Services    Frequency Min 3X/week   Plan Discharge plan remains appropriate    Precautions / Restrictions Precautions Precautions: Fall Restrictions Weight Bearing Restrictions: Yes RLE Weight Bearing: Non weight bearing Other Position/Activity Restrictions: No order for NWB, will assume NWB until notified otherwise.    Pertinent Vitals/Pain 8/10 pain, pt premedicated    Mobility  Bed Mobility Bed Mobility: Supine to Sit Supine to Sit: 4: Min assist;HOB elevated Details for Bed Mobility Assistance: Pt doing better with getting to EOB today and only requires assist for RLE.  Mod cues for hand placement on bed to self assist.  Transfers Transfers: Sit to Stand;Stand to Sit;Stand Pivot Transfers Sit to Stand: 1: +2 Total assist;From elevated surface;With upper extremity assist;From bed Sit to Stand: Patient Percentage: 50% Stand to Sit: 1: +2 Total assist;With upper extremity assist;To elevated surface;To bed Stand to Sit: Patient Percentage: 30% Stand Pivot Transfers: 1: +2 Total assist Stand Pivot Transfers: Patient Percentage: 20% Details for Transfer Assistance: Pt with improved standing during todays session with cues for hand placement and LE management when sitting/standing.  Pt able to  somewhat pivot to chair, however was very difficult due to pain and forward flexed posture.   Ambulation/Gait Ambulation/Gait Assistance: Not tested (comment)    Exercises     PT Diagnosis:    PT Problem List:   PT Treatment Interventions:     PT Goals Acute Rehab PT Goals PT Goal Formulation: With patient/family Time For Goal Achievement: 09/01/12 Potential to Achieve Goals: Fair Pt will go Supine/Side to Sit: with supervision PT Goal: Supine/Side to Sit - Progress: Progressing toward goal Pt will go Sit to Stand: with mod assist PT Goal: Sit to Stand - Progress: Progressing toward goal Pt will go Stand to Sit: with mod assist PT Goal: Stand to Sit - Progress: Progressing toward goal Pt will Transfer Bed to Chair/Chair to Bed: with max assist PT Transfer Goal: Bed to Chair/Chair to Bed - Progress: Progressing toward goal  Visit Information  Last PT Received On: 08/19/12 Assistance Needed: +2    Subjective Data  Subjective: I need to sit down Patient Stated Goal: to get back home   Cognition  Overall Cognitive Status: Appears within functional limits for tasks assessed/performed Arousal/Alertness: Awake/alert Orientation Level: Appears intact for tasks assessed Behavior During Session: Terry [name], Terry [name] for tasks performed    Balance     End of Session PT - End of Session Equipment Utilized During Treatment: Gait belt;Other (comment) Activity Tolerance: Patient limited by pain Patient left: in chair;with call bell/phone within reach;with nursing in room Nurse Communication: Mobility status;Need for lift equipment   GP     Terry [name] 08/19/2012, 1:41 PM

## 2012-08-19 NOTE — Consult Note (Addendum)
[facility] PROGRESS NOTE  Terry [name] ZOX:[id number] DOB: 1945-09-23 DOA: 08/16/2012 PCP: No primary provider on file.  Requesting physician: Dr. Otelia Sergeant (Orthopedics)  Date of consultation: 08/17/2012  Reason for consultation: confusion and low oxygen saturation   HPI:  66 year old male with history of HTN and DM who presented 08/16/2012 status post fall in his office, has sustained right tibial distal fracture, oblique spiral tibia and proximal nondisplaced fibula neck fracture. Patient is now status post ORIF of distal tibia fracture (POD #3). Orthopedics requested internal medicine consult as patient had low O2 saturation in PACU and confusion. We have ruled out pulmonary embolism with negative CT Angio chest.  In addition, CXR done 08/17/2012 showed worsening interstitial edema and congestive heart failure. The obvious concern was development of NSTEMI considering elevated troponins (range 0.55 - .043). Cardiology was consulted for input on management.  Impression/Recommendations:   Principal Problem:  *Fracture of tibia, distal, right, closed  Status post ORIF of distal tibia fracture (POD #3)  Management per ortho team Pain seems to be adequately controlled with the following pain medication regimen: MS Contin 40 mg twice daily scheduled by mouth, oxycodone 5 mg every 3 hours when necessary by mouth for moderate pain and morphine 4 mg IV every 3 hours as needed for severe pain Continue Robaxin every 6 hours IV as needed for muscle spasms  Active Problems:  Acute hypoxic respiratory failure  Obvious concern was for pulmonary embolism. We have attempted VQ scan (due to acute kidney injury) initially but due to multiple back surgeries patient could not lay down back flat and has refused this study. Cardiology started the patient on heparin drip for possible pulmonary embolism and subsequently  CT angio chest was done 08/18/2012 with findings negative for pulmonary embolism. Heparin  drip now discontinued. Blood gas was done 08/17/2012 showed respiratory acidosis. We have started BiPAP as needed. Respiratory status is now stable and patient saturating above 90% with nasal cannula We have given one dose of Lasix 08/17/2012 for congestive heart failure but this has been now discontinued due to worsening renal function IV fluids only KVO  Acute diastolic congestive heart failure   Appreciate cardiology consult   2-D echo done 08/17/2012 showed ejection fraction 65%   BNP elevated at 3818 and the patient was initially on Lasix 40 mg IV daily 08/17/2012 and 08/18/2012. This is now discontinued secondary to worsening renal function  Per cardiology recommendations we will continue metoprolol 100 mg twice daily  Cardiac enzymes trended down from 0.55 to 0.43 Confusion  Mental status seems to be at baseline. Patient is alert, awake and oriented to time, place, person Diabetes mellitus type 2 with peripheral artery disease  A1c this admission 7.6, above the goal for good glucose control  Continue sliding scale insulin  Continue insulin 2 units 3 times a day with meals CBGs in last 24 hours are as follows: 152, 149, 132 Hypertension  Continue metoprolol 100 mg twice daily  Anemia  Likely of chronic disease  No signs of active bleed  Hemoglobin stable in the range 10-10.3 Continue to monitor CBC Hypokalemia  Secondary to Lasix  Potassium repleted 08/18/2012  Potassium within normal limits today Acute kidney injury  Secondary to combination of Lasix as well as contrast-induced nephropathy  Would be cautious with IV fluids secondary to diastolic CHF Encourage oral intake Continue to monitor kidney function Coagulopathy  Unclear etiology  No signs of active bleed  INR 08/17/1999 and ferritin is 1.51 Repeat  INR today Urinary retention and UTI  Secondary to urethral strictures  Urology consult requested and Foley was inserted with good urine output  Continue  rocephin and follow up urine culture results  Disposition: The patient is stable for transfer to telemetry floor. PT evaluation order in place. Social work consulted for possible short-term rehabilitation  [facility] will followup again tomorrow. Please contact me if I can be of assistance in the meanwhile. Thank you for this consultation.   Manson Passey  [facility]  Pager [phone] or [phone]  Other consultants:  Cardiology (Dr. Sondra Come)  Urology (Dr. Berneice Heinrich)  If 7PM-7AM, please contact night-coverage [email or web address] Password [email or web address] 08/19/2012, 7:15 AM   LOS: 3 days   HPI/Subjective: No acute overnight events.  Objective: Filed Vitals:   08/18/12 2200 08/19/12 0000 08/19/12 0200 08/19/12 0400  BP: 157/89 152/80 134/86 161/81  Pulse: 79 80 71 77  Temp:  98.1 F (36.7 C)  97.9 F (36.6 C)  TempSrc:  Oral  Oral  Resp: 20 17 19 18   Height:      Weight:      SpO2: 99% 94% 100% 100%    Intake/Output Summary (Last 24 hours) at 08/19/12 0715 Last data filed at 08/19/12 0500  Gross per 24 hour  Intake 149.75 ml  Output   1750 ml  Net -1600.25 ml    Exam:   General:  Pt is alert, follows commands appropriately, not in acute distress  Cardiovascular: Regular rate and rhythm, S1/S2 appreciated  Respiratory: Wheezing appreciated over upper lung lobes, no rhonchi or rales  Abdomen: Soft, non tender, non distended, bowel sounds present, no guarding  Extremities: No edema, pulses DP and PT palpable bilaterally  Neuro: Grossly nonfocal  Data Reviewed: Basic Metabolic Panel:  Lab 08/19/12 4132 08/18/12 0145 08/17/12 0905 08/16/12 2029  NA 133* 138 140 139  K 3.8 3.4* 3.7 3.6  CL 96 100 102 101  CO2 28 29 25 26   GLUCOSE 163* 157* 112* 81  BUN 41* 36* 41* 50*  CREATININE 1.56* 1.36* 1.25 1.48*  CALCIUM 8.9 8.7 8.6 8.8   Liver Function Tests:  Lab 08/19/12 0240 08/18/12 0145 08/17/12 0905 08/16/12 2029  AST 26 33 49* 57*  ALT 10 25 45 54*  ALKPHOS 88 97 103  118*  BILITOT 0.4 0.5 0.5 0.6  PROT 7.1 7.0 7.5 8.0  ALBUMIN 2.5* 2.7* 2.9* 3.2*   CBC:  Lab 08/19/12 0240 08/18/12 0145 08/16/12 2029  WBC 10.3 10.4 8.2  HGB 10.0* 10.3* 10.7*  HCT 32.5* 34.3* 35.4*  MCV 83.1 83.1 82.5  PLT 173 197 217   Cardiac Enzymes:  Lab 08/18/12 0145 08/17/12 1921 08/17/12 1324 08/17/12 0900  CKTOTAL -- -- -- --  CKMB -- -- -- --  TROPONINI 0.43* 0.40* 0.48* 0.55*   CBG:  Lab 08/18/12 2123 08/18/12 1630 08/18/12 1120 08/18/12 0853 08/17/12 2141  GLUCAP 152* 149* 132* 172* 133*    URINE CULTURE     Status: Normal   Collection Time   08/16/12  9:48 PM      Component Value Range Status Comment   Specimen Description URINE  Final    Special Requests NONE   Final    Culture  Setup Time 08/17/2012 04:48   Final    Colony Count >=100,000 COLONIES/ML   Final    Culture VIRIDANS STREPTOCOCCUS   Final    Report Status 08/18/2012 FINAL   Final   MRSA PCR SCREENING     Status: Normal  Collection Time   08/17/12  9:08 AM      Component Value Range Status Comment   MRSA by PCR NEGATIVE  NEGATIVE Final      Studies: Ct Angio Chest Pe W/cm &/or Wo Cm 08/18/2012  * IMPRESSION:  1.  No evidence of pulmonary embolism. 2.  Congestive heart failure with small right pleural effusion. 3.  Shotty mediastinal and bilateral hilar lymph nodes, most likely secondary to lymphedema or reactive in etiology.       Scheduled Meds:  . docusate sodium  100 mg Oral BID  . insulin aspart  0-15 Units Subcutaneous TID WC  . metoprolol  100 mg Oral BID  . OxyCODONE  40 mg Oral Q12H

## 2012-08-19 NOTE — Progress Notes (Signed)
Subjective: 2 Days Post-Op Procedure(s) (LRB): INTRAMEDULLARY (IM) NAIL TIBIAL (Right) Awake, alert and O x 4. When can I go home. Decreased SOB. Card Dr. Donnie Aho noted likely pulm hypertension secondary to COPD and multiple factors CTA apparently negative for PE. Patient reports pain as marked.    Objective:   VITALS:  Temp:  [97 F (36.1 C)-99.2 F (37.3 C)] 97 F (36.1 C) (12/03 0800) Pulse Rate:  [71-97] 97  (12/03 0800) Resp:  [16-22] 21  (12/03 0800) BP: (106-163)/(64-89) 163/89 mmHg (12/03 0800) SpO2:  [80 %-100 %] 93 % (12/03 0800)  Neurologically intact ABD soft Neurovascular intact Sensation intact distally Intact pulses distally Dorsiflexion/Plantar flexion intact Incision: no drainage No cellulitis present Compartment soft   LABS  Basename 08/19/12 0240 08/18/12 0145 08/16/12 2029  HGB 10.0* 10.3* 10.7*  WBC 10.3 10.4 --  PLT 173 197 --    Basename 08/19/12 0240 08/18/12 0145  NA 133* 138  K 3.8 3.4*  CL 96 100  CO2 28 29  BUN 41* 36*  CREATININE 1.56* 1.36*  GLUCOSE 163* 157*    Basename 08/16/12 2029  LABPT --  INR 1.51*     Assessment/Plan: 2 Days Post-Op Procedure(s) (LRB): INTRAMEDULLARY (IM) NAIL TIBIAL (Right) Urinary retention due to distal urethra stricture and phimosis, indwelling foley to remain 3 more days at least per Urol. Pulmonary decompensation, CHF, COPD.improving .  Advance diet Up with therapy Discharge to SNF as he has significant preexisting lower extremity weakness and poor coordination. I don't think he will do well without SNF due to significant dependencies, indwelling foley to be kept for at least 3 more days. He has his wife but he is 6'1" and weight >200 lbs and his good leg is weak on a chronic basis. Will rely on Dr. Armando Gang assistance in deciding when we can transfer to regular floor and when he may be stable To be sent to SNF.  Keymani Glynn E 08/19/2012, 10:39 AM

## 2012-08-19 NOTE — Progress Notes (Signed)
ANTICOAGULATION CONSULT NOTE - Follow Up Consult  Pharmacy Consult for heparin Indication: pulmonary embolus  No Known Allergies  Patient Measurements: Height: 6\' 1"  (185.4 cm) Weight: 198 lb (89.812 kg) (Patient has trapeze equipment.  Weight stated by patient.) IBW/kg (Calculated) : 79.9  Heparin Dosing Weight:   Vital Signs: Temp: 97.9 F (36.6 C) (12/03 0400) Temp src: Oral (12/03 0400) BP: 161/81 mmHg (12/03 0400) Pulse Rate: 77  (12/03 0400)  Labs:  Basename 08/19/12 0240 08/19/12 0200 08/18/12 0145 08/17/12 1921 08/17/12 1324 08/17/12 0905 08/16/12 2029  HGB 10.0* -- 10.3* -- -- -- --  HCT 32.5* -- 34.3* -- -- -- 35.4*  PLT 173 -- 197 -- -- -- 217  APTT -- -- -- -- -- -- --  LABPROT -- -- -- -- -- -- 17.8*  INR -- -- -- -- -- -- 1.51*  HEPARINUNFRC -- 0.39 -- -- -- -- --  CREATININE 1.56* -- 1.36* -- -- 1.25 --  CKTOTAL -- -- -- -- -- -- --  CKMB -- -- -- -- -- -- --  TROPONINI -- -- 0.43* 0.40* 0.48* -- --    Estimated Creatinine Clearance: 51.9 ml/min (by C-G formula based on Cr of 1.56).   Medications:  Infusions:    . heparin 1,500 Units/hr (08/18/12 1901)    Assessment: Patient with PE.  Heparin level at goal. No issues with drip per RN.  Goal of Therapy:  Heparin level 0.3-0.7 units/ml Monitor platelets by anticoagulation protocol: Yes   Plan:  Continue with current drip rate, recheck level at 1000  Darlina Guys, Jacquenette Shone Crowford 08/19/2012,5:59 AM

## 2012-08-19 NOTE — Progress Notes (Signed)
Occupational Therapy Treatment Patient Details Name: Terry [name] MRN: [id number] DOB: 05-16-1946 Today's Date: 08/19/2012 Time: 6213-0865 OT Time Calculation (min): 26 min  OT Assessment / Plan / Recommendation Comments on Treatment Session Pt was able to complete transfer to chair today but not without significant max A from therapists. Highly recommend snf at d/c.    Follow Up Recommendations  SNF    Barriers to Discharge       Equipment Recommendations  Rolling walker with 5" wheels;3 in 1 bedside comode    Recommendations for Other Services    Frequency Min 2X/week   Plan Discharge plan remains appropriate    Precautions / Restrictions Precautions Precautions: Fall Restrictions Weight Bearing Restrictions: Yes RLE Weight Bearing: Non weight bearing Other Position/Activity Restrictions: No order for NWB, will assume NWB until notified otherwise.    Pertinent Vitals/Pain Pt reported 8/10 pain from R knee to ankle. RN premedicated and repositioned for comfort.    ADL  Toilet Transfer: Simulated;+2 Total assistance Toilet Transfer: Patient Percentage: 10% Toilet Transfer Method: Stand pivot Toilet Transfer Equipment: Other (comment) (to chair.) Equipment Used: Rolling walker;Gait belt ADL Comments: Pt was unable to fully complete transfer with RW. Therapist had to help shift hips around and over to chair. Pt initially stands with trunk fully flexed. Max cues for posture, technique and maintaining NWB status to RLE. Pt unable to let go of RW while standing in order to complete any standing ADL activity.    OT Diagnosis:    OT Problem List:   OT Treatment Interventions:     OT Goals ADL Goals ADL Goal: Toilet Transfer - Progress: Progressing toward goals  Visit Information  Last OT Received On: 08/19/12 Assistance Needed: +2 PT/OT Co-Evaluation/Treatment: Yes    Subjective Data  Subjective: Oh. I just don't think I can do this....   Prior Functioning        Cognition  Overall Cognitive Status: Appears within functional limits for tasks assessed/performed Arousal/Alertness: Awake/alert Orientation Level: Appears intact for tasks assessed Behavior During Session: [facility] for tasks performed    Mobility  Shoulder Instructions Bed Mobility Bed Mobility: Supine to Sit Supine to Sit: 4: Min assist;HOB elevated Details for Bed Mobility Assistance: Pt doing better with getting to EOB today and only requires assist for RLE.  Mod cues for hand placement on bed to self assist.  Transfers Sit to Stand: 1: +2 Total assist;From elevated surface;With upper extremity assist;From bed Sit to Stand: Patient Percentage: 50% Stand to Sit: 1: +2 Total assist;With upper extremity assist;To elevated surface;To bed Stand to Sit: Patient Percentage: 30% Details for Transfer Assistance: Pt with improved standing during todays session with cues for hand placement and LE management when sitting/standing.  Pt able to somewhat pivot to chair, however was very difficult due to pain and forward flexed posture.         Exercises      Balance     End of Session OT - End of Session Equipment Utilized During Treatment: Gait belt Activity Tolerance: Patient limited by fatigue;Patient limited by pain Patient left: in chair;with call bell/phone within reach Nurse Communication: Need for lift equipment  GO     Kare Dado A 08/19/2012, 2:42 PM

## 2012-08-20 DIAGNOSIS — D649 Anemia, unspecified: Secondary | ICD-10-CM

## 2012-08-20 DIAGNOSIS — E1169 Type 2 diabetes mellitus with other specified complication: Secondary | ICD-10-CM

## 2012-08-20 DIAGNOSIS — S82839A Other fracture of upper and lower end of unspecified fibula, initial encounter for closed fracture: Secondary | ICD-10-CM

## 2012-08-20 DIAGNOSIS — I509 Heart failure, unspecified: Secondary | ICD-10-CM

## 2012-08-20 DIAGNOSIS — I10 Essential (primary) hypertension: Secondary | ICD-10-CM

## 2012-08-20 DIAGNOSIS — I5032 Chronic diastolic (congestive) heart failure: Secondary | ICD-10-CM

## 2012-08-20 DIAGNOSIS — G8929 Other chronic pain: Secondary | ICD-10-CM

## 2012-08-20 LAB — CBC
HCT: 31.7 % — ABNORMAL LOW (ref 39.0–52.0)
Hemoglobin: 9.2 g/dL — ABNORMAL LOW (ref 13.0–17.0)
MCH: 24.1 pg — ABNORMAL LOW (ref 26.0–34.0)
MCV: 83 fL (ref 78.0–100.0)
RBC: 3.82 MIL/uL — ABNORMAL LOW (ref 4.22–5.81)

## 2012-08-20 LAB — BASIC METABOLIC PANEL
BUN: 40 mg/dL — ABNORMAL HIGH (ref 6–23)
CO2: 29 mEq/L (ref 19–32)
Calcium: 9 mg/dL (ref 8.4–10.5)
Creatinine, Ser: 1.61 mg/dL — ABNORMAL HIGH (ref 0.50–1.35)
Glucose, Bld: 123 mg/dL — ABNORMAL HIGH (ref 70–99)

## 2012-08-20 LAB — PROTIME-INR: INR: 1.15 (ref 0.00–1.49)

## 2012-08-20 LAB — GLUCOSE, CAPILLARY
Glucose-Capillary: 123 mg/dL — ABNORMAL HIGH (ref 70–99)
Glucose-Capillary: 207 mg/dL — ABNORMAL HIGH (ref 70–99)
Glucose-Capillary: 120 mg/dL — ABNORMAL HIGH (ref 70–99)

## 2012-08-20 NOTE — Progress Notes (Signed)
Physical Therapy Treatment Patient Details Name: Terry [name] MRN: [id number] DOB: 1946/05/29 Today's Date: 08/20/2012 Time: 4098-1191 PT Time Calculation (min): 12 min  PT Assessment / Plan / Recommendation Comments on Treatment Session  Pt. is giving effort to participate in spite of pain.     Follow Up Recommendations  SNF     Does the patient have the potential to tolerate intense rehabilitation     Barriers to Discharge        Equipment Recommendations  Rolling walker with 5" wheels    Recommendations for Other Services    Frequency Min 3X/week   Plan Discharge plan remains appropriate;Frequency remains appropriate    Precautions / Restrictions Precautions Precautions: Fall Precaution Comments: LLE is weak Restrictions RLE Weight Bearing: Non weight bearing   Pertinent Vitals/Pain 8/10 premedicated R leg    Mobility  Bed Mobility  Sit to Supine: 3: Mod assist Details for Bed Mobility Assistance: lifting RLE onto bed. Transfers Sit to Stand: 1: +2 Total assist;From chair/3-in-1;With armrests;With upper extremity assist Sit to Stand: Patient Percentage: 60% Stand to Sit: 1: +2 Total assist;To bed Stand to Sit: Patient Percentage: 60% Stand Pivot Transfers: 1: +2 Total assist Stand Pivot Transfers: Patient Percentage: 60% Details for Transfer Assistance: pt improved a little with pivoting from recliner to bed. Ambulation/Gait Ambulation/Gait Assistance: Not tested (comment) Gait Pattern: Step-to pattern;Trunk flexed    Exercises     PT Diagnosis:    PT Problem List:   PT Treatment Interventions:     PT Goals Acute Rehab PT Goals Pt will go Supine/Side to Sit: with supervision PT Goal: Supine/Side to Sit - Progress: Progressing toward goal Pt will go Sit to Supine/Side: with min assist PT Goal: Sit to Supine/Side - Progress: Progressing toward goal Pt will go Sit to Stand: with mod assist PT Goal: Sit to Stand - Progress: Progressing toward goal Pt will  go Stand to Sit: with mod assist PT Goal: Stand to Sit - Progress: Progressing toward goal Pt will Transfer Bed to Chair/Chair to Bed: with max assist PT Transfer Goal: Bed to Chair/Chair to Bed - Progress: Progressing toward goal  Visit Information  Last PT Received On: 08/20/12 Assistance Needed: +2    Subjective Data  Subjective: I have a throbbing HA, Can I wait?   Cognition  Overall Cognitive Status: Appears within functional limits for tasks assessed/performed Arousal/Alertness: Awake/alert    Insurance risk surveyor Sitting - Balance Support: Bilateral upper extremity supported Static Sitting - Level of Assistance: 5: Stand by assistance  End of Session PT - End of Session Equipment Utilized During Treatment: Gait belt Activity Tolerance: Patient limited by pain;Patient limited by fatigue Patient left: with call bell/phone within reach;with family/visitor present Nurse Communication: Mobility status   GP     Rada Hay 08/20/2012, 2:49 PM [phone]

## 2012-08-20 NOTE — Progress Notes (Signed)
Patient ID: Terry [name]  male  GEX:[id number]    DOB: 1945/10/03    DOA: 08/16/2012  PCP: No primary provider on file.  Assessment/Plan: Principal Problem:  *Fracture of tibia, distal, right, closed: per Orthopedics (primary)  Active Problems:  Diabetes mellitus type 2 with peripheral artery disease - Continue sliding scale insulin   Hypertension : - Continue beta blocker, HCTZ, spironolactone - Hopefully creatinine has plateaued, if continues to trend up tomorrow, hold off on HCTZ and Aldactone   Anemia: H/H borderline   Acute kidney injury:Secondary to combination of Lasix as well as contrast-induced nephropathy     Secondary pulmonary hypertension - Reviewed pulmonary note, will need nebs,O2, spiriva out-patient. Out-pt PFT's, sleep study   COPD (chronic obstructive pulmonary disease): still smoking - counseled patient on smoking cessation   Diastolic CHF, chronic likely secondary to decompensated cor pulmonale - Cardiology recommendations reviewed  Urinary retention with urethral stricture: s/p bedside cysto and balloon dilation, voiding trial on Friday  Disposition: per ortho. Patient is now medically stable, voiding trial on Friday and per Dr Berneice Heinrich, DC foley on Friday.    Subjective: Denies any specific complaints but states "I have seen better days"  Objective: Weight change:   Intake/Output Summary (Last 24 hours) at 08/20/12 1342 Last data filed at 08/20/12 0508  Gross per 24 hour  Intake      0 ml  Output   1725 ml  Net  -1725 ml   Blood pressure 160/74, pulse 91, temperature 97.8 F (36.6 C), temperature source Oral, resp. rate 18, height 6\' 1"  (1.854 m), weight 89.812 kg (198 lb), SpO2 99.00%.  Physical Exam: General: Alert and awake, oriented x3, not in any acute distress. HEENT: anicteric sclera, pupils reactive to light and accommodation, EOMI CVS: S1-S2 clear, no murmur rubs or gallops Chest: mild scattered wheezing Abdomen: soft nontender,  nondistended, normal bowel sounds, no organomegaly Extremities: no cyanosis, clubbing. Right leg dressing intact  Neuro: non focal  Lab Results: Basic Metabolic Panel:  Lab 08/20/12 0102 08/19/12 0240 08/18/12 0145  NA 134* 133* --  K 4.0 3.8 --  CL 95* 96 --  CO2 29 28 --  GLUCOSE 123* 163* --  BUN 40* 41* --  CREATININE 1.61* 1.56* --  CALCIUM 9.0 8.9 --  MG -- -- 2.1  PHOS -- -- --   Liver Function Tests:  Lab 08/19/12 0240 08/18/12 0145  AST 26 33  ALT 10 25  ALKPHOS 88 97  BILITOT 0.4 0.5  PROT 7.1 7.0  ALBUMIN 2.5* 2.7*   No results found for this basename: LIPASE:2,AMYLASE:2 in the last 168 hours No results found for this basename: AMMONIA:2 in the last 168 hours CBC:  Lab 08/20/12 0510 08/19/12 0240 08/16/12 2029  WBC 9.0 10.3 --  NEUTROABS -- -- 4.9  HGB 9.2* 10.0* --  HCT 31.7* 32.5* --  MCV 83.0 83.1 --  PLT 177 173 --   Cardiac Enzymes:  Lab 08/18/12 0145 08/17/12 1921 08/17/12 1324  CKTOTAL -- -- --  CKMB -- -- --  CKMBINDEX -- -- --  TROPONINI 0.43* 0.40* 0.48*   BNP: No components found with this basename: POCBNP:2 CBG:  Lab 08/20/12 1211 08/20/12 0758 08/19/12 2146 08/19/12 1737 08/19/12 1206  GLUCAP 207* 123* 147* 189* 157*     Micro Results: Recent Results (from the past 240 hour(s))  URINE CULTURE     Status: Normal   Collection Time   08/16/12  9:48 PM  Component Value Range Status Comment   Specimen Description URINE, CLEAN CATCH   Final    Special Requests NONE   Final    Culture  Setup Time 08/17/2012 04:48   Final    Colony Count >=100,000 COLONIES/ML   Final    Culture VIRIDANS STREPTOCOCCUS   Final    Report Status 08/18/2012 FINAL   Final   MRSA PCR SCREENING     Status: Normal   Collection Time   08/17/12  9:08 AM      Component Value Range Status Comment   MRSA by PCR NEGATIVE  NEGATIVE Final     Studies/Results: Dg Chest 1 View  08/17/2012  *RADIOLOGY REPORT*  Clinical Data: Shortness of breath  postoperatively.  PORTABLE CHEST - 1 VIEW 08/17/2012 0656 hours:  Comparison: Portable chest x-ray yesterday.  Findings: Cardiac silhouette moderately enlarged but stable. Pulmonary venous hypertension and moderate interstitial pulmonary edema, slightly increased since the examination yesterday.  This is superimposed upon baseline chronic lung disease.  No confluent airspace consolidation.  Probable small right pleural effusion.  IMPRESSION: Slight worsening of interstitial pulmonary edema since yesterday, consistent with CHF and/or fluid overload.   Original Report Authenticated By: Hulan Saas, M.D.    Dg Tibia/fibula Right  08/17/2012  *RADIOLOGY REPORT*  Clinical Data: ORIF tib-fib fracture  RIGHT TIBIA AND FIBULA - 2 VIEW  Comparison: Preoperative radiographs 08/16/2012  Findings: Multiple intraoperative spot radiographs obtained during ORIF of distal tibia fracture with intramedullary rod with to proximal and two distal locking screws.  Alignment of the fracture fragments appears improved compared to the preoperative radiographs.  No evidence of immediate hardware complication.  IMPRESSION: ORIF of distal tibia fracture without evidence of immediate complication as above.   Original Report Authenticated By: Malachy Moan, M.D.    Dg Tibia/fibula Right  08/16/2012  *RADIOLOGY REPORT*  Clinical Data: Larey Seat out of a wheelchair and injured the right lower leg and ankle.  RIGHT TIBIA AND FIBULA - 2 VIEW  Comparison: Right ankle x-rays obtained concurrently.  Findings: Oblique comminuted fracture involving the distal tibial metaphysis.  Nondisplaced fractures extending more inferiorly in the metaphysis, though these do not extend to the articular surface.  No fractures elsewhere involving the tibia.  Nondisplaced fracture involving the proximal fibular metaphysis along its medial cortical surface.  Chondrocalcinosis in the medial meniscus of the knee.  Mild medial compartment joint space narrowing.   IMPRESSION:  1.  Oblique, comminuted fracture involving the distal tibial metaphysis with nondisplaced fractures more distally in the metaphysis that do not extend to the articular surface. 2.  Nondisplaced fracture involving the medial cortex of the proximal fibular metaphysis. 3.  CPPD arthropathy involving the knee.   Original Report Authenticated By: Hulan Saas, M.D.    Dg Ankle Complete Right  08/16/2012  *RADIOLOGY REPORT*  Clinical Data: Larey Seat out of a wheelchair and injured the right lower leg and ankle.  RIGHT ANKLE - COMPLETE 3+ VIEW  Comparison: Right tibia-fibula x-rays obtained concurrently.  Findings: Comminuted oblique fracture involving the distal tibial metaphysis, with a vertically oriented linear fracture extending more distally as well as a nondisplaced oblique fracture in the opposite direction more distally.  The fracture line does not appear to involve the articular surface.  No fractures about the ankle joint proper.  Ankle mortise intact.  Calcification in the soft tissues posterior to the ankle joint which are likely vascular.  IMPRESSION: Oblique, comminuted fracture involving the distal tibial metaphysis with nondisplaced fractures more distally in  the metaphysis that do not extend to the articular surface. No other fractures.   Original Report Authenticated By: Hulan Saas, M.D.    Ct Angio Chest Pe W/cm &/or Wo Cm  08/18/2012  *RADIOLOGY REPORT*  Clinical Data: Pulmonary arterial hypertension of unknown etiology. Evaluate for pulmonary embolus.  CT ANGIOGRAPHY CHEST  Technique:  Multidetector CT imaging of the chest using the standard protocol during bolus administration of intravenous contrast. Multiplanar reconstructed images including MIPs were obtained and reviewed to evaluate the vascular anatomy.  Contrast: OMNIPAQUE IOHEXOL 350 MG/ML SOLN  Comparison: 10/18/2010  Findings: Satisfactory opacification of the pulmonary arteries noted, and there is no evidence of  pulmonary emboli.  No evidence of thoracic aortic aneurysm or dissection.  Mild to moderate cardiomegaly is noted.  Diffuse interstitial infiltrates are also seen bilaterally, most likely due to interstitial edema.  Small right pleural effusion and right lower lobe atelectasis also seen, and consistent with congestive heart failure.  Shotty bilateral hilar and mediastinal lymph nodes are seen measuring 1-2 cm in short axis.  These are nonspecific and most likely due to lymphedema or reactive in etiology.  No central endobronchial lesion identified.  IMPRESSION:  1.  No evidence of pulmonary embolism. 2.  Congestive heart failure with small right pleural effusion. 3.  Shotty mediastinal and bilateral hilar lymph nodes, most likely secondary to lymphedema or reactive in etiology.   Original Report Authenticated By: Myles Rosenthal, M.D.    Dg Chest Port 1 View  08/16/2012  *RADIOLOGY REPORT*  Clinical Data: Preoperative respiratory exam for distal tibial fracture on the right.  PORTABLE CHEST - 1 VIEW  Comparison: 03/27/2010  Findings: Moderate interstitial pulmonary edema present superimposed on chronic lung disease.  Stable cardiomegaly.  No large pleural effusions.  IMPRESSION: Moderate interstitial edema and cardiomegaly.   Original Report Authenticated By: Irish Lack, M.D.    Dg C-arm 1-60 Min-no Report  08/17/2012  CLINICAL DATA: tib fib fracture   C-ARM 1-60 MINUTES  Fluoroscopy was utilized by the requesting physician.  No radiographic  interpretation.      Medications: Scheduled Meds:   . antiseptic oral rinse  15 mL Mouth Rinse BID  . cefTRIAXone (ROCEPHIN)  IV  1 g Intravenous Q24H  . docusate sodium  100 mg Oral BID  . enoxaparin (LOVENOX) injection  40 mg Subcutaneous Q24H  . [COMPLETED] furosemide  40 mg Intravenous Once  . hydrochlorothiazide  25 mg Oral Daily  . insulin aspart  0-15 Units Subcutaneous TID WC  . metoprolol  100 mg Oral BID  . OxyCODONE  40 mg Oral Q12H  .  spironolactone  25 mg Oral Daily      LOS: 4 days   Kristelle Cavallaro M.D. [facility] 08/20/2012, 1:42 PM Pager: [phone]  If 7PM-7AM, please contact night-coverage [email or web address] Password [email or web address]

## 2012-08-20 NOTE — Progress Notes (Signed)
3 Days Post-Op  Subjective: 1 - Urethral Stricture  - Day 3 s/p bedside cysto and balloon dilation of high-grade distal penile urethral stricture. No interval problems with foley. He is now working with PT and getting on his feet.  Objective: Vital signs in last 24 hours: Temp:  [97.8 F (36.6 C)-98.2 F (36.8 C)] 97.8 F (36.6 C) (12/04 0504) Pulse Rate:  [70-91] 91  (12/04 1005) Resp:  [12-18] 18  (12/04 0504) BP: (125-162)/(59-92) 160/74 mmHg (12/04 1005) SpO2:  [80 %-100 %] 99 % (12/04 0504) Last BM Date: 08/17/12  Intake/Output from previous day: 12/03 0701 - 12/04 0700 In: 410 [P.O.:360; IV Piggyback:50] Out: 2125 [Urine:2125] Intake/Output this shift:    General appearance: alert, cooperative, appears stated age and family in room Head: Normocephalic, without obvious abnormality, atraumatic Eyes: conjunctivae/corneas clear. PERRL, EOM's intact. Fundi benign. Resp: clear to auscultation bilaterally GI: soft, non-tender; bowel sounds normal; no masses,  no organomegaly Male genitalia: normal, foreskin down, no blood at meatus. Foley c/d/i with yellow urine Extremities: ortho appliance on   Lab Results:   Basename 08/20/12 0510 08/19/12 0240  WBC 9.0 10.3  HGB 9.2* 10.0*  HCT 31.7* 32.5*  PLT 177 173   BMET  Basename 08/20/12 0510 08/19/12 0240  NA 134* 133*  K 4.0 3.8  CL 95* 96  CO2 29 28  GLUCOSE 123* 163*  BUN 40* 41*  CREATININE 1.61* 1.56*  CALCIUM 9.0 8.9   PT/INR  Basename 08/20/12 0510 08/19/12 1000  LABPROT 14.5 14.7  INR 1.15 1.17   ABG No results found for this basename: PHART:2,PCO2:2,PO2:2,HCO3:2 in the last 72 hours  Studies/Results: Ct Angio Chest Pe W/cm &/or Wo Cm  08/18/2012  *RADIOLOGY REPORT*  Clinical Data: Pulmonary arterial hypertension of unknown etiology. Evaluate for pulmonary embolus.  CT ANGIOGRAPHY CHEST  Technique:  Multidetector CT imaging of the chest using the standard protocol during bolus administration of  intravenous contrast. Multiplanar reconstructed images including MIPs were obtained and reviewed to evaluate the vascular anatomy.  Contrast: OMNIPAQUE IOHEXOL 350 MG/ML SOLN  Comparison: 10/18/2010  Findings: Satisfactory opacification of the pulmonary arteries noted, and there is no evidence of pulmonary emboli.  No evidence of thoracic aortic aneurysm or dissection.  Mild to moderate cardiomegaly is noted.  Diffuse interstitial infiltrates are also seen bilaterally, most likely due to interstitial edema.  Small right pleural effusion and right lower lobe atelectasis also seen, and consistent with congestive heart failure.  Shotty bilateral hilar and mediastinal lymph nodes are seen measuring 1-2 cm in short axis.  These are nonspecific and most likely due to lymphedema or reactive in etiology.  No central endobronchial lesion identified.  IMPRESSION:  1.  No evidence of pulmonary embolism. 2.  Congestive heart failure with small right pleural effusion. 3.  Shotty mediastinal and bilateral hilar lymph nodes, most likely secondary to lymphedema or reactive in etiology.   Original Report Authenticated By: Myles Rosenthal, M.D.     Anti-infectives: Anti-infectives     Start     Dose/Rate Route Frequency Ordered Stop   08/19/12 1200   cefTRIAXone (ROCEPHIN) 1 g in dextrose 5 % 50 mL IVPB        1 g 100 mL/hr over 30 Minutes Intravenous Every 24 hours 08/19/12 1102     08/17/12 0901   ceFAZolin (ANCEF) 3 g in dextrose 5 % 50 mL IVPB        3 g 160 mL/hr over 30 Minutes Intravenous 60 min pre-op  08/17/12 0901 08/17/12 1119   08/17/12 0630   ceFAZolin (ANCEF) IVPB 2 g/50 mL premix        2 g 100 mL/hr over 30 Minutes Intravenous Every 6 hours 08/17/12 0322 08/17/12 1816          Assessment/Plan: 1 - Urethral Stricture  - Plan for DC Foley Friday AM for trial of void. I will arrange.  [facility] Terry [name] 08/20/2012

## 2012-08-20 NOTE — Progress Notes (Signed)
Subjective:  Pain with leg is better at present.  Not as SOB.  No chest pain.  Dr. Westley Hummer note read.  Objective:  Vital Signs in the last 24 hours: BP 160/74  Pulse 91  Temp 97.8 F (36.6 C) (Oral)  Resp 18  Ht 6\' 1"  (1.854 m)  Wt 89.812 kg (198 lb)  BMI 26.12 kg/m2  SpO2 99%  Physical Exam: Obese BM in NAD  In NAD Lungs: reduced breath sounds Cardiac:  Regular rhythm, normal S1 and S2, no S3 Abdomen:  Soft, nontender, no masses Extremities:  Trace edema present  Intake/Output from previous day: 12/03 0701 - 12/04 0700 In: 410 [P.O.:360; IV Piggyback:50] Out: 2125 [Urine:2125] Weight Filed Weights   08/17/12 0900  Weight: 89.812 kg (198 lb)    Lab Results: Basic Metabolic Panel:  Basename 08/20/12 0510 08/19/12 0240  NA 134* 133*  K 4.0 3.8  CL 95* 96  CO2 29 28  GLUCOSE 123* 163*  BUN 40* 41*  CREATININE 1.61* 1.56*    CBC:  Basename 08/20/12 0510 08/19/12 0240  WBC 9.0 10.3  NEUTROABS -- --  HGB 9.2* 10.0*  HCT 31.7* 32.5*  MCV 83.0 83.1  PLT 177 173    BNP    Component Value Date/Time   PROBNP 3818.0* 08/17/2012 0900   Telemetry: Sinus rhythm   Assessment/Plan:  1. Severe COPD with CO2 retention and cor pulmonale 2. Hypertensive heart disease with normal systolic function 3. Acute renal failure hopefully leveling off 4. Peripheral vascular disease  Rec:  I will need to see in 3 months once over rehab.  He will need careful pulmonary followup as outpatient and may need home O2 to avoid worsening of pulmonary hypertension if he is hypoxemic as an outpatient.  Darden Palmer  MD [facility]  08/20/2012, 11:09 AM

## 2012-08-20 NOTE — Progress Notes (Signed)
Subjective: 3 Days Post-Op Procedure(s) (LRB): INTRAMEDULLARY (IM) NAIL TIBIAL (Right) Patient reports pain as severe. Currently in severe pain as he has not had pain meds through the night.  Receiving them now from the nurse.  Out of bed with PT with max assist. O2 sats this am with O2 off was reported by RN as 70s  With 4 liters of O2 back to 90s.  Discussed the need for O2 with pt. And he understands.  Friend looking for bed at facility.   Objective: Vital signs in last 24 hours: Temp:  [97.8 F (36.6 C)-98.2 F (36.8 C)] 97.8 F (36.6 C) (12/04 0504) Pulse Rate:  [59-90] 73  (12/04 0504) Resp:  [12-21] 18  (12/04 0504) BP: (125-165)/(59-92) 162/92 mmHg (12/04 0504) SpO2:  [62 %-100 %] 99 % (12/04 0504)  Intake/Output from previous day: 12/03 0701 - 12/04 0700 In: 410 [P.O.:360; IV Piggyback:50] Out: 2125 [Urine:2125] Intake/Output this shift:     Basename 08/20/12 0510 08/19/12 0240 08/18/12 0145  HGB 9.2* 10.0* 10.3*    Basename 08/20/12 0510 08/19/12 0240  WBC 9.0 10.3  RBC 3.82* 3.91*  HCT 31.7* 32.5*  PLT 177 173    Basename 08/20/12 0510 08/19/12 0240  NA 134* 133*  K 4.0 3.8  CL 95* 96  CO2 29 28  BUN 40* 41*  CREATININE 1.61* 1.56*  GLUCOSE 123* 163*  CALCIUM 9.0 8.9    Basename 08/20/12 0510 08/19/12 1000  LABPT -- --  INR 1.15 1.17    Neurovascular intact Sensation intact distally Incision: splint dry and clean  Assessment/Plan: 3 Days Post-Op Procedure(s) (LRB): INTRAMEDULLARY (IM) NAIL TIBIAL (Right) Discharge to SNF when bed available and pt medically stable  Terry [name] 08/20/2012, 8:15 AM

## 2012-08-20 NOTE — Progress Notes (Signed)
Physical Therapy Treatment Patient Details Name: Terry [name] MRN: [id number] DOB: [birth year] Today's Date: 08/20/2012 Time: 6295-2841 PT Time Calculation (min): 30 min  PT Assessment / Plan / Recommendation Comments on Treatment Session  Pt. did mobilize in standing better than recorded 12/3. Pt continues with severe pain and HA. Pt. also on 4 litters after sats dropped this AM. encouraged IS.    Follow Up Recommendations  SNF     Does the patient have the potential to tolerate intense rehabilitation     Barriers to Discharge        Equipment Recommendations  Rolling walker with 5" wheels    Recommendations for Other Services    Frequency Min 3X/week   Plan Discharge plan remains appropriate;Frequency remains appropriate    Precautions / Restrictions Precautions Precautions: Fall Precaution Comments: LLE is weak Restrictions RLE Weight Bearing: Non weight bearing   Pertinent Vitals/Pain 8/10 R leg, has been premedicated.    Mobility  Bed Mobility Supine to Sit: 4: Min assist;HOB elevated;With rails Details for Bed Mobility Assistance: support of RLE throughout until lowered to flooor. Transfers Sit to Stand: 1: +2 Total assist;From elevated surface;With upper extremity assist;From bed Sit to Stand: Patient Percentage: 60% Stand to Sit: To chair/3-in-1;With upper extremity assist;1: +2 Total assist Stand to Sit: Patient Percentage: 50% Stand Pivot Transfers: 1: +2 Total assist Stand Pivot Transfers: Patient Percentage: 60% Details for Transfer Assistance: pt does have difficulty trying to take hopping steps to turn to reclilner. pt. has weakness LLE and trunk is flexed forward, H/o back surgery, R foot supported on PT foot to prevent sliding out until standing. Pt. is not able to keep R leg off of ground but does not appear to place weight through it. Wife to bring L shoe. Ambulation/Gait Ambulation/Gait Assistance: Not tested (comment)    Exercises     PT Diagnosis:     PT Problem List:   PT Treatment Interventions:     PT Goals Acute Rehab PT Goals Pt will go Supine/Side to Sit: with supervision PT Goal: Supine/Side to Sit - Progress: Progressing toward goal Pt will go Sit to Stand: with mod assist PT Goal: Sit to Stand - Progress: Progressing toward goal Pt will go Stand to Sit: with mod assist PT Goal: Stand to Sit - Progress: Progressing toward goal Pt will Transfer Bed to Chair/Chair to Bed: with max assist PT Transfer Goal: Bed to Chair/Chair to Bed - Progress: Progressing toward goal  Visit Information  Last PT Received On: 08/20/12 Assistance Needed: +2    Subjective Data  Subjective: I have a throbbing HA, Can I wait?   Cognition  Overall Cognitive Status: Appears within functional limits for tasks assessed/performed Arousal/Alertness: Awake/alert    Insurance risk surveyor Sitting - Balance Support: Bilateral upper extremity supported Static Sitting - Level of Assistance: 5: Stand by assistance  End of Session PT - End of Session Equipment Utilized During Treatment: Gait belt Activity Tolerance: Patient limited by fatigue;Patient limited by pain Patient left: in chair;with call bell/phone within reach;with family/visitor present Nurse Communication: Mobility status;Need for lift equipment   GP     Rada Hay 08/20/2012, 11:57 AM

## 2012-08-20 NOTE — Progress Notes (Signed)
CSW met with patient and spouse at bedside to discuss bed offers. Requesting [facility] for snf.  Terry [name] C. Camdynn Maranto MSW, LCSW [phone]

## 2012-08-20 NOTE — Progress Notes (Signed)
Patient examined and lab reviewed with Vernon PA-C. 

## 2012-08-21 DIAGNOSIS — M519 Unspecified thoracic, thoracolumbar and lumbosacral intervertebral disc disorder: Secondary | ICD-10-CM

## 2012-08-21 LAB — GLUCOSE, CAPILLARY
Glucose-Capillary: 162 mg/dL — ABNORMAL HIGH (ref 70–99)
Glucose-Capillary: 191 mg/dL — ABNORMAL HIGH (ref 70–99)

## 2012-08-21 LAB — CBC
HCT: 32.6 % — ABNORMAL LOW (ref 39.0–52.0)
Platelets: 195 10*3/uL (ref 150–400)
RDW: 21.5 % — ABNORMAL HIGH (ref 11.5–15.5)
WBC: 7.3 10*3/uL (ref 4.0–10.5)

## 2012-08-21 LAB — BASIC METABOLIC PANEL
Chloride: 96 mEq/L (ref 96–112)
GFR calc Af Amer: 61 mL/min — ABNORMAL LOW (ref >90)
Potassium: 4 mEq/L (ref 3.5–5.1)
Sodium: 135 mEq/L (ref 135–145)

## 2012-08-21 LAB — PROTIME-INR: Prothrombin Time: 15.1 seconds (ref 11.6–15.2)

## 2012-08-21 MED ORDER — ISOSORBIDE MONONITRATE ER 30 MG PO TB24
30.0000 mg | ORAL_TABLET | Freq: Every day | ORAL | Status: DC
  Administered 2012-08-21: 30 mg via ORAL
  Filled 2012-08-21 (×2): qty 1

## 2012-08-21 MED ORDER — FUROSEMIDE 40 MG PO TABS
40.0000 mg | ORAL_TABLET | Freq: Every day | ORAL | Status: DC
  Administered 2012-08-21 – 2012-08-22 (×2): 40 mg via ORAL
  Filled 2012-08-21 (×2): qty 1

## 2012-08-21 MED ORDER — TIOTROPIUM BROMIDE MONOHYDRATE 18 MCG IN CAPS
18.0000 ug | ORAL_CAPSULE | Freq: Every day | RESPIRATORY_TRACT | Status: DC
  Administered 2012-08-21 – 2012-08-22 (×2): 18 ug via RESPIRATORY_TRACT
  Filled 2012-08-21: qty 5

## 2012-08-21 NOTE — Progress Notes (Signed)
Subjective: 4 Days Post-Op Procedure(s) (LRB): INTRAMEDULLARY (IM) NAIL TIBIAL (Right) Oxygen per nasal cannula with saturation in the 90's however when Oxygen comes off he desaturates into the 70's according to nursing Personnel. Urology plans for voiding trial tomorrow s/p dilation of a distal Urethral stricture Patient reports pain as moderate.    Objective:   VITALS:  Temp:  [97.5 F (36.4 C)-98.4 F (36.9 C)] 97.5 F (36.4 C) (12/05 0549) Pulse Rate:  [71-100] 100  (12/05 0959) Resp:  [16-18] 18  (12/05 0549) BP: (117-177)/(62-95) 177/72 mmHg (12/05 0959) SpO2:  [90 %-98 %] 98 % (12/05 0549)  ABD soft Intact pulses distally Dorsiflexion/Plantar flexion intact Incision: scant drainage No cellulitis present Compartment soft   LABS  Basename 08/21/12 0506 08/20/12 0510 08/19/12 0240  HGB 10.1* 9.2* 10.0*  WBC 7.3 9.0 --  PLT 195 177 --    Basename 08/21/12 0506 08/20/12 0510  NA 135 134*  K 4.0 4.0  CL 96 95*  CO2 31 29  BUN 34* 40*  CREATININE 1.36* 1.61*  GLUCOSE 115* 123*    Basename 08/21/12 0506 08/20/12 0510  LABPT -- --  INR 1.21 1.15     Assessment/Plan: 4 Days Post-Op Procedure(s) (LRB): INTRAMEDULLARY (IM) NAIL TIBIAL (Right) COPD, ? Sleep apnea, pulmonary artery hypertension.  Discussed desaturation with Dr. Isidoro Donning, and she recommends ABGs and  possibly CPAP but likely he may need to be on continuous O2 following discharge and until he can follow up with Dr. Randa Evens of Pulmonary. Advance diet Up with therapy D/C IV fluids Continue foley due to bladder outlet obstruction Discharge to SNF after voiding trial and when medically stable from pulmonary Stand point.  NITKA,JAMES E 08/21/2012, 1:49 PM

## 2012-08-21 NOTE — Consult Note (Deleted)
PULMONARY  / CRITICAL CARE MEDICINE  Name: Terry [name] MRN: [id number] DOB: Apr 10, 1946    LOS: 5  REFERRING PROVIDER:  Elisabeth Pigeon  CHIEF COMPLAINT:  PAH  BRIEF PATIENT DESCRIPTION:  9 yobm quit smoking at adm on 11/30 for right tib/fib fracture. Had acute on what was determined to be chronically progressive dyspnea. ECHO obtained during evaluation showed PAS in 70s which was the reason for pulmonary consultation 12/3   SIGNIFICANT EVENTS:  CT chest 12/2: negative for PE. Small right effusion, some interstitial prominence      Overnight/ subjective Slow improvement sob, no cough    VITAL SIGNS: Temp:  [97.5 F (36.4 C)-98.4 F (36.9 C)] 97.6 F (36.4 C) (12/05 1418) Pulse Rate:  [71-100] 79  (12/05 1418) Resp:  [16-18] 16  (12/05 1418) BP: (153-177)/(72-95) 153/80 mmHg (12/05 1418) SpO2:  [91 %-98 %] 93 % (12/05 1418) FIO2  4lpm  PHYSICAL EXAMINATION: General:  Awake, alert, cooperative and appropriate Neuro:  No focal def.  HEENT:  [place], no JVD Cardiovascular:  rrr Lungs:  Decreased in bases.  Abdomen:  Firm, distended + bowel sounds  Musculoskeletal:  Intact  Skin:  + lower extremity swelling. Left swollen, 4+ edema. RLE dressed. CMS intact    Lab 08/21/12 0506 08/20/12 0510 08/19/12 0240  NA 135 134* 133*  K 4.0 4.0 3.8  CL 96 95* 96  CO2 31 29 28   BUN 34* 40* 41*  CREATININE 1.36* 1.61* 1.56*  GLUCOSE 115* 123* 163*    Lab 08/21/12 0506 08/20/12 0510 08/19/12 0240  HGB 10.1* 9.2* 10.0*  HCT 32.6* 31.7* 32.5*  WBC 7.3 9.0 10.3  PLT 195 177 173   No results found.  ASSESSMENT / PLAN: 1)  COPD: still actively smoking up til this admit.  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 CT negative for PE 08/18/12 3)  pulmonary edema in setting of decompensated diastolic dysfxn 4)  probable sleep apnea 5)  acute on chronic respiratory failure: due to # 1,2, and prob #3.  6)  anemia  7)  acute renal failure, resolved 8)  DM 9)  Severe  lumbar disc disease with chronic pain syndrome and multiple previous surgeries 10) s/p right tib/fib frx  This is almost certainly secondary PAH due to untreated COPD, possible OSA and on-going smoking, now w/ decompensated cor pulmonale, diastolic heart failure, some degree of pulmonary edema.   Recommendation:   - O2 24 h per day for now plus spiriva and prn albuterol -out-pt PFTs > gave him pulmonary card 12/5 with rec he see alva/ sood -out-pt PSG can be arranged @ f/u ov with Pulmonary  -prophylaxis anticoagulation, but therapeutic not needed.     Sandrea Hughs, MD Pulmonary and Critical Care Medicine [facility] Cell [phone]    08/21/2012, 3:09 PM

## 2012-08-21 NOTE — Progress Notes (Signed)
Patient ID: Terry [name]  male  EAV:[id number]    DOB: 1946-05-20    DOA: 08/16/2012  PCP: No primary provider on file.  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 Out-pt PFT's, sleep study -   Will order nocturnal pulse ox and CPAP with auto titration. Patient's wife endorses him waking up multiple times snoring at night. Unfortunately with chronic diastolic CHF, cor pulmonale, COPD/active smoking, pulmonary hypertension, he will need home O2 permanently and not easily "fixable dyspnea" at this time. - Patient will need to followup with pulmonology and cardiology outpatient closely   COPD (chronic obstructive pulmonary disease): still smoking - counseled patient on smoking cessation   Acute on chronic Diastolic CHF likely secondary to decompensated cor pulmonale - Cardiology recommendations reviewed  Urinary retention with urethral stricture: s/p bedside cysto and balloon dilation, voiding trial on Friday  Disposition: per ortho. voiding trial on Friday and per Dr Berneice Heinrich, DC foley on Friday.    Subjective: Denies any specific complaints, desat at overnight when he pulled off his O2. Wife at bedside endorses snoring at night.   Objective: Weight change:   Intake/Output Summary (Last 24 hours) at 08/21/12 1341 Last data filed at 08/21/12 1030  Gross per 24 hour   Intake    770 ml  Output   2475 ml  Net  -1705 ml   Blood pressure 177/72, pulse 100, temperature 97.5 F (36.4 C), temperature source Oral, resp. rate 18, height 6\' 1"  (1.854 m), weight 89.812 kg (198 lb), SpO2 98.00%.  Physical Exam: General: Alert and awake, oriented x3, not in any acute distress. HEENT: anicteric sclera, pupils reactive to light and accommodation, EOMI CVS: S1-S2 clear, no murmur rubs or gallops Chest: mild scattered wheezing Abdomen: soft nontender, nondistended, normal bowel sounds, no organomegaly Extremities: no cyanosis, clubbing. Right leg dressing intact  Neuro: non focal  Lab Results: Basic Metabolic Panel:  Lab 08/21/12 7829 08/20/12 0510 08/18/12 0145  NA 135 134* --  K 4.0 4.0 --  CL 96 95* --  CO2 31 29 --  GLUCOSE 115* 123* --  BUN 34* 40* --  CREATININE 1.36* 1.61* --  CALCIUM 9.0 9.0 --  MG -- -- 2.1  PHOS -- -- --   Liver Function Tests:  Lab 08/19/12 0240 08/18/12 0145  AST 26 33  ALT 10 25  ALKPHOS 88 97  BILITOT 0.4 0.5  PROT 7.1 7.0  ALBUMIN 2.5* 2.7*   No results found for this basename: LIPASE:2,AMYLASE:2 in the last 168 hours No results found for this basename: AMMONIA:2 in the last 168 hours CBC:  Lab 08/21/12 0506 08/20/12 0510 08/16/12 2029  WBC 7.3 9.0 --  NEUTROABS -- -- 4.9  HGB 10.1* 9.2* --  HCT 32.6* 31.7* --  MCV 81.1 83.0 --  PLT 195 177 --   Cardiac Enzymes:  Lab 08/18/12 0145 08/17/12 1921 08/17/12 1324  CKTOTAL -- -- --  CKMB -- -- --  CKMBINDEX -- -- --  TROPONINI 0.43* 0.40* 0.48*   BNP: No components found with this basename: POCBNP:2 CBG:  Lab 08/21/12 1151 08/21/12 0732 08/20/12 2101 08/20/12 1753 08/20/12 1211  GLUCAP 156* 127* 162* 120* 207*     Micro Results: Recent Results (from the past 240 hour(s))  URINE CULTURE     Status: Normal   Collection Time   08/16/12  9:48 PM      Component Value Range Status Comment   Specimen Description URINE, CLEAN CATCH   Final    Special  Requests NONE   Final    Culture  Setup Time 08/17/2012 04:48   Final    Colony Count >=100,000 COLONIES/ML   Final    Culture VIRIDANS STREPTOCOCCUS   Final    Report Status 08/18/2012 FINAL   Final   MRSA PCR SCREENING     Status: Normal   Collection Time   08/17/12  9:08 AM      Component Value Range Status Comment   MRSA by PCR NEGATIVE  NEGATIVE Final     Studies/Results: Dg Chest 1 View  08/17/2012  *RADIOLOGY REPORT*  Clinical Data: Shortness of breath postoperatively.  PORTABLE CHEST - 1 VIEW 08/17/2012 0656 hours:  Comparison: Portable chest x-ray yesterday.  Findings: Cardiac silhouette moderately enlarged but stable. Pulmonary venous hypertension and moderate interstitial pulmonary edema, slightly increased since the examination yesterday.  This is superimposed upon baseline chronic lung disease.  No confluent airspace consolidation.  Probable small right pleural effusion.  IMPRESSION: Slight worsening of interstitial pulmonary edema since yesterday, consistent with CHF and/or fluid overload.   Original Report Authenticated By: Hulan Saas, M.D.    Dg Tibia/fibula Right  08/17/2012  *RADIOLOGY REPORT*  Clinical Data: ORIF tib-fib fracture  RIGHT TIBIA AND FIBULA - 2 VIEW  Comparison: Preoperative radiographs 08/16/2012  Findings: Multiple intraoperative spot radiographs obtained during ORIF of distal tibia fracture with intramedullary rod with to proximal and two distal locking screws.  Alignment of the fracture fragments appears improved compared to the preoperative radiographs.  No evidence of immediate hardware complication.  IMPRESSION: ORIF of distal tibia fracture without evidence of immediate complication as above.   Original Report Authenticated By: Malachy Moan, M.D.    Dg Tibia/fibula Right  08/16/2012  *RADIOLOGY REPORT*  Clinical Data: Larey Seat out of a wheelchair and injured the right lower leg and ankle.  RIGHT TIBIA AND FIBULA - 2 VIEW  Comparison: Right ankle x-rays  obtained concurrently.  Findings: Oblique comminuted fracture involving the distal tibial metaphysis.  Nondisplaced fractures extending more inferiorly in the metaphysis, though these do not extend to the articular surface.  No fractures elsewhere involving the tibia.  Nondisplaced fracture involving the proximal fibular metaphysis along its medial cortical surface.  Chondrocalcinosis in the medial meniscus of the knee.  Mild medial compartment joint space narrowing.  IMPRESSION:  1.  Oblique, comminuted fracture involving the distal tibial metaphysis with nondisplaced fractures more distally in the metaphysis that do not extend to the articular surface. 2.  Nondisplaced fracture involving the medial cortex of the proximal fibular metaphysis. 3.  CPPD arthropathy involving the knee.   Original Report Authenticated By: Hulan Saas, M.D.    Dg Ankle Complete Right  08/16/2012  *RADIOLOGY REPORT*  Clinical Data: Larey Seat out of a wheelchair and injured the right lower leg and ankle.  RIGHT ANKLE - COMPLETE 3+ VIEW  Comparison: Right tibia-fibula x-rays obtained concurrently.  Findings: Comminuted oblique fracture involving  the distal tibial metaphysis, with a vertically oriented linear fracture extending more distally as well as a nondisplaced oblique fracture in the opposite direction more distally.  The fracture line does not appear to involve the articular surface.  No fractures about the ankle joint proper.  Ankle mortise intact.  Calcification in the soft tissues posterior to the ankle joint which are likely vascular.  IMPRESSION: Oblique, comminuted fracture involving the distal tibial metaphysis with nondisplaced fractures more distally in the metaphysis that do not extend to the articular surface. No other fractures.   Original Report Authenticated By: Hulan Saas, M.D.    Ct Angio Chest Pe W/cm &/or Wo Cm  08/18/2012  *RADIOLOGY REPORT*  Clinical Data: Pulmonary arterial hypertension of unknown  etiology. Evaluate for pulmonary embolus.  CT ANGIOGRAPHY CHEST  Technique:  Multidetector CT imaging of the chest using the standard protocol during bolus administration of intravenous contrast. Multiplanar reconstructed images including MIPs were obtained and reviewed to evaluate the vascular anatomy.  Contrast: OMNIPAQUE IOHEXOL 350 MG/ML SOLN  Comparison: 10/18/2010  Findings: Satisfactory opacification of the pulmonary arteries noted, and there is no evidence of pulmonary emboli.  No evidence of thoracic aortic aneurysm or dissection.  Mild to moderate cardiomegaly is noted.  Diffuse interstitial infiltrates are also seen bilaterally, most likely due to interstitial edema.  Small right pleural effusion and right lower lobe atelectasis also seen, and consistent with congestive heart failure.  Shotty bilateral hilar and mediastinal lymph nodes are seen measuring 1-2 cm in short axis.  These are nonspecific and most likely due to lymphedema or reactive in etiology.  No central endobronchial lesion identified.  IMPRESSION:  1.  No evidence of pulmonary embolism. 2.  Congestive heart failure with small right pleural effusion. 3.  Shotty mediastinal and bilateral hilar lymph nodes, most likely secondary to lymphedema or reactive in etiology.   Original Report Authenticated By: Myles Rosenthal, M.D.    Dg Chest Port 1 View  08/16/2012  *RADIOLOGY REPORT*  Clinical Data: Preoperative respiratory exam for distal tibial fracture on the right.  PORTABLE CHEST - 1 VIEW  Comparison: 03/27/2010  Findings: Moderate interstitial pulmonary edema present superimposed on chronic lung disease.  Stable cardiomegaly.  No large pleural effusions.  IMPRESSION: Moderate interstitial edema and cardiomegaly.   Original Report Authenticated By: Irish Lack, M.D.    Dg C-arm 1-60 Min-no Report  08/17/2012  CLINICAL DATA: tib fib fracture   C-ARM 1-60 MINUTES  Fluoroscopy was utilized by the requesting physician.  No radiographic   interpretation.      Medications: Scheduled Meds:    . antiseptic oral rinse  15 mL Mouth Rinse BID  . cefTRIAXone (ROCEPHIN)  IV  1 g Intravenous Q24H  . docusate sodium  100 mg Oral BID  . enoxaparin (LOVENOX) injection  40 mg Subcutaneous Q24H  . furosemide  40 mg Oral Daily  . insulin aspart  0-15 Units Subcutaneous TID WC  . isosorbide mononitrate  30 mg Oral Daily  . metoprolol  100 mg Oral BID  . OxyCODONE  40 mg Oral Q12H  . [DISCONTINUED] hydrochlorothiazide  25 mg Oral Daily  . [DISCONTINUED] spironolactone  25 mg Oral Daily      LOS: 5 days   Syrena Burges M.D. [facility] 08/21/2012, 1:41 PM Pager: [phone]  If 7PM-7AM, please contact night-coverage [email or web address] Password [email or web address]

## 2012-08-21 NOTE — Progress Notes (Signed)
Physical Therapy Treatment Patient Details Name: DEMITRIS POKORNY MRN: [id number] DOB: Jul 28, 1946 Today's Date: 08/21/2012 Time: 1191-4782 PT Time Calculation (min): 56 min  PT Assessment / Plan / Recommendation Comments on Treatment Session  Pt progressing slowly and plans to D/C to SNF.  Currently on 4 lts O2 with hx COPD and tobacco.  Tested on RA sats dropped to low 80's.  Spouse present during session. Pt c/o headache. Assisted pt OOB to wheelchair, performed wc mobility then assisted back to bed per pt request as R LE pain was 9/10.     Follow Up Recommendations  SNF     Does the patient have the potential to tolerate intense rehabilitation     Barriers to Discharge        Equipment Recommendations  Rolling walker with 5" wheels    Recommendations for Other Services    Frequency Min 3X/week   Plan Discharge plan remains appropriate    Precautions / Restrictions Precautions Precautions: Fall Precaution Comments: Neuropathy and weak L LE Restrictions Weight Bearing Restrictions: Yes RLE Weight Bearing: Non weight bearing Other Position/Activity Restrictions: No order for NWB, will assume NWB until notified otherwise.   Pertinent Vitals/Pain C/o 9/10 at end of session ICE applied Pain meds requested    Mobility  Bed Mobility Bed Mobility: Supine to Sit;Sitting - Scoot to Edge of Bed Supine to Sit: 4: Min assist Sitting - Scoot to [place] of Bed: 4: Min assist Details for Bed Mobility Assistance: Min assist to support R LE to minimize pain level Transfers Transfers: Sit to Stand;Stand to Sit Sit to Stand: 1: +2 Total assist;From bed;Other (comment) (wheelchair) Sit to Stand: Patient Percentage: 60% Stand to Sit: 1: +2 Total assist;To bed;Other (comment) (wheelchair) Stand to Sit: Patient Percentage: 60% Details for Transfer Assistance: Pt progressing slowlt with ability to stand @ NWB R LE and pivot just 1/4 turn.  Pt required increased time and 75% VC's on proper tech and  turn completion. Ambulation/Gait Ambulation/Gait Assistance Details: Attempted using RW and + 2 assist however unable to tolerate due to pain level and exersion. Corporate treasurer: Yes Wheelchair Assistance: 5: Financial planner Details (indicate cue type and reason): max assist for set (leg rests length/height) then able to propell self with B UE's 115 feet with 2 turns and 25% VC's on proper technique. Multiple rersts breaks due to c/o fatigue with mild SOB. Wheelchair Propulsion: Both upper extremities Wheelchair Parts Management: Supervision/cueing    PT Goals                                                          progressing    Visit Information  Last PT Received On: 08/21/12 Assistance Needed: +2    Subjective Data  Subjective: I still have a headache   Cognition       Balance     End of Session PT - End of Session Equipment Utilized During Treatment: Gait belt;Oxygen (4 lts) Activity Tolerance: Patient limited by fatigue;Patient limited by pain   Felecia Shelling  PTA [facility]  Acute  Rehab Pager     [phone]

## 2012-08-21 NOTE — Progress Notes (Signed)
PULMONARY  / CRITICAL CARE MEDICINE  Name: Terry [name] MRN: [id number] DOB: 1945-10-11    LOS: 5  REFERRING PROVIDER:  Elisabeth Pigeon  CHIEF COMPLAINT:  PAH  BRIEF PATIENT DESCRIPTION:  71 yobm quit smoking at adm on 11/30 for right tib/fib fracture. Had acute on what was determined to be chronically progressive dyspnea. ECHO obtained during evaluation showed PAS in 70s which was the reason for pulmonary consultation 12/3   SIGNIFICANT EVENTS:  CT chest 12/2: negative for PE. Small right effusion, some interstitial prominence      Overnight/ subjective Slow improvement sob, no cough    VITAL SIGNS: Temp:  [97.5 F (36.4 C)-98.4 F (36.9 C)] 97.6 F (36.4 C) (12/05 1418) Pulse Rate:  [71-100] 79  (12/05 1418) Resp:  [16-18] 16  (12/05 1418) BP: (153-177)/(72-95) 153/80 mmHg (12/05 1418) SpO2:  [91 %-98 %] 93 % (12/05 1418) FIO2  4lpm  PHYSICAL EXAMINATION: General:  Awake, alert, cooperative and appropriate Neuro:  No focal def.  HEENT:  [place], no JVD Cardiovascular:  rrr Lungs:  Decreased in bases.  Abdomen:  Firm, distended + bowel sounds  Musculoskeletal:  Intact  Skin:  + lower extremity swelling. Left swollen, 4+ edema. RLE dressed. CMS intact    Lab 08/21/12 0506 08/20/12 0510 08/19/12 0240  NA 135 134* 133*  K 4.0 4.0 3.8  CL 96 95* 96  CO2 31 29 28   BUN 34* 40* 41*  CREATININE 1.36* 1.61* 1.56*  GLUCOSE 115* 123* 163*    Lab 08/21/12 0506 08/20/12 0510 08/19/12 0240  HGB 10.1* 9.2* 10.0*  HCT 32.6* 31.7* 32.5*  WBC 7.3 9.0 10.3  PLT 195 177 173   No results found.  ASSESSMENT / PLAN: 1)  COPD: still actively smoking up til this admit.  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 CT negative for PE 08/18/12 3)  pulmonary edema in setting of decompensated diastolic dysfxn 4)  probable sleep apnea 5)  acute on chronic respiratory failure: due to # 1,2, and prob #3.  6)  anemia  7)  acute renal failure, resolved 8)  DM 9)  Severe  lumbar disc disease with chronic pain syndrome and multiple previous surgeries 10) s/p right tib/fib frx  This is almost certainly secondary PAH due to untreated COPD, possible OSA and on-going smoking, now w/ decompensated cor pulmonale, diastolic heart failure, some degree of pulmonary edema.   Recommendation:   - O2 24 h per day for now plus spiriva and prn albuterol -out-pt PFTs > gave him pulmonary card 12/5 with rec he see alva/ sood -out-pt PSG can be arranged @ f/u ov with Pulmonary  -prophylaxis anticoagulation, but therapeutic not needed.     Sandrea Hughs, MD Pulmonary and Critical Care Medicine [facility] Cell [phone]    08/21/2012, 3:19 PM

## 2012-08-22 DIAGNOSIS — D62 Acute posthemorrhagic anemia: Secondary | ICD-10-CM | POA: Diagnosis not present

## 2012-08-22 DIAGNOSIS — IMO0002 Reserved for concepts with insufficient information to code with codable children: Secondary | ICD-10-CM | POA: Diagnosis present

## 2012-08-22 DIAGNOSIS — N35919 Unspecified urethral stricture, male, unspecified site: Secondary | ICD-10-CM

## 2012-08-22 LAB — BASIC METABOLIC PANEL
CO2: 35 mEq/L — ABNORMAL HIGH (ref 19–32)
Calcium: 9.1 mg/dL (ref 8.4–10.5)
Chloride: 95 mEq/L — ABNORMAL LOW (ref 96–112)
Creatinine, Ser: 1.29 mg/dL (ref 0.50–1.35)
Glucose, Bld: 120 mg/dL — ABNORMAL HIGH (ref 70–99)
Sodium: 136 mEq/L (ref 135–145)

## 2012-08-22 LAB — CBC
Hemoglobin: 10 g/dL — ABNORMAL LOW (ref 13.0–17.0)
MCH: 24.9 pg — ABNORMAL LOW (ref 26.0–34.0)
MCV: 82.3 fL (ref 78.0–100.0)
RBC: 4.01 MIL/uL — ABNORMAL LOW (ref 4.22–5.81)
WBC: 7.8 10*3/uL (ref 4.0–10.5)

## 2012-08-22 LAB — GLUCOSE, CAPILLARY
Glucose-Capillary: 118 mg/dL — ABNORMAL HIGH (ref 70–99)
Glucose-Capillary: 160 mg/dL — ABNORMAL HIGH (ref 70–99)

## 2012-08-22 LAB — PROTIME-INR: INR: 1.14 (ref 0.00–1.49)

## 2012-08-22 MED ORDER — ISOSORBIDE MONONITRATE ER 60 MG PO TB24: 60.0000 mg | ORAL_TABLET | Freq: Every day | ORAL | Status: DC

## 2012-08-22 MED ORDER — OXYCODONE HCL 5 MG PO TABS: 5.0000 mg | ORAL_TABLET | ORAL | Status: DC | PRN

## 2012-08-22 MED ORDER — INSULIN ASPART 100 UNIT/ML ~~LOC~~ SOLN: 0.0000 [IU] | Freq: Three times a day (TID) | SUBCUTANEOUS | Status: AC

## 2012-08-22 MED ORDER — POLYETHYLENE GLYCOL 3350 17 G PO PACK: 17.0000 g | PACK | Freq: Every day | ORAL | Status: DC | PRN

## 2012-08-22 MED ORDER — ISOSORBIDE MONONITRATE ER 60 MG PO TB24
60.0000 mg | ORAL_TABLET | Freq: Every day | ORAL | Status: DC
  Administered 2012-08-22: 60 mg via ORAL
  Filled 2012-08-22: qty 1

## 2012-08-22 MED ORDER — METHOCARBAMOL 500 MG PO TABS: 500.0000 mg | ORAL_TABLET | Freq: Four times a day (QID) | ORAL | Status: DC | PRN

## 2012-08-22 MED ORDER — METOPROLOL TARTRATE 50 MG PO TABS: 100.0000 mg | ORAL_TABLET | Freq: Two times a day (BID) | ORAL | Status: DC

## 2012-08-22 MED ORDER — OXYCODONE HCL ER 40 MG PO T12A: 40.0000 mg | EXTENDED_RELEASE_TABLET | Freq: Two times a day (BID) | ORAL | Status: AC

## 2012-08-22 MED ORDER — FUROSEMIDE 40 MG PO TABS: 40.0000 mg | ORAL_TABLET | Freq: Every day | ORAL | Status: DC

## 2012-08-22 MED ORDER — TIOTROPIUM BROMIDE MONOHYDRATE 18 MCG IN CAPS: 18.0000 ug | ORAL_CAPSULE | Freq: Every day | RESPIRATORY_TRACT | Status: AC

## 2012-08-22 MED ORDER — BISACODYL 5 MG PO TBEC: 5.0000 mg | DELAYED_RELEASE_TABLET | Freq: Every day | ORAL | Status: DC | PRN

## 2012-08-22 MED ORDER — ASPIRIN EC 325 MG PO TBEC: 325.0000 mg | DELAYED_RELEASE_TABLET | Freq: Two times a day (BID) | ORAL | Status: DC

## 2012-08-22 MED ORDER — ALBUTEROL SULFATE (5 MG/ML) 0.5% IN NEBU: 2.5000 mg | INHALATION_SOLUTION | Freq: Two times a day (BID) | RESPIRATORY_TRACT | Status: AC

## 2012-08-22 MED ORDER — DSS 100 MG PO CAPS: 100.0000 mg | ORAL_CAPSULE | Freq: Two times a day (BID) | ORAL | Status: DC

## 2012-08-22 NOTE — Progress Notes (Signed)
Patient cleared for discharge to [facility]. Packet copied and placed in [place]. ptar called for transportation.  Oryan Winterton C. Sol Englert MSW, LCSW [phone]

## 2012-08-22 NOTE — Progress Notes (Signed)
Subjective: 5 Days Post-Op Procedure(s) (LRB): INTRAMEDULLARY (IM) NAIL TIBIAL (Right) Awake, alert Ox4. O2 pnc recommended as well as CPAP and spiriva. Numerous medical conditions and comorbidites. Will rely on medicine to Order the necessary medications and pulmonary aids at the time of discharge Today. Foley discontinued and currently trying to void, not successful as yet, still early. Patient reports pain as moderate.    Objective:   VITALS:  Temp:  [97.6 F (36.4 C)-98.2 F (36.8 C)] 98 F (36.7 C) (12/06 0603) Pulse Rate:  [76-100] 76  (12/06 0603) Resp:  [16-20] 20  (12/06 0603) BP: (153-177)/(72-85) 177/85 mmHg (12/06 0603) SpO2:  [90 %-94 %] 94 % (12/06 0603)  Neurologically intact ABD soft Sensation intact distally Intact pulses distally Dorsiflexion/Plantar flexion intact Incision: no drainage No cellulitis present Compartment soft   LABS  Basename 08/22/12 0454 08/21/12 0506 08/20/12 0510  HGB 10.0* 10.1* 9.2*  WBC 7.8 7.3 --  PLT 190 195 --    Basename 08/22/12 0454 08/21/12 0506  NA 136 135  K 3.7 4.0  CL 95* 96  CO2 35* 31  BUN 31* 34*  CREATININE 1.29 1.36*  GLUCOSE 120* 115*    Basename 08/22/12 0454 08/21/12 0506  LABPT -- --  INR 1.14 1.21     Assessment/Plan: 5 Days Post-Op Procedure(s) (LRB): INTRAMEDULLARY (IM) NAIL TIBIAL (Right) COPD Respiratory decompensation improved Peripheral neuropathy. Thoracic spinal stenosis Diabetes mellitus with peripheral artery disease and peripheral neuropathy Diastolic CHF Obesity Chronic pain syndrome  Advance diet Up with therapy D/C IV fluids Discharge to SNF Discontinue IVF Continuous Oxygen as per Dr. Ocie Doyne and spiriva and CPAP These discharge meds and pulmonary aides to be ordered by hospitalist and  Pulmonary. Emiya Loomer E 08/22/2012, 8:14 AM

## 2012-08-22 NOTE — Progress Notes (Signed)
Patient ID: Terry [name]  male  FAO:[id number]    DOB: 27-Jan-1946    DOA: 08/16/2012  PCP: No primary provider on file.  RECOMMENDATIONS FOR DISCHARGE:  I have edited the AVS for following recommendations - Metoprolol home dose increased to 100mg  BID - Imdur added during hospitalization 60mg  daily - DC'ed HCTZ and aladactone - Cont Lasix, recheck BMET on Monday - He needs to be on Albuterol Neb BID and q4hrs PRN, spiriva, Oxygen 3-4L continous - Avoid metformin. Cont Sliding scale insulin - He needs close Pulmonary and Cardiology follow-ups - Please arrange follow-up with Dr Berneice Heinrich (urology) asap for recommendations.   - Prophylactic anticoagulation per Ortho  Assessment/Plan: Principal Problem:  *Fracture of tibia, distal, right, closed: per Orthopedics (primary)  Active Problems:  Diabetes mellitus type 2 with peripheral artery disease - Continue sliding scale insulin   Hypertension : - Continue beta blocker, increased Imdur, I have DC'ed HCTZ and aladactone   - cont lasix   Anemia: H/H borderline   Acute kidney injury:Secondary to combination of Lasix as well as contrast-induced nephropathy, improving     Secondary pulmonary hypertension with O2 desat at night: patient refused CPAP at night - Reviewed pulmonary note, will need nebs,O2, spiriva out-patient. Out-pt PFT's, sleep study - Patient's wife endorses him waking up multiple times snoring at night. Unfortunately with chronic diastolic CHF, cor pulmonale, COPD/active smoking, pulmonary hypertension, he will need home O2 permanently and not easily "fixable dyspnea" at this time. - Patient will need to followup with pulmonology and cardiology outpatient closely   COPD (chronic obstructive pulmonary disease): still smoking - counseled patient on smoking cessation   Acute on chronic Diastolic CHF likely secondary to decompensated cor pulmonale - Cardiology recommendations reviewed  Urinary retention with urethral stricture:  s/p bedside cysto and balloon dilation, voiding trial on Friday, F/U with Dr Berneice Heinrich  Disposition: per ortho.   Subjective: Denies any specific complaints, refused CPAP last night  Objective: Weight change:   Intake/Output Summary (Last 24 hours) at 08/22/12 1011 Last data filed at 08/22/12 0900  Gross per 24 hour  Intake   1420 ml  Output   3875 ml  Net  -2455 ml   Blood pressure 174/83, pulse 93, temperature 98 F (36.7 C), temperature source Oral, resp. rate 20, height 6\' 1"  (1.854 m), weight 89.812 kg (198 lb), SpO2 87.00%.  Physical Exam: General: Alert and awake, oriented x3, not in any acute distress. HEENT: anicteric sclera, pupils reactive to light and accommodation, EOMI CVS: S1-S2 clear, no murmur rubs or gallops Chest: mild scattered wheezing Abdomen: soft nontender, nondistended, normal bowel sounds, no organomegaly Extremities: no cyanosis, clubbing. Right leg dressing intact  Neuro: non focal  Lab Results: Basic Metabolic Panel:  Lab 08/22/12 6962 08/21/12 0506 08/18/12 0145  NA 136 135 --  K 3.7 4.0 --  CL 95* 96 --  CO2 35* 31 --  GLUCOSE 120* 115* --  BUN 31* 34* --  CREATININE 1.29 1.36* --  CALCIUM 9.1 9.0 --  MG -- -- 2.1  PHOS -- -- --   Liver Function Tests:  Lab 08/19/12 0240 08/18/12 0145  AST 26 33  ALT 10 25  ALKPHOS 88 97  BILITOT 0.4 0.5  PROT 7.1 7.0  ALBUMIN 2.5* 2.7*   No results found for this basename: LIPASE:2,AMYLASE:2 in the last 168 hours No results found for this basename: AMMONIA:2 in the last 168 hours CBC:  Lab 08/22/12 0454 08/21/12 0506 08/16/12 2029  WBC 7.8  7.3 --  NEUTROABS -- -- 4.9  HGB 10.0* 10.1* --  HCT 33.0* 32.6* --  MCV 82.3 81.1 --  PLT 190 195 --   Cardiac Enzymes:  Lab 08/18/12 0145 08/17/12 1921 08/17/12 1324  CKTOTAL -- -- --  CKMB -- -- --  CKMBINDEX -- -- --  TROPONINI 0.43* 0.40* 0.48*   BNP: No components found with this basename: POCBNP:2 CBG:  Lab 08/22/12 0736 08/21/12 2134  08/21/12 1604 08/21/12 1151 08/21/12 0732  GLUCAP 118* 191* 142* 156* 127*     Micro Results: Recent Results (from the past 240 hour(s))  URINE CULTURE     Status: Normal   Collection Time   08/16/12  9:48 PM      Component Value Range Status Comment   Specimen Description URINE, CLEAN CATCH   Final    Special Requests NONE   Final    Culture  Setup Time 08/17/2012 04:48   Final    Colony Count >=100,000 COLONIES/ML   Final    Culture VIRIDANS STREPTOCOCCUS   Final    Report Status 08/18/2012 FINAL   Final   MRSA PCR SCREENING     Status: Normal   Collection Time   08/17/12  9:08 AM      Component Value Range Status Comment   MRSA by PCR NEGATIVE  NEGATIVE Final     Studies/Results: Dg Chest 1 View  08/17/2012  *RADIOLOGY REPORT*  Clinical Data: Shortness of breath postoperatively.  PORTABLE CHEST - 1 VIEW 08/17/2012 0656 hours:  Comparison: Portable chest x-ray yesterday.  Findings: Cardiac silhouette moderately enlarged but stable. Pulmonary venous hypertension and moderate interstitial pulmonary edema, slightly increased since the examination yesterday.  This is superimposed upon baseline chronic lung disease.  No confluent airspace consolidation.  Probable small right pleural effusion.  IMPRESSION: Slight worsening of interstitial pulmonary edema since yesterday, consistent with CHF and/or fluid overload.   Original Report Authenticated By: Hulan Saas, M.D.    Dg Tibia/fibula Right  08/17/2012  *RADIOLOGY REPORT*  Clinical Data: ORIF tib-fib fracture  RIGHT TIBIA AND FIBULA - 2 VIEW  Comparison: Preoperative radiographs 08/16/2012  Findings: Multiple intraoperative spot radiographs obtained during ORIF of distal tibia fracture with intramedullary rod with to proximal and two distal locking screws.  Alignment of the fracture fragments appears improved compared to the preoperative radiographs.  No evidence of immediate hardware complication.  IMPRESSION: ORIF of distal tibia  fracture without evidence of immediate complication as above.   Original Report Authenticated By: Malachy Moan, M.D.    Dg Tibia/fibula Right  08/16/2012  *RADIOLOGY REPORT*  Clinical Data: Larey Seat out of a wheelchair and injured the right lower leg and ankle.  RIGHT TIBIA AND FIBULA - 2 VIEW  Comparison: Right ankle x-rays obtained concurrently.  Findings: Oblique comminuted fracture involving the distal tibial metaphysis.  Nondisplaced fractures extending more inferiorly in the metaphysis, though these do not extend to the articular surface.  No fractures elsewhere involving the tibia.  Nondisplaced fracture involving the proximal fibular metaphysis along its medial cortical surface.  Chondrocalcinosis in the medial meniscus of the knee.  Mild medial compartment joint space narrowing.  IMPRESSION:  1.  Oblique, comminuted fracture involving the distal tibial metaphysis with nondisplaced fractures more distally in the metaphysis that do not extend to the articular surface. 2.  Nondisplaced fracture involving the medial cortex of the proximal fibular metaphysis. 3.  CPPD arthropathy involving the knee.   Original Report Authenticated By: Hulan Saas, M.D.  Dg Ankle Complete Right  08/16/2012  *RADIOLOGY REPORT*  Clinical Data: Larey Seat out of a wheelchair and injured the right lower leg and ankle.  RIGHT ANKLE - COMPLETE 3+ VIEW  Comparison: Right tibia-fibula x-rays obtained concurrently.  Findings: Comminuted oblique fracture involving the distal tibial metaphysis, with a vertically oriented linear fracture extending more distally as well as a nondisplaced oblique fracture in the opposite direction more distally.  The fracture line does not appear to involve the articular surface.  No fractures about the ankle joint proper.  Ankle mortise intact.  Calcification in the soft tissues posterior to the ankle joint which are likely vascular.  IMPRESSION: Oblique, comminuted fracture involving the distal  tibial metaphysis with nondisplaced fractures more distally in the metaphysis that do not extend to the articular surface. No other fractures.   Original Report Authenticated By: Hulan Saas, M.D.    Ct Angio Chest Pe W/cm &/or Wo Cm  08/18/2012  *RADIOLOGY REPORT*  Clinical Data: Pulmonary arterial hypertension of unknown etiology. Evaluate for pulmonary embolus.  CT ANGIOGRAPHY CHEST  Technique:  Multidetector CT imaging of the chest using the standard protocol during bolus administration of intravenous contrast. Multiplanar reconstructed images including MIPs were obtained and reviewed to evaluate the vascular anatomy.  Contrast: OMNIPAQUE IOHEXOL 350 MG/ML SOLN  Comparison: 10/18/2010  Findings: Satisfactory opacification of the pulmonary arteries noted, and there is no evidence of pulmonary emboli.  No evidence of thoracic aortic aneurysm or dissection.  Mild to moderate cardiomegaly is noted.  Diffuse interstitial infiltrates are also seen bilaterally, most likely due to interstitial edema.  Small right pleural effusion and right lower lobe atelectasis also seen, and consistent with congestive heart failure.  Shotty bilateral hilar and mediastinal lymph nodes are seen measuring 1-2 cm in short axis.  These are nonspecific and most likely due to lymphedema or reactive in etiology.  No central endobronchial lesion identified.  IMPRESSION:  1.  No evidence of pulmonary embolism. 2.  Congestive heart failure with small right pleural effusion. 3.  Shotty mediastinal and bilateral hilar lymph nodes, most likely secondary to lymphedema or reactive in etiology.   Original Report Authenticated By: Myles Rosenthal, M.D.    Dg Chest Port 1 View  08/16/2012  *RADIOLOGY REPORT*  Clinical Data: Preoperative respiratory exam for distal tibial fracture on the right.  PORTABLE CHEST - 1 VIEW  Comparison: 03/27/2010  Findings: Moderate interstitial pulmonary edema present superimposed on chronic lung disease.   Stable cardiomegaly.  No large pleural effusions.  IMPRESSION: Moderate interstitial edema and cardiomegaly.   Original Report Authenticated By: Irish Lack, M.D.    Dg C-arm 1-60 Min-no Report  08/17/2012  CLINICAL DATA: tib fib fracture   C-ARM 1-60 MINUTES  Fluoroscopy was utilized by the requesting physician.  No radiographic  interpretation.      Medications: Scheduled Meds:    . antiseptic oral rinse  15 mL Mouth Rinse BID  . cefTRIAXone (ROCEPHIN)  IV  1 g Intravenous Q24H  . docusate sodium  100 mg Oral BID  . enoxaparin (LOVENOX) injection  40 mg Subcutaneous Q24H  . furosemide  40 mg Oral Daily  . insulin aspart  0-15 Units Subcutaneous TID WC  . isosorbide mononitrate  60 mg Oral Daily  . metoprolol  100 mg Oral BID  . OxyCODONE  40 mg Oral Q12H  . tiotropium  18 mcg Inhalation Daily  . [DISCONTINUED] hydrochlorothiazide  25 mg Oral Daily  . [DISCONTINUED] isosorbide mononitrate  30 mg Oral  Daily  . [DISCONTINUED] spironolactone  25 mg Oral Daily      LOS: 6 days   RAI,RIPUDEEP M.D. [facility] 08/22/2012, 10:11 AM Pager: [phone]  If 7PM-7AM, please contact night-coverage [email or web address] Password [email or web address]

## 2012-08-22 NOTE — Progress Notes (Signed)
Spoke with pt in regard to wearing cpap tonight.  Pt is having muscle spasms and is in a lot of pain at this time.  Pt prefers to wait on trying cpap tonight until he is ready for bed and his pain has subsided.  RN aware.

## 2012-08-22 NOTE — Progress Notes (Signed)
Patient's BP was 177/85 this morning after removing the foley and shifting patient's right leg. MD on call, Cleophas Dunker, was notified and said the BP was ok until the MD's made their rounds. RN attempted to notify NP, Craige Cotta, on call but got no response.

## 2012-08-22 NOTE — Progress Notes (Signed)
Patient's pain has consistently been between 8-10. Patient has received morphine 4mg  IV and oxy IR 10mg  PO. PA on call was notified and instructed RN to go ahead and give the robaxin IV early. Patient's leg is elevated on 2 pillows with 2 ice packs. Patient has full sensation and movement in the right foot and is warm to the touch. RN will continue to watch closely.

## 2012-08-22 NOTE — Discharge Summary (Addendum)
Physician Discharge Summary  Patient ID: Terry [name] MRN: [id number] DOB/AGE: 1946-03-04 66 y.o.  Admit date: 08/16/2012 Discharge date:   Admission Diagnoses:  Principal Problem:  *Fracture of tibia, distal, right, closed Active Problems:  Chronic urethral stricture  Diabetes mellitus type 2 with peripheral artery disease  Hypertension associated with diabetes  Anemia associated with acute blood loss  Anemia  Acute kidney injury  Coagulopathy  Secondary pulmonary hypertension  Hypertensive heart disease  COPD (chronic obstructive pulmonary disease)  Diastolic CHF, chronic   Discharge Diagnoses:  Same  Past Medical History  Diagnosis Date  . Diabetes mellitus without complication   . Peripheral vascular disease   . Hypertensive heart disease   . Obesity (BMI 30-39.9)   . Lumbar disc disease     Surgeries: Procedure(s): INTRAMEDULLARY (IM) NAIL TIBIAL on 08/16/2012 - 08/17/2012 CT angiogram of the chest 08/18/2012 Consultants: Treatment Team:  Alison Murray, MD Sebastian Ache, MD Sandrea Hughs M.D. Discharged Condition: Improved  Hospital Course: SHAY BARTOLI is an 66 y.o. male who was admitted 08/16/2012 with a chief complaint of  Chief Complaint  Patient presents with  . Leg Injury  , and found to have a diagnosis of Fracture of tibia, distal, right, closed.  They were brought to the operating room on 08/16/2012 - 08/17/2012 and underwent the above named procedures. Postoperatively the patient was placed in the PACU and there he showed respiratory decompensation with low O2 saturations dyspnea and confusion. Chest x-ray showed worsening congestive heart failure and chronic COPD changes. Patient was transferred to the intensive care unit and [facility] hospitalists consulted. Patient underwent diuresis and evaluation for both cardiac and pulmonary concerns. Was placed on anticoagulation and underwent CT scan angiogram to rule out pulmonary embolism. Cardiology consult was  obtained Dr. Vivi Ferns and the patient seen by Dr. Sena Slate intensivist/pulmonology. No pulmonary embolism found and patient showed some changes of anemia there were mild with hemoglobin stabilizing at 10.4. He had renal insufficiency glucose intolerance with acute renal injury secondary to contrast for the CT angiogram. It is improved throughout the patient's hospitalization. Patient's pulmonary status improved with BiPAP diuresis and intense pulmonary care. He was started on Rocephin to prophylax against any pulmonary infection. Note the patient also was seen by urology as he had a phimosis with a severe distal urethral stricture which required dilation of orders placed Foley for strict I&O and diuresis. On postoperative day #4 the patient was able to be transferred to MedSurg floor where he continued to make progress. Patient prior to this fall and injury to his right lower extremity was severely limited in terms of his ability to function he has had over 8 previous spine surgeries by neurosurgery and has known thoracic stenosis. He had been ambulatory within the household using a walker prior to the right lower extremity injury. Physical therapy and occupational therapy was worked with this patient during his hospitalization and noted extreme dependencies do to inability to bear weight on the right lower extremity which will be necessary for a period of 6-8 weeks. Therefore recommended post hospitalization skilled nursing facility. Social services and discharge Comptroller and patient did fine a bed and the [facility] skilled nursing facility. While at home in general the MedSurg floor the patient showed tendencies to desaturation with an O2 was removed. Saturations into the 70% level. Dr. Casimiro Needle wert saw the patient and Dr. Isidoro Donning of hospitalists. Patient was started on CPAP continuous O2 per nasal cannula and it is recommended  that he undergo outpatient evaluation for sleep apnea and outpatient  pulmonary testing. Note that is an echocardiogram done during his intensive care staycor pulmonale with pulmonary artery pressures of about 70. Patient is discontinued smoking during this hospitalization. Workup will causes of cor pulmonale show no sign of pulmonary embolism. During the patient's hospitalization he was maintained on Lovenox. However due to high-risk of falls the future due to the problems with COPD decrease O2 level and severe ataxia and lower gently weakness patient will be maintained on aspirin 325 mg twice a day following his discharge from was a long hospital. On 08/22/2012 the patient had discontinuance of his Foley catheter per urology. He underwent a voiding trial today and he was able void without feelings of residual x 2 no further Foley catheter necessary. Patient was transferred to [facility] skilled nursing facility on 08/22/2012 at the time of his discharge he demonstrated severe limitations of ambulation requiring significant standby assistance in transferring bed to chair bed to wheelchair nonweightbearing on the right lower extremity full weightbearing as tolerated on the left lower extremity and both upper sham the case. Patient requires continuous O2 per nasal cannula and will require CPAP at nighttime for presumed sleep apnea. Spiriva was ordered.  They were given perioperative antibiotics:      Anti-infectives     Start     Dose/Rate Route Frequency Ordered Stop   08/19/12 1200   cefTRIAXone (ROCEPHIN) 1 g in dextrose 5 % 50 mL IVPB        1 g 100 mL/hr over 30 Minutes Intravenous Every 24 hours 08/19/12 1102     08/17/12 0901   ceFAZolin (ANCEF) 3 g in dextrose 5 % 50 mL IVPB        3 g 160 mL/hr over 30 Minutes Intravenous 60 min pre-op 08/17/12 0901 08/17/12 1119   08/17/12 0630   ceFAZolin (ANCEF) IVPB 2 g/50 mL premix        2 g 100 mL/hr over 30 Minutes Intravenous Every 6 hours 08/17/12 0322 08/17/12 1816        .  They were given sequential  compression devices, early ambulation, and chemoprophylaxis for DVT prophylaxis.  They benefited maximally from their hospital stay and there were no complications.    Recent vital signs:  Filed Vitals:   08/22/12 0900  BP: 174/83  Pulse: 93  Temp:   Resp:     Recent laboratory studies:  Results for orders placed during the hospital encounter of 08/16/12  CBC WITH DIFFERENTIAL      Component Value Range   WBC 8.2  4.0 - 10.5 K/uL   RBC 4.29  4.22 - 5.81 MIL/uL   Hemoglobin 10.7 (*) 13.0 - 17.0 g/dL   HCT 91.4 (*) 78.2 - 95.6 %   MCV 82.5  78.0 - 100.0 fL   MCH 24.9 (*) 26.0 - 34.0 pg   MCHC 30.2  30.0 - 36.0 g/dL   RDW 21.3 (*) 08.6 - 57.8 %   Platelets 217  150 - 400 K/uL   Neutrophils Relative 61  43 - 77 %   Lymphocytes Relative 24  12 - 46 %   Monocytes Relative 13 (*) 3 - 12 %   Eosinophils Relative 1  0 - 5 %   Basophils Relative 1  0 - 1 %   Neutro Abs 4.9  1.7 - 7.7 K/uL   Lymphs Abs 2.0  0.7 - 4.0 K/uL   Monocytes Absolute 1.1 (*) 0.1 -  1.0 K/uL   Eosinophils Absolute 0.1  0.0 - 0.7 K/uL   Basophils Absolute 0.1  0.0 - 0.1 K/uL   RBC Morphology POLYCHROMASIA PRESENT    COMPREHENSIVE METABOLIC PANEL      Component Value Range   Sodium 139  135 - 145 mEq/L   Potassium 3.6  3.5 - 5.1 mEq/L   Chloride 101  96 - 112 mEq/L   CO2 26  19 - 32 mEq/L   Glucose, Bld 81  70 - 99 mg/dL   BUN 50 (*) 6 - 23 mg/dL   Creatinine, Ser 1.61 (*) 0.50 - 1.35 mg/dL   Calcium 8.8  8.4 - 09.6 mg/dL   Total Protein 8.0  6.0 - 8.3 g/dL   Albumin 3.2 (*) 3.5 - 5.2 g/dL   AST 57 (*) 0 - 37 U/L   ALT 54 (*) 0 - 53 U/L   Alkaline Phosphatase 118 (*) 39 - 117 U/L   Total Bilirubin 0.6  0.3 - 1.2 mg/dL   GFR calc non Af Amer 47 (*) >90 mL/min   GFR calc Af Amer 55 (*) >90 mL/min  PROTIME-INR      Component Value Range   Prothrombin Time 17.8 (*) 11.6 - 15.2 seconds   INR 1.51 (*) 0.00 - 1.49  URINALYSIS, ROUTINE W REFLEX MICROSCOPIC      Component Value Range   Color, Urine  YELLOW  YELLOW   APPearance CLOUDY (*) CLEAR   Specific Gravity, Urine 1.017  1.005 - 1.030   pH 5.0  5.0 - 8.0   Glucose, UA NEGATIVE  NEGATIVE mg/dL   Hgb urine dipstick TRACE (*) NEGATIVE   Bilirubin Urine NEGATIVE  NEGATIVE   Ketones, ur NEGATIVE  NEGATIVE mg/dL   Protein, ur 045 (*) NEGATIVE mg/dL   Urobilinogen, UA 0.2  0.0 - 1.0 mg/dL   Nitrite NEGATIVE  NEGATIVE   Leukocytes, UA SMALL (*) NEGATIVE  HEMOGLOBIN A1C      Component Value Range   Hemoglobin A1C 7.6 (*) <5.7 %   Mean Plasma Glucose 171 (*) <117 mg/dL  URINE MICROSCOPIC-ADD ON      Component Value Range   Squamous Epithelial / LPF RARE  RARE   WBC, UA 7-10  <3 WBC/hpf   RBC / HPF 0-2  <3 RBC/hpf   Bacteria, UA MANY (*) RARE  URINE CULTURE      Component Value Range   Specimen Description URINE, CLEAN CATCH     Special Requests NONE     Culture  Setup Time 08/17/2012 04:48     Colony Count >=100,000 COLONIES/ML     Culture VIRIDANS STREPTOCOCCUS     Report Status 08/18/2012 FINAL    GLUCOSE, CAPILLARY      Component Value Range   Glucose-Capillary 105 (*) 70 - 99 mg/dL  GLUCOSE, CAPILLARY      Component Value Range   Glucose-Capillary 107 (*) 70 - 99 mg/dL  COMPREHENSIVE METABOLIC PANEL      Component Value Range   Sodium 140  135 - 145 mEq/L   Potassium 3.7  3.5 - 5.1 mEq/L   Chloride 102  96 - 112 mEq/L   CO2 25  19 - 32 mEq/L   Glucose, Bld 112 (*) 70 - 99 mg/dL   BUN 41 (*) 6 - 23 mg/dL   Creatinine, Ser 4.09  0.50 - 1.35 mg/dL   Calcium 8.6  8.4 - 81.1 mg/dL   Total Protein 7.5  6.0 - 8.3 g/dL  Albumin 2.9 (*) 3.5 - 5.2 g/dL   AST 49 (*) 0 - 37 U/L   ALT 45  0 - 53 U/L   Alkaline Phosphatase 103  39 - 117 U/L   Total Bilirubin 0.5  0.3 - 1.2 mg/dL   GFR calc non Af Amer 58 (*) >90 mL/min   GFR calc Af Amer 67 (*) >90 mL/min  GLUCOSE, CAPILLARY      Component Value Range   Glucose-Capillary 92  70 - 99 mg/dL  URINE RAPID DRUG SCREEN (HOSP PERFORMED)      Component Value Range   Opiates  POSITIVE (*) NONE DETECTED   Cocaine NONE DETECTED  NONE DETECTED   Benzodiazepines NONE DETECTED  NONE DETECTED   Amphetamines NONE DETECTED  NONE DETECTED   Tetrahydrocannabinol NONE DETECTED  NONE DETECTED   Barbiturates NONE DETECTED  NONE DETECTED  ETHANOL      Component Value Range   Alcohol, Ethyl (B) <11  0 - 11 mg/dL  BLOOD GAS, ARTERIAL      Component Value Range   O2 Content 10.0     Delivery systems SIMPLE MASK     pH, Arterial 7.312 (*) 7.350 - 7.450   pCO2 arterial 48.0 (*) 35.0 - 45.0 mmHg   pO2, Arterial 77.9 (*) 80.0 - 100.0 mmHg   Bicarbonate 23.6  20.0 - 24.0 mEq/L   TCO2 22.2  0 - 100 mmol/L   Acid-base deficit 2.3 (*) 0.0 - 2.0 mmol/L   O2 Saturation 94.1     Patient temperature 98.4     Collection site RIGHT RADIAL     Drawn by [id number]     Sample type ARTERIAL     Allens test (pass/fail) PASS  PASS  PRO B NATRIURETIC PEPTIDE      Component Value Range   Pro B Natriuretic peptide (BNP) 3818.0 (*) 0 - 125 pg/mL  TROPONIN I      Component Value Range   Troponin I 0.55 (*) <0.30 ng/mL  MRSA PCR SCREENING      Component Value Range   MRSA by PCR NEGATIVE  NEGATIVE  GLUCOSE, CAPILLARY      Component Value Range   Glucose-Capillary 106 (*) 70 - 99 mg/dL  GLUCOSE, CAPILLARY      Component Value Range   Glucose-Capillary 120 (*) 70 - 99 mg/dL   Comment 1 Documented in Chart     Comment 2 Notify RN    TROPONIN I      Component Value Range   Troponin I 0.48 (*) <0.30 ng/mL  TROPONIN I      Component Value Range   Troponin I 0.40 (*) <0.30 ng/mL  TROPONIN I      Component Value Range   Troponin I 0.43 (*) <0.30 ng/mL  GLUCOSE, CAPILLARY      Component Value Range   Glucose-Capillary 256 (*) 70 - 99 mg/dL   Comment 1 Documented in Chart     Comment 2 Notify RN    COMPREHENSIVE METABOLIC PANEL      Component Value Range   Sodium 138  135 - 145 mEq/L   Potassium 3.4 (*) 3.5 - 5.1 mEq/L   Chloride 100  96 - 112 mEq/L   CO2 29  19 - 32 mEq/L    Glucose, Bld 157 (*) 70 - 99 mg/dL   BUN 36 (*) 6 - 23 mg/dL   Creatinine, Ser 0.45 (*) 0.50 - 1.35 mg/dL   Calcium 8.7  8.4 - 40.9  mg/dL   Total Protein 7.0  6.0 - 8.3 g/dL   Albumin 2.7 (*) 3.5 - 5.2 g/dL   AST 33  0 - 37 U/L   ALT 25  0 - 53 U/L   Alkaline Phosphatase 97  39 - 117 U/L   Total Bilirubin 0.5  0.3 - 1.2 mg/dL   GFR calc non Af Amer 52 (*) >90 mL/min   GFR calc Af Amer 61 (*) >90 mL/min  CBC      Component Value Range   WBC 10.4  4.0 - 10.5 K/uL   RBC 4.13 (*) 4.22 - 5.81 MIL/uL   Hemoglobin 10.3 (*) 13.0 - 17.0 g/dL   HCT 40.9 (*) 81.1 - 91.4 %   MCV 83.1  78.0 - 100.0 fL   MCH 24.9 (*) 26.0 - 34.0 pg   MCHC 30.0  30.0 - 36.0 g/dL   RDW 78.2 (*) 95.6 - 21.3 %   Platelets 197  150 - 400 K/uL  MAGNESIUM      Component Value Range   Magnesium 2.1  1.5 - 2.5 mg/dL  GLUCOSE, CAPILLARY      Component Value Range   Glucose-Capillary 133 (*) 70 - 99 mg/dL   Comment 1 Notify RN    GLUCOSE, CAPILLARY      Component Value Range   Glucose-Capillary 110 (*) 70 - 99 mg/dL   Comment 1 Documented in Chart     Comment 2 Notify RN    GLUCOSE, CAPILLARY      Component Value Range   Glucose-Capillary 172 (*) 70 - 99 mg/dL   Comment 1 Documented in Chart     Comment 2 Notify RN    GLUCOSE, CAPILLARY      Component Value Range   Glucose-Capillary 132 (*) 70 - 99 mg/dL   Comment 1 Documented in Chart     Comment 2 Notify RN    GLUCOSE, CAPILLARY      Component Value Range   Glucose-Capillary 149 (*) 70 - 99 mg/dL  COMPREHENSIVE METABOLIC PANEL      Component Value Range   Sodium 133 (*) 135 - 145 mEq/L   Potassium 3.8  3.5 - 5.1 mEq/L   Chloride 96  96 - 112 mEq/L   CO2 28  19 - 32 mEq/L   Glucose, Bld 163 (*) 70 - 99 mg/dL   BUN 41 (*) 6 - 23 mg/dL   Creatinine, Ser 0.86 (*) 0.50 - 1.35 mg/dL   Calcium 8.9  8.4 - 57.8 mg/dL   Total Protein 7.1  6.0 - 8.3 g/dL   Albumin 2.5 (*) 3.5 - 5.2 g/dL   AST 26  0 - 37 U/L   ALT 10  0 - 53 U/L   Alkaline Phosphatase 88   39 - 117 U/L   Total Bilirubin 0.4  0.3 - 1.2 mg/dL   GFR calc non Af Amer 44 (*) >90 mL/min   GFR calc Af Amer 51 (*) >90 mL/min  CBC      Component Value Range   WBC 10.3  4.0 - 10.5 K/uL   RBC 3.91 (*) 4.22 - 5.81 MIL/uL   Hemoglobin 10.0 (*) 13.0 - 17.0 g/dL   HCT 46.9 (*) 62.9 - 52.8 %   MCV 83.1  78.0 - 100.0 fL   MCH 25.6 (*) 26.0 - 34.0 pg   MCHC 30.8  30.0 - 36.0 g/dL   RDW 41.3 (*) 24.4 - 01.0 %   Platelets 173  150 - 400 K/uL  GLUCOSE, CAPILLARY      Component Value Range   Glucose-Capillary 152 (*) 70 - 99 mg/dL  HEPARIN LEVEL (UNFRACTIONATED)      Component Value Range   Heparin Unfractionated 0.39  0.30 - 0.70 IU/mL  GLUCOSE, CAPILLARY      Component Value Range   Glucose-Capillary 127 (*) 70 - 99 mg/dL   Comment 1 Documented in Chart     Comment 2 Notify RN    PROTIME-INR      Component Value Range   Prothrombin Time 14.7  11.6 - 15.2 seconds   INR 1.17  0.00 - 1.49  GLUCOSE, CAPILLARY      Component Value Range   Glucose-Capillary 157 (*) 70 - 99 mg/dL   Comment 1 Documented in Chart     Comment 2 Notify RN    CBC      Component Value Range   WBC 9.0  4.0 - 10.5 K/uL   RBC 3.82 (*) 4.22 - 5.81 MIL/uL   Hemoglobin 9.2 (*) 13.0 - 17.0 g/dL   HCT 13.0 (*) 86.5 - 78.4 %   MCV 83.0  78.0 - 100.0 fL   MCH 24.1 (*) 26.0 - 34.0 pg   MCHC 29.0 (*) 30.0 - 36.0 g/dL   RDW 69.6 (*) 29.5 - 28.4 %   Platelets 177  150 - 400 K/uL  BASIC METABOLIC PANEL      Component Value Range   Sodium 134 (*) 135 - 145 mEq/L   Potassium 4.0  3.5 - 5.1 mEq/L   Chloride 95 (*) 96 - 112 mEq/L   CO2 29  19 - 32 mEq/L   Glucose, Bld 123 (*) 70 - 99 mg/dL   BUN 40 (*) 6 - 23 mg/dL   Creatinine, Ser 1.32 (*) 0.50 - 1.35 mg/dL   Calcium 9.0  8.4 - 44.0 mg/dL   GFR calc non Af Amer 43 (*) >90 mL/min   GFR calc Af Amer 49 (*) >90 mL/min  PROTIME-INR      Component Value Range   Prothrombin Time 14.5  11.6 - 15.2 seconds   INR 1.15  0.00 - 1.49  GLUCOSE, CAPILLARY       Component Value Range   Glucose-Capillary 189 (*) 70 - 99 mg/dL   Comment 1 Notify RN    GLUCOSE, CAPILLARY      Component Value Range   Glucose-Capillary 147 (*) 70 - 99 mg/dL  GLUCOSE, CAPILLARY      Component Value Range   Glucose-Capillary 123 (*) 70 - 99 mg/dL  GLUCOSE, CAPILLARY      Component Value Range   Glucose-Capillary 207 (*) 70 - 99 mg/dL  CBC      Component Value Range   WBC 7.3  4.0 - 10.5 K/uL   RBC 4.02 (*) 4.22 - 5.81 MIL/uL   Hemoglobin 10.1 (*) 13.0 - 17.0 g/dL   HCT 10.2 (*) 72.5 - 36.6 %   MCV 81.1  78.0 - 100.0 fL   MCH 25.1 (*) 26.0 - 34.0 pg   MCHC 31.0  30.0 - 36.0 g/dL   RDW 44.0 (*) 34.7 - 42.5 %   Platelets 195  150 - 400 K/uL  BASIC METABOLIC PANEL      Component Value Range   Sodium 135  135 - 145 mEq/L   Potassium 4.0  3.5 - 5.1 mEq/L   Chloride 96  96 - 112 mEq/L   CO2 31  19 -  32 mEq/L   Glucose, Bld 115 (*) 70 - 99 mg/dL   BUN 34 (*) 6 - 23 mg/dL   Creatinine, Ser 6.96 (*) 0.50 - 1.35 mg/dL   Calcium 9.0  8.4 - 29.5 mg/dL   GFR calc non Af Amer 52 (*) >90 mL/min   GFR calc Af Amer 61 (*) >90 mL/min  PROTIME-INR      Component Value Range   Prothrombin Time 15.1  11.6 - 15.2 seconds   INR 1.21  0.00 - 1.49  GLUCOSE, CAPILLARY      Component Value Range   Glucose-Capillary 120 (*) 70 - 99 mg/dL  GLUCOSE, CAPILLARY      Component Value Range   Glucose-Capillary 162 (*) 70 - 99 mg/dL  GLUCOSE, CAPILLARY      Component Value Range   Glucose-Capillary 127 (*) 70 - 99 mg/dL  GLUCOSE, CAPILLARY      Component Value Range   Glucose-Capillary 156 (*) 70 - 99 mg/dL  GLUCOSE, CAPILLARY      Component Value Range   Glucose-Capillary 142 (*) 70 - 99 mg/dL   Comment 1 Documented in Chart     Comment 2 Notify RN    CBC      Component Value Range   WBC 7.8  4.0 - 10.5 K/uL   RBC 4.01 (*) 4.22 - 5.81 MIL/uL   Hemoglobin 10.0 (*) 13.0 - 17.0 g/dL   HCT 28.4 (*) 13.2 - 44.0 %   MCV 82.3  78.0 - 100.0 fL   MCH 24.9 (*) 26.0 - 34.0 pg    MCHC 30.3  30.0 - 36.0 g/dL   RDW 10.2 (*) 72.5 - 36.6 %   Platelets 190  150 - 400 K/uL  BASIC METABOLIC PANEL      Component Value Range   Sodium 136  135 - 145 mEq/L   Potassium 3.7  3.5 - 5.1 mEq/L   Chloride 95 (*) 96 - 112 mEq/L   CO2 35 (*) 19 - 32 mEq/L   Glucose, Bld 120 (*) 70 - 99 mg/dL   BUN 31 (*) 6 - 23 mg/dL   Creatinine, Ser 4.40  0.50 - 1.35 mg/dL   Calcium 9.1  8.4 - 34.7 mg/dL   GFR calc non Af Amer 56 (*) >90 mL/min   GFR calc Af Amer 65 (*) >90 mL/min  PROTIME-INR      Component Value Range   Prothrombin Time 14.4  11.6 - 15.2 seconds   INR 1.14  0.00 - 1.49  PRO B NATRIURETIC PEPTIDE      Component Value Range   Pro B Natriuretic peptide (BNP) 5872.0 (*) 0 - 125 pg/mL  GLUCOSE, CAPILLARY      Component Value Range   Glucose-Capillary 191 (*) 70 - 99 mg/dL  GLUCOSE, CAPILLARY      Component Value Range   Glucose-Capillary 118 (*) 70 - 99 mg/dL   Comment 1 Documented in Chart     Comment 2 Notify RN      Discharge Medications:     Medication List     As of 08/22/2012 11:35 AM    STOP taking these medications         losartan-hydrochlorothiazide 100-25 MG per tablet   Commonly known as: HYZAAR      METFORMIN HCL PO      oxyCODONE 40 MG 12 hr tablet   Commonly known as: OXYCONTIN      oxycodone 5 MG capsule   Commonly known  as: OXY-IR      TAKE these medications         albuterol (5 MG/ML) 0.5% nebulizer solution   Commonly known as: PROVENTIL   Take 0.5 mLs (2.5 mg total) by nebulization 2 (two) times daily. And q4hours PRN for wheezing and SOB      aspirin EC 325 MG tablet   Take 1 tablet (325 mg total) by mouth 2 (two) times daily after a meal.      bisacodyl 5 MG EC tablet   Commonly known as: DULCOLAX   Take 1 tablet (5 mg total) by mouth daily as needed.      DSS 100 MG Caps   Take 100 mg by mouth 2 (two) times daily.      furosemide 40 MG tablet   Commonly known as: LASIX   Take 1 tablet (40 mg total) by mouth daily.       insulin aspart 100 UNIT/ML injection   Commonly known as: novoLOG   Inject 0-15 Units into the skin 3 (three) times daily with meals. CBG < 70: Drink juice; CBG 70 - 120: 0 units: CBG 121 - 150: 2 units; CBG 151 - 200: 3 units; CBG 201 - 250: 5 units; CBG 251 - 300: 8 units;CBG 301 - 350: 11 units; CBG 351 - 400: 15 units; CBG > 400 : 15 units and notify MD      isosorbide mononitrate 60 MG 24 hr tablet   Commonly known as: IMDUR   Take 1 tablet (60 mg total) by mouth daily.      methocarbamol 500 MG tablet   Commonly known as: ROBAXIN   Take 1 tablet (500 mg total) by mouth every 6 (six) hours as needed.      metoprolol 50 MG tablet   Commonly known as: LOPRESSOR   Take 2 tablets (100 mg total) by mouth 2 (two) times daily.      oxyCODONE 5 MG immediate release tablet   Commonly known as: Oxy IR/ROXICODONE   Take 1-2 tablets (5-10 mg total) by mouth every 3 (three) hours as needed for pain.      OxyCODONE 40 mg T12a   Commonly known as: OXYCONTIN   Take 1 tablet (40 mg total) by mouth every 12 (twelve) hours.      polyethylene glycol packet   Commonly known as: MIRALAX / GLYCOLAX   Take 17 g by mouth daily as needed.      tiotropium 18 MCG inhalation capsule   Commonly known as: SPIRIVA   Place 1 capsule (18 mcg total) into inhaler and inhale daily.          Diagnostic Studies: Dg Chest 1 View  08/17/2012  *RADIOLOGY REPORT*  Clinical Data: Shortness of breath postoperatively.  PORTABLE CHEST - 1 VIEW 08/17/2012 0656 hours:  Comparison: Portable chest x-ray yesterday.  Findings: Cardiac silhouette moderately enlarged but stable. Pulmonary venous hypertension and moderate interstitial pulmonary edema, slightly increased since the examination yesterday.  This is superimposed upon baseline chronic lung disease.  No confluent airspace consolidation.  Probable small right pleural effusion.  IMPRESSION: Slight worsening of interstitial pulmonary edema since yesterday, consistent with  CHF and/or fluid overload.   Original Report Authenticated By: Hulan Saas, M.D.    Dg Tibia/fibula Right  08/17/2012  *RADIOLOGY REPORT*  Clinical Data: ORIF tib-fib fracture  RIGHT TIBIA AND FIBULA - 2 VIEW  Comparison: Preoperative radiographs 08/16/2012  Findings: Multiple intraoperative spot radiographs obtained during ORIF of distal tibia  fracture with intramedullary rod with to proximal and two distal locking screws.  Alignment of the fracture fragments appears improved compared to the preoperative radiographs.  No evidence of immediate hardware complication.  IMPRESSION: ORIF of distal tibia fracture without evidence of immediate complication as above.   Original Report Authenticated By: Malachy Moan, M.D.    Dg Tibia/fibula Right  08/16/2012  *RADIOLOGY REPORT*  Clinical Data: Larey Seat out of a wheelchair and injured the right lower leg and ankle.  RIGHT TIBIA AND FIBULA - 2 VIEW  Comparison: Right ankle x-rays obtained concurrently.  Findings: Oblique comminuted fracture involving the distal tibial metaphysis.  Nondisplaced fractures extending more inferiorly in the metaphysis, though these do not extend to the articular surface.  No fractures elsewhere involving the tibia.  Nondisplaced fracture involving the proximal fibular metaphysis along its medial cortical surface.  Chondrocalcinosis in the medial meniscus of the knee.  Mild medial compartment joint space narrowing.  IMPRESSION:  1.  Oblique, comminuted fracture involving the distal tibial metaphysis with nondisplaced fractures more distally in the metaphysis that do not extend to the articular surface. 2.  Nondisplaced fracture involving the medial cortex of the proximal fibular metaphysis. 3.  CPPD arthropathy involving the knee.   Original Report Authenticated By: Hulan Saas, M.D.    Dg Ankle Complete Right  08/16/2012  *RADIOLOGY REPORT*  Clinical Data: Larey Seat out of a wheelchair and injured the right lower leg and ankle.   RIGHT ANKLE - COMPLETE 3+ VIEW  Comparison: Right tibia-fibula x-rays obtained concurrently.  Findings: Comminuted oblique fracture involving the distal tibial metaphysis, with a vertically oriented linear fracture extending more distally as well as a nondisplaced oblique fracture in the opposite direction more distally.  The fracture line does not appear to involve the articular surface.  No fractures about the ankle joint proper.  Ankle mortise intact.  Calcification in the soft tissues posterior to the ankle joint which are likely vascular.  IMPRESSION: Oblique, comminuted fracture involving the distal tibial metaphysis with nondisplaced fractures more distally in the metaphysis that do not extend to the articular surface. No other fractures.   Original Report Authenticated By: Hulan Saas, M.D.    Ct Angio Chest Pe W/cm &/or Wo Cm  08/18/2012  *RADIOLOGY REPORT*  Clinical Data: Pulmonary arterial hypertension of unknown etiology. Evaluate for pulmonary embolus.  CT ANGIOGRAPHY CHEST  Technique:  Multidetector CT imaging of the chest using the standard protocol during bolus administration of intravenous contrast. Multiplanar reconstructed images including MIPs were obtained and reviewed to evaluate the vascular anatomy.  Contrast: OMNIPAQUE IOHEXOL 350 MG/ML SOLN  Comparison: 10/18/2010  Findings: Satisfactory opacification of the pulmonary arteries noted, and there is no evidence of pulmonary emboli.  No evidence of thoracic aortic aneurysm or dissection.  Mild to moderate cardiomegaly is noted.  Diffuse interstitial infiltrates are also seen bilaterally, most likely due to interstitial edema.  Small right pleural effusion and right lower lobe atelectasis also seen, and consistent with congestive heart failure.  Shotty bilateral hilar and mediastinal lymph nodes are seen measuring 1-2 cm in short axis.  These are nonspecific and most likely due to lymphedema or reactive in etiology.  No central  endobronchial lesion identified.  IMPRESSION:  1.  No evidence of pulmonary embolism. 2.  Congestive heart failure with small right pleural effusion. 3.  Shotty mediastinal and bilateral hilar lymph nodes, most likely secondary to lymphedema or reactive in etiology.   Original Report Authenticated By: Myles Rosenthal, M.D.  Dg Chest Port 1 View  08/16/2012  *RADIOLOGY REPORT*  Clinical Data: Preoperative respiratory exam for distal tibial fracture on the right.  PORTABLE CHEST - 1 VIEW  Comparison: 03/27/2010  Findings: Moderate interstitial pulmonary edema present superimposed on chronic lung disease.  Stable cardiomegaly.  No large pleural effusions.  IMPRESSION: Moderate interstitial edema and cardiomegaly.   Original Report Authenticated By: Irish Lack, M.D.    Dg C-arm 1-60 Min-no Report  08/17/2012  CLINICAL DATA: tib fib fracture   C-ARM 1-60 MINUTES  Fluoroscopy was utilized by the requesting physician.  No radiographic  interpretation.      Disposition: 01-Home or Self Care  Discharge Orders    Future Appointments: Provider: Department: Dept Phone: Center:   09/15/2012 9:30 AM Oretha Milch, MD [facility] [phone] None     Future Orders Please Complete By Expires   Diet - low sodium heart healthy      Diet Carb Modified      Non weight bearing      Scheduling Instructions:   the right leg non weight bearing for 6-8 weeks May full weight bear on the left leg   Call MD / Call 911      Comments:   If you experience chest pain or shortness of breath, CALL 911 and be transported to the hospital emergency room.  If you develope a fever above 101 F, pus (white drainage) or increased drainage or redness at the wound, or calf pain, call your surgeon's office.   Constipation Prevention      Comments:   Drink plenty of fluids.  Prune juice may be helpful.  You may use a stool softener, such as Colace (over the counter) 100 mg twice a day.  Use MiraLax (over the counter) for  constipation as needed.   Increase activity slowly as tolerated      Discharge instructions      Comments:   Keep short leg splint and dressing dry. May use water impervious bag or cast bag and tub chair to shower Tape the top of bag to skin to avoid moisture soaking the dressing on the leg. Call if there is odor or saturation of dressing or worsening pain not controlled with medications. Call if fever greater than 101.5. Use crutches or walker no weight bearing on the ankle fracture leg. Please follow up with an appointment with Dr. Otelia Sergeant  2 weeks from the time of surgery. Elevate as often as possible during the first week after surgery gradually increasing the time the leg is dependent or down there after. If swelling recurrs then elevate again. Wheel chair for longer distances. Take aspirin two times daily.   Driving restrictions      Comments:   No driving.   Lifting restrictions      Comments:   No lifting .      Follow-up Information    Follow up with TILLEY JR,W SPENCER, MD. In 3 months.   Contact information:   [address] [place] [address] [phone]       Follow up with Oretha Milch., MD. On 09/15/2012. (at 9:30 am)    Contact information:   [address] [place] [address] [phone]       Follow up with NITKA,JAMES E, MD. In 2 weeks.   Contact information:   [address] [place] [address] [phone]       Follow up with Sandrea Hughs, MD. In 3 weeks.  Contact information:   [address] [place] [address] [phone]       Follow up with Sebastian Ache, MD. Schedule an appointment as soon as possible for a visit in 6 months.   Contact information:   [address] [place] [address] [phone]           Signed: Kerrin Champagne 08/22/2012, 11:35 AM

## 2012-08-22 NOTE — Progress Notes (Signed)
5 Days Post-Op  Subjective:  1 - Urethral Stricture  - Day 5 s/p bedside cysto and balloon dilation of high-grade distal penile urethral stricture.   Foley out this AM and now voided w/o sensation of PVR x2. Going to rehab today.  Objective: Vital signs in last 24 hours: Temp:  [97.6 F (36.4 C)-98.2 F (36.8 C)] 98 F (36.7 C) (12/06 0603) Pulse Rate:  [76-93] 93  (12/06 0900) Resp:  [16-20] 20  (12/06 0603) BP: (153-177)/(75-85) 174/83 mmHg (12/06 0900) SpO2:  [87 %-94 %] 87 % (12/06 0815) Last BM Date: 08/17/12  Intake/Output from previous day: 12/05 0701 - 12/06 0700 In: 1420 [P.O.:1370; IV Piggyback:50] Out: 3625 [Urine:3625] Intake/Output this shift: Total I/O In: -  Out: 250 [Urine:250]  General appearance: alert, cooperative and family at bedside Head: Normocephalic, without obvious abnormality, atraumatic Eyes: conjunctivae/corneas clear. PERRL, EOM's intact. Fundi benign., wearing glasses and Leslie O2 Ears: normal TM's and external ear canals both ears Back: symmetric, no curvature. ROM normal. No CVA tenderness. Resp: clear to auscultation bilaterally Cardio: regular rate and rhythm, S1, S2 normal, no murmur, click, rub or gallop GI: soft, non-tender; bowel sounds normal; no masses,  no organomegaly Male genitalia: normal, foreskin down. Foley out. Pulses: 2+ and symmetric Skin: Skin color, texture, turgor normal. No rashes or lesions Lymph nodes: Cervical, supraclavicular, and axillary nodes normal. Neurologic: Grossly normal  Lab Results:   [facility] 08/22/12 0454 08/21/12 0506  WBC 7.8 7.3  HGB 10.0* 10.1*  HCT 33.0* 32.6*  PLT 190 195   BMET  Basename 08/22/12 0454 08/21/12 0506  NA 136 135  K 3.7 4.0  CL 95* 96  CO2 35* 31  GLUCOSE 120* 115*  BUN 31* 34*  CREATININE 1.29 1.36*  CALCIUM 9.1 9.0   PT/INR  Basename 08/22/12 0454 08/21/12 0506  LABPROT 14.4 15.1  INR 1.14 1.21   ABG No results found for this basename:  PHART:2,PCO2:2,PO2:2,HCO3:2 in the last 72 hours  Studies/Results: No results found.  Anti-infectives: Anti-infectives     Start     Dose/Rate Route Frequency Ordered Stop   08/19/12 1200   cefTRIAXone (ROCEPHIN) 1 g in dextrose 5 % 50 mL IVPB        1 g 100 mL/hr over 30 Minutes Intravenous Every 24 hours 08/19/12 1102     08/17/12 0901   ceFAZolin (ANCEF) 3 g in dextrose 5 % 50 mL IVPB        3 g 160 mL/hr over 30 Minutes Intravenous 60 min pre-op 08/17/12 0901 08/17/12 1119   08/17/12 0630   ceFAZolin (ANCEF) IVPB 2 g/50 mL premix        2 g 100 mL/hr over 30 Minutes Intravenous Every 6 hours 08/17/12 0322 08/17/12 1816          Assessment/Plan:  1 - Urethral Stricture  - Doing well voiding on own after dilation. Warned about symptoms of stricture recurrence and need for prompt eval if occurs. Otherwise I will arrange outpatient f/u in 6mos.   [facility], [place] 08/22/2012

## 2012-09-15 ENCOUNTER — Institutional Professional Consult (permissible substitution): Payer: 59 | Admitting: Pulmonary Disease

## 2012-09-27 ENCOUNTER — Encounter (HOSPITAL_COMMUNITY): Payer: Self-pay | Admitting: Emergency Medicine

## 2012-09-27 ENCOUNTER — Emergency Department (HOSPITAL_COMMUNITY)
Admission: EM | Admit: 2012-09-27 | Discharge: 2012-09-27 | Disposition: A | Discharge status: Home / Self Care | Payer: 59 | Attending: Emergency Medicine | Admitting: Emergency Medicine

## 2012-09-27 DIAGNOSIS — M51379 Other intervertebral disc degeneration, lumbosacral region without mention of lumbar back pain or lower extremity pain: Secondary | ICD-10-CM | POA: Insufficient documentation

## 2012-09-27 DIAGNOSIS — Z794 Long term (current) use of insulin: Secondary | ICD-10-CM | POA: Insufficient documentation

## 2012-09-27 DIAGNOSIS — Z79899 Other long term (current) drug therapy: Secondary | ICD-10-CM | POA: Insufficient documentation

## 2012-09-27 DIAGNOSIS — E669 Obesity, unspecified: Secondary | ICD-10-CM | POA: Insufficient documentation

## 2012-09-27 DIAGNOSIS — J449 Chronic obstructive pulmonary disease, unspecified: Secondary | ICD-10-CM | POA: Insufficient documentation

## 2012-09-27 DIAGNOSIS — I509 Heart failure, unspecified: Secondary | ICD-10-CM | POA: Insufficient documentation

## 2012-09-27 DIAGNOSIS — J4489 Other specified chronic obstructive pulmonary disease: Secondary | ICD-10-CM | POA: Insufficient documentation

## 2012-09-27 DIAGNOSIS — Z7982 Long term (current) use of aspirin: Secondary | ICD-10-CM | POA: Insufficient documentation

## 2012-09-27 DIAGNOSIS — M5137 Other intervertebral disc degeneration, lumbosacral region: Secondary | ICD-10-CM | POA: Insufficient documentation

## 2012-09-27 DIAGNOSIS — R339 Retention of urine, unspecified: Secondary | ICD-10-CM

## 2012-09-27 DIAGNOSIS — F17200 Nicotine dependence, unspecified, uncomplicated: Secondary | ICD-10-CM | POA: Insufficient documentation

## 2012-09-27 DIAGNOSIS — I119 Hypertensive heart disease without heart failure: Secondary | ICD-10-CM | POA: Insufficient documentation

## 2012-09-27 DIAGNOSIS — I739 Peripheral vascular disease, unspecified: Secondary | ICD-10-CM | POA: Insufficient documentation

## 2012-09-27 DIAGNOSIS — E119 Type 2 diabetes mellitus without complications: Secondary | ICD-10-CM | POA: Insufficient documentation

## 2012-09-27 DIAGNOSIS — N35919 Unspecified urethral stricture, male, unspecified site: Secondary | ICD-10-CM

## 2012-09-27 LAB — URINALYSIS, MICROSCOPIC ONLY
Bilirubin Urine: NEGATIVE
Glucose, UA: NEGATIVE mg/dL
Ketones, ur: NEGATIVE mg/dL
Protein, ur: NEGATIVE mg/dL
pH: 5 (ref 5.0–8.0)

## 2012-09-27 MED ORDER — OXYCODONE-ACETAMINOPHEN 5-325 MG PO TABS
1.0000 | ORAL_TABLET | Freq: Once | ORAL | Status: AC
  Administered 2012-09-27: 1 via ORAL
  Filled 2012-09-27: qty 1

## 2012-09-27 MED ORDER — LIDOCAINE HCL 2 % EX GEL
CUTANEOUS | Status: AC
  Administered 2012-09-27: 18:00:00
  Filled 2012-09-27: qty 10

## 2012-09-27 MED ORDER — HYDROCODONE-ACETAMINOPHEN 5-325 MG PO TABS
2.0000 | ORAL_TABLET | Freq: Once | ORAL | Status: AC
  Administered 2012-09-27: 2 via ORAL
  Filled 2012-09-27: qty 2

## 2012-09-27 NOTE — ED Notes (Signed)
Pt from home.  EMS reports pt has been unable to urinate for 3 days.  Pt stated this had happened 3x before.  Hx of suprapubic cath.  Pt stated he urinated 4 oz today.  Pt reports edema in feet, legs and hands.  Bladder scan .  Attempted to place cath 2x, but unable to retract foreskin enough to advance catheter.

## 2012-09-27 NOTE — ED Provider Notes (Signed)
History     CSN: [id number]  Arrival date & time 09/27/12  1027   First MD Initiated Contact with Patient 09/27/12 1746      Chief Complaint  Patient presents with  . Dysuria    (Consider location/radiation/quality/duration/timing/severity/associated sxs/prior treatment) HPI Comments: Terry [name] is a 67 y.o. male with a history of CHF, COPD, type 2 diabetes and peripheral vascular disease presents to the emergency department complaining of urinary retention.  Onset of symptoms began 3 days ago and have been gradually worsening.  Patient reports he has had history of this before however he is only needed a catheter one previous time while hospitalized.  Patient states he has never been able to retract his foreskin, however normally he is able to urinate without difficulty.  Patient has no other complaints at this time.  Note patient is on oxygen at home at baseline (2 L nasal cannula).     The history is provided by the patient.    Past Medical History  Diagnosis Date  . Diabetes mellitus without complication   . Peripheral vascular disease   . Hypertensive heart disease   . Obesity (BMI 30-39.9)   . Lumbar disc disease     Past Surgical History  Procedure Date  . Back surgery     numerous spine surgeries, 1966-2011  . Hip replacement     bilateral, each replaced twice  . Tonsillectomy   . Tibia im nail insertion 08/17/2012    Procedure: INTRAMEDULLARY (IM) NAIL TIBIAL;  Surgeon: Kerrin Champagne, MD;  Location: [facility];  Service: Orthopedics;  Laterality: Right;    No family history on file.  History  Substance Use Topics  . Smoking status: Current Every Day Smoker -- 1.0 packs/day for 50 years    Types: Cigarettes  . Smokeless tobacco: Never Used  . Alcohol Use: No     Comment: rarely      Review of Systems  Constitutional: Negative for fever, diaphoresis and activity change.  HENT: Negative for congestion and neck pain.   Respiratory: Negative for cough.     Genitourinary: Positive for difficulty urinating. Negative for dysuria, urgency, frequency, flank pain, penile swelling, scrotal swelling, enuresis, genital sores, penile pain and testicular pain.  Musculoskeletal: Negative for myalgias.  Skin: Negative for color change and wound.  Neurological: Negative for headaches.  All other systems reviewed and are negative.    Allergies  Review of patient's allergies indicates no known allergies.  Home Medications   Current Outpatient Rx  Name  Route  Sig  Dispense  Refill  . ALBUTEROL SULFATE (5 MG/ML) 0.5% IN NEBU   Nebulization   Take 0.5 mLs (2.5 mg total) by nebulization 2 (two) times daily. And q4hours PRN for wheezing and SOB   20 mL      . ASPIRIN EC 325 MG PO TBEC   Oral   Take 1 tablet (325 mg total) by mouth 2 (two) times daily after a meal.   100 tablet   3   . BISACODYL 5 MG PO TBEC   Oral   Take 1 tablet (5 mg total) by mouth daily as needed.   30 tablet   1   . FUROSEMIDE 40 MG PO TABS   Oral   Take 1 tablet (40 mg total) by mouth daily.   30 tablet      . HYDROCODONE-ACETAMINOPHEN 7.5-325 MG PO TABS   Oral   Take 1 tablet by mouth every 6 (six) hours  as needed. Pain         . INSULIN ASPART 100 UNIT/ML [place] SOLN   Subcutaneous   Inject 0-15 Units into the skin 3 (three) times daily with meals. CBG < 70: Drink juice; CBG 70 - 120: 0 units: CBG 121 - 150: 2 units; CBG 151 - 200: 3 units; CBG 201 - 250: 5 units; CBG 251 - 300: 8 units;CBG 301 - 350: 11 units; CBG 351 - 400: 15 units; CBG > 400 : 15 units and notify MD   1 vial      . METFORMIN HCL ER (OSM) 500 MG PO TB24   Oral   Take 500 mg by mouth 3 (three) times daily.         Marland Kitchen METHOCARBAMOL 500 MG PO TABS   Oral   Take 1 tablet (500 mg total) by mouth every 6 (six) hours as needed.   60 tablet   2   . METOPROLOL TARTRATE 50 MG PO TABS   Oral   Take 2 tablets (100 mg total) by mouth 2 (two) times daily.         . OXYCODONE HCL ER 40 MG PO  T12A   Oral   Take 1 tablet (40 mg total) by mouth every 12 (twelve) hours.   60 tablet   0   . POLYETHYLENE GLYCOL 3350 PO PACK   Oral   Take 17 g by mouth daily as needed.   14 each   0   . TIOTROPIUM BROMIDE MONOHYDRATE 18 MCG IN CAPS   Inhalation   Place 1 capsule (18 mcg total) into inhaler and inhale daily.   30 capsule        BP 208/98  Pulse 93  Temp 97.4 F (36.3 C) (Oral)  Resp 18  SpO2 88%  Physical Exam  Nursing note and vitals reviewed. Constitutional: He is oriented to person, place, and time. He appears well-developed and well-nourished. No distress.  HENT:  Head: Normocephalic and atraumatic.  Eyes: Conjunctivae normal and EOM are normal.  Neck: Normal range of motion.  Pulmonary/Chest: Effort normal.       Increased effort, however pt reports he   Genitourinary:       Exam chaperoned. Foreskin retractable, however unable to see open urethra. Urine continuously drizzling. Non tender.   Musculoskeletal: Normal range of motion.  Neurological: He is alert and oriented to person, place, and time.  Skin: Skin is warm and dry. No rash noted. He is not diaphoretic.  Psychiatric: He has a normal mood and affect. His behavior is normal.    ED Course  Procedures (including critical care time)  Labs Reviewed  URINALYSIS, MICROSCOPIC ONLY - Abnormal; Notable for the following:    APPearance CLOUDY (*)     Hgb urine dipstick LARGE (*)     Leukocytes, UA SMALL (*)     Bacteria, UA FEW (*)     All other components within normal limits   No results found.   No diagnosis found.  Nursing failed foley attempt x2, I as well failed. Consult to Urology who will see pt in ER ans attempt correction of Urethral stricture.   MDM  Urinary retention & Urethral stricture  Pt to be seen by urology in the ER. Care resumed by Dr. Denton Lank with anticipation for likely dc with OP follow up. Pt is without any other complaints at this time.         Jaci Carrel,  [place] 09/29/12 2004

## 2012-09-27 NOTE — ED Notes (Signed)
WRU:EAVW<UJ> Expected date:09/27/12<BR> Expected time: 5:09 PM<BR> Means of arrival:Ambulance<BR> Comments:<BR> Unable to urinate

## 2012-09-27 NOTE — ED Notes (Signed)
Bedside report received from previous RN 

## 2012-09-27 NOTE — ED Notes (Signed)
Urology just left bedside. Urinary Cath placed.

## 2012-09-29 NOTE — Consult Note (Signed)
Urology Consult  Referring physician: Jaci Carrel, PA-C Reason for referral: Catheter insertion  Chief Complaint: Difficulty voiding.  History of Present Illness: Terry [name] is a 67 years old male who presented to the ER this afternoon complaining of difficulty voiding, suprapubic pain.  He was hospitalized about 6 weeks ago for leg fracture and  had urethral dilation and catheter insertion by Dr Berneice Heinrich.  This afternoon he started having difficulty voiding: frequency, suprapubic pain, voiding small amount of urine at at a time.  The nurses were unable to insert a catheter in his bladder.  He has severe phimosis.  It is difficult to retract the foreskin to identify the meatus.  After several attempts I was able to pass a catheter through the meatus but the catheter could not be advanced through the penile urethra.  I then passed a sensor wire through the meatus in the bladder.  I dilated the penile urethra and then passed a # 16 Fr Councill catheter over the sensor wire in the bladder with some difficulty.  Clear urine was then drained out of the bladder.  The catheter was left to straight drainage.  Past Medical History  Diagnosis Date  . Diabetes mellitus without complication   . Peripheral vascular disease   . Hypertensive heart disease   . Obesity (BMI 30-39.9)   . Lumbar disc disease    Past Surgical History  Procedure Date  . Back surgery     numerous spine surgeries, 1966-2011  . Hip replacement     bilateral, each replaced twice  . Tonsillectomy   . Tibia im nail insertion 08/17/2012    Procedure: INTRAMEDULLARY (IM) NAIL TIBIAL;  Surgeon: Kerrin Champagne, MD;  Location: [facility];  Service: Orthopedics;  Laterality: Right;    Medications: Proventil, Aspirin,Lasix,Insulin,Metoprolol, Robaxin, Metformin, Spiriva Allergies: No Known Allergies  History reviewed. No pertinent family history. Social History:  reports that he has been smoking Cigarettes.  He has a 50 pack-year smoking history.  He has never used smokeless tobacco. He reports that he does not drink alcohol or use illicit drugs.  ROS: All systems are reviewed and negative except as noted.   Physical Exam:  Vital signs in last 24 hours:    Cardiovascular: Skin warm; not flushed Respiratory: Breaths quiet; no shortness of breath Abdomen: No masses Neurological: Normal sensation to touch Musculoskeletal: Normal motor function arms and legs Lymphatics: No inguinal adenopathy Skin: No rashes Genitourinary:Penis is uncircumcised and there is severe phimosis, making it difficult to visualize the glans penis.  Scrotum is unremarkable.   Rectal: deferred  Laboratory Data:  Results for orders placed during the hospital encounter of 09/27/12 (from the past 72 hour(s))  URINALYSIS, MICROSCOPIC ONLY     Status: Abnormal   Collection Time   09/27/12  5:51 PM      Component Value Range Comment   Color, Urine YELLOW  YELLOW    APPearance CLOUDY (*) CLEAR    Specific Gravity, Urine 1.011  1.005 - 1.030    pH 5.0  5.0 - 8.0    Glucose, UA NEGATIVE  NEGATIVE mg/dL    Hgb urine dipstick LARGE (*) NEGATIVE    Bilirubin Urine NEGATIVE  NEGATIVE    Ketones, ur NEGATIVE  NEGATIVE mg/dL    Protein, ur NEGATIVE  NEGATIVE mg/dL    Urobilinogen, UA 1.0  0.0 - 1.0 mg/dL    Nitrite NEGATIVE  NEGATIVE    Leukocytes, UA SMALL (*) NEGATIVE    WBC, UA 0-2  <  3 WBC/hpf    RBC / HPF 7-10  <3 RBC/hpf    Bacteria, UA FEW (*) RARE       Impression/Assessment:  Urinary retention.  Urethral stricture  Plan:  Leave Foley indwelling.  Follow-up with Dr Berneice Heinrich  Terry [name] 09/29/2012, 6:59 PM

## 2012-09-30 NOTE — ED Provider Notes (Signed)
Medical screening examination/treatment/procedure(s) were conducted as a shared visit with non-physician practitioner(s) and myself.  I personally evaluated the patient during the encounter Pt w urine retention. Resolved s/p cath placement by urology. abd soft nt.   Suzi Roots, MD 09/30/12 [phone]

## 2012-10-07 ENCOUNTER — Emergency Department (HOSPITAL_COMMUNITY): Payer: 59

## 2012-10-07 ENCOUNTER — Encounter (HOSPITAL_COMMUNITY): Payer: Self-pay

## 2012-10-07 ENCOUNTER — Inpatient Hospital Stay (HOSPITAL_COMMUNITY)
Admission: EM | Admit: 2012-10-07 | Discharge: 2012-11-15 | DRG: 393 | Disposition: E | Payer: 59 | Attending: Internal Medicine | Admitting: Internal Medicine

## 2012-10-07 DIAGNOSIS — R58 Hemorrhage, not elsewhere classified: Secondary | ICD-10-CM | POA: Diagnosis present

## 2012-10-07 DIAGNOSIS — Z794 Long term (current) use of insulin: Secondary | ICD-10-CM

## 2012-10-07 DIAGNOSIS — I279 Pulmonary heart disease, unspecified: Secondary | ICD-10-CM | POA: Diagnosis present

## 2012-10-07 DIAGNOSIS — I469 Cardiac arrest, cause unspecified: Secondary | ICD-10-CM | POA: Diagnosis not present

## 2012-10-07 DIAGNOSIS — I82A19 Acute embolism and thrombosis of unspecified axillary vein: Secondary | ICD-10-CM | POA: Diagnosis present

## 2012-10-07 DIAGNOSIS — R339 Retention of urine, unspecified: Secondary | ICD-10-CM

## 2012-10-07 DIAGNOSIS — E872 Acidosis, unspecified: Secondary | ICD-10-CM | POA: Diagnosis present

## 2012-10-07 DIAGNOSIS — J4489 Other specified chronic obstructive pulmonary disease: Secondary | ICD-10-CM | POA: Diagnosis present

## 2012-10-07 DIAGNOSIS — I509 Heart failure, unspecified: Secondary | ICD-10-CM

## 2012-10-07 DIAGNOSIS — E669 Obesity, unspecified: Secondary | ICD-10-CM

## 2012-10-07 DIAGNOSIS — S82301A Unspecified fracture of lower end of right tibia, initial encounter for closed fracture: Secondary | ICD-10-CM | POA: Diagnosis present

## 2012-10-07 DIAGNOSIS — J449 Chronic obstructive pulmonary disease, unspecified: Secondary | ICD-10-CM | POA: Diagnosis present

## 2012-10-07 DIAGNOSIS — IMO0002 Reserved for concepts with insufficient information to code with codable children: Secondary | ICD-10-CM

## 2012-10-07 DIAGNOSIS — K661 Hemoperitoneum: Principal | ICD-10-CM | POA: Diagnosis present

## 2012-10-07 DIAGNOSIS — E1151 Type 2 diabetes mellitus with diabetic peripheral angiopathy without gangrene: Secondary | ICD-10-CM | POA: Diagnosis present

## 2012-10-07 DIAGNOSIS — N179 Acute kidney failure, unspecified: Secondary | ICD-10-CM

## 2012-10-07 DIAGNOSIS — D62 Acute posthemorrhagic anemia: Secondary | ICD-10-CM | POA: Diagnosis not present

## 2012-10-07 DIAGNOSIS — G931 Anoxic brain damage, not elsewhere classified: Secondary | ICD-10-CM | POA: Diagnosis not present

## 2012-10-07 DIAGNOSIS — R0902 Hypoxemia: Secondary | ICD-10-CM | POA: Diagnosis present

## 2012-10-07 DIAGNOSIS — M109 Gout, unspecified: Secondary | ICD-10-CM

## 2012-10-07 DIAGNOSIS — I798 Other disorders of arteries, arterioles and capillaries in diseases classified elsewhere: Secondary | ICD-10-CM | POA: Diagnosis present

## 2012-10-07 DIAGNOSIS — R609 Edema, unspecified: Secondary | ICD-10-CM

## 2012-10-07 DIAGNOSIS — I471 Supraventricular tachycardia, unspecified: Secondary | ICD-10-CM | POA: Diagnosis present

## 2012-10-07 DIAGNOSIS — Z79899 Other long term (current) drug therapy: Secondary | ICD-10-CM

## 2012-10-07 DIAGNOSIS — K560 Paralytic ileus: Secondary | ICD-10-CM | POA: Diagnosis not present

## 2012-10-07 DIAGNOSIS — I5033 Acute on chronic diastolic (congestive) heart failure: Secondary | ICD-10-CM | POA: Diagnosis present

## 2012-10-07 DIAGNOSIS — N390 Urinary tract infection, site not specified: Secondary | ICD-10-CM

## 2012-10-07 DIAGNOSIS — N35919 Unspecified urethral stricture, male, unspecified site: Secondary | ICD-10-CM | POA: Diagnosis present

## 2012-10-07 DIAGNOSIS — R140 Abdominal distension (gaseous): Secondary | ICD-10-CM | POA: Diagnosis present

## 2012-10-07 DIAGNOSIS — F17200 Nicotine dependence, unspecified, uncomplicated: Secondary | ICD-10-CM | POA: Diagnosis present

## 2012-10-07 DIAGNOSIS — I2781 Cor pulmonale (chronic): Secondary | ICD-10-CM | POA: Diagnosis present

## 2012-10-07 DIAGNOSIS — I959 Hypotension, unspecified: Secondary | ICD-10-CM

## 2012-10-07 DIAGNOSIS — K5641 Fecal impaction: Secondary | ICD-10-CM | POA: Diagnosis present

## 2012-10-07 DIAGNOSIS — J9601 Acute respiratory failure with hypoxia: Secondary | ICD-10-CM | POA: Diagnosis present

## 2012-10-07 DIAGNOSIS — I119 Hypertensive heart disease without heart failure: Secondary | ICD-10-CM | POA: Diagnosis present

## 2012-10-07 DIAGNOSIS — M79606 Pain in leg, unspecified: Secondary | ICD-10-CM

## 2012-10-07 DIAGNOSIS — E1159 Type 2 diabetes mellitus with other circulatory complications: Secondary | ICD-10-CM | POA: Diagnosis present

## 2012-10-07 DIAGNOSIS — J9600 Acute respiratory failure, unspecified whether with hypoxia or hypercapnia: Secondary | ICD-10-CM | POA: Diagnosis present

## 2012-10-07 DIAGNOSIS — M519 Unspecified thoracic, thoracolumbar and lumbosacral intervertebral disc disorder: Secondary | ICD-10-CM | POA: Diagnosis present

## 2012-10-07 DIAGNOSIS — R601 Generalized edema: Secondary | ICD-10-CM

## 2012-10-07 DIAGNOSIS — G8929 Other chronic pain: Secondary | ICD-10-CM

## 2012-10-07 DIAGNOSIS — Z6834 Body mass index (BMI) 34.0-34.9, adult: Secondary | ICD-10-CM

## 2012-10-07 DIAGNOSIS — K683 Retroperitoneal hematoma: Secondary | ICD-10-CM | POA: Diagnosis present

## 2012-10-07 DIAGNOSIS — I5032 Chronic diastolic (congestive) heart failure: Secondary | ICD-10-CM

## 2012-10-07 DIAGNOSIS — R578 Other shock: Secondary | ICD-10-CM | POA: Diagnosis not present

## 2012-10-07 DIAGNOSIS — J811 Chronic pulmonary edema: Secondary | ICD-10-CM | POA: Diagnosis present

## 2012-10-07 DIAGNOSIS — M79609 Pain in unspecified limb: Secondary | ICD-10-CM | POA: Diagnosis present

## 2012-10-07 DIAGNOSIS — S8290XD Unspecified fracture of unspecified lower leg, subsequent encounter for closed fracture with routine healing: Secondary | ICD-10-CM

## 2012-10-07 DIAGNOSIS — I4719 Other supraventricular tachycardia: Secondary | ICD-10-CM

## 2012-10-07 DIAGNOSIS — D638 Anemia in other chronic diseases classified elsewhere: Secondary | ICD-10-CM

## 2012-10-07 HISTORY — DX: Angina pectoris, unspecified: I20.9

## 2012-10-07 LAB — GLUCOSE, CAPILLARY: Glucose-Capillary: 117 mg/dL — ABNORMAL HIGH (ref 70–99)

## 2012-10-07 LAB — BLOOD GAS, ARTERIAL
Acid-base deficit: 0.3 mmol/L (ref 0.0–2.0)
Drawn by: 244901
FIO2: 1 %
Patient temperature: 98.6
pCO2 arterial: 43.1 mmHg (ref 35.0–45.0)

## 2012-10-07 LAB — URINALYSIS, ROUTINE W REFLEX MICROSCOPIC
Bilirubin Urine: NEGATIVE
Glucose, UA: NEGATIVE mg/dL
Protein, ur: 100 mg/dL — AB
Specific Gravity, Urine: 1.021 (ref 1.005–1.030)
Urobilinogen, UA: 1 mg/dL (ref 0.0–1.0)

## 2012-10-07 LAB — URINE MICROSCOPIC-ADD ON

## 2012-10-07 LAB — CBC WITH DIFFERENTIAL/PLATELET
Basophils Absolute: 0 10*3/uL (ref 0.0–0.1)
Lymphocytes Relative: 11 % — ABNORMAL LOW (ref 12–46)
Neutro Abs: 6 10*3/uL (ref 1.7–7.7)
Neutrophils Relative %: 75 % (ref 43–77)
Platelets: 209 10*3/uL (ref 150–400)
RDW: 23.7 % — ABNORMAL HIGH (ref 11.5–15.5)
WBC: 8 10*3/uL (ref 4.0–10.5)

## 2012-10-07 LAB — COMPREHENSIVE METABOLIC PANEL
ALT: 47 U/L (ref 0–53)
AST: 101 U/L — ABNORMAL HIGH (ref 0–37)
CO2: 24 mEq/L (ref 19–32)
Chloride: 95 mEq/L — ABNORMAL LOW (ref 96–112)
GFR calc non Af Amer: 44 mL/min — ABNORMAL LOW (ref >90)
Sodium: 132 mEq/L — ABNORMAL LOW (ref 135–145)
Total Bilirubin: 1.7 mg/dL — ABNORMAL HIGH (ref 0.3–1.2)

## 2012-10-07 LAB — POCT I-STAT TROPONIN I: Troponin i, poc: 0.1 ng/mL (ref 0.00–0.08)

## 2012-10-07 MED ORDER — HYDROMORPHONE HCL PF 1 MG/ML IJ SOLN
1.0000 mg | Freq: Once | INTRAMUSCULAR | Status: AC
  Administered 2012-10-07: 1 mg via INTRAVENOUS
  Filled 2012-10-07: qty 1

## 2012-10-07 MED ORDER — FUROSEMIDE 10 MG/ML IJ SOLN
40.0000 mg | Freq: Once | INTRAMUSCULAR | Status: AC
  Administered 2012-10-07: 40 mg via INTRAVENOUS
  Filled 2012-10-07: qty 4

## 2012-10-07 MED ORDER — ONDANSETRON HCL 4 MG/2ML IJ SOLN
4.0000 mg | Freq: Once | INTRAMUSCULAR | Status: AC
Start: 2012-10-07 — End: 2012-10-07
  Administered 2012-10-07: 4 mg via INTRAVENOUS
  Filled 2012-10-07: qty 2

## 2012-10-07 MED ORDER — OXYBUTYNIN CHLORIDE 5 MG PO TABS
5.0000 mg | ORAL_TABLET | Freq: Once | ORAL | Status: AC
  Administered 2012-10-07: 5 mg via ORAL
  Filled 2012-10-07: qty 1

## 2012-10-07 MED ORDER — DEXTROSE 5 % IV SOLN
1.0000 g | Freq: Once | INTRAVENOUS | Status: AC
  Administered 2012-10-07: 1 g via INTRAVENOUS
  Filled 2012-10-07: qty 10

## 2012-10-07 NOTE — ED Notes (Signed)
Pt present with urinary retention.  Pt last seen at urology office, Dr Berneice Heinrich.  Pt has foley changed out but pt still unable to void.  Pt present with generalized swelling and sob.

## 2012-10-07 NOTE — ED Notes (Signed)
Xray bedside to perform portable

## 2012-10-07 NOTE — ED Notes (Signed)
Hospitalist and RN made aware of critical lab results.

## 2012-10-07 NOTE — ED Notes (Signed)
Bladder scan revealed greater than 200 ml of urine in bladder. Nurse at bedside and aware of reading.

## 2012-10-07 NOTE — ED Provider Notes (Signed)
History     CSN: [id number]  Arrival date & time 10/24/12  2027   First MD Initiated Contact with Patient 24-Oct-2012 2112      Chief Complaint  Patient presents with  . Urinary Retention    (Consider location/radiation/quality/duration/timing/severity/associated sxs/prior treatment) Patient is a 67 y.o. male presenting with abdominal pain. The history is provided by the patient and the spouse.  Abdominal Pain The primary symptoms of the illness include abdominal pain and shortness of breath. The primary symptoms of the illness do not include fever, nausea, vomiting or diarrhea. Primary symptoms comment: diffuse swelling The current episode started yesterday. The onset of the illness was gradual. The problem has been rapidly worsening.  The abdominal pain is located in the suprapubic region and LLQ. The abdominal pain does not radiate. The severity of the abdominal pain is 10/10. The abdominal pain is relieved by nothing. The abdominal pain is exacerbated by certain positions.  The shortness of breath began today. The shortness of breath developed gradually. The patient's medical history does not include CHF or COPD.  Associated with: states had new foley placed yesterday at urology but is has not been draining. The patient has not had a change in bowel habit. Symptoms associated with the illness do not include back pain. 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    Past Medical History  Diagnosis Date  . Diabetes mellitus without complication   . Peripheral vascular disease   . Hypertensive heart disease   . Obesity (BMI 30-39.9)   . Lumbar disc disease     Past Surgical History  Procedure Date  . Back surgery     numerous spine surgeries, 1966-2011  . Hip replacement     bilateral, each replaced twice  . Tonsillectomy   . Tibia im nail insertion 08/17/2012    Procedure: INTRAMEDULLARY (IM) NAIL TIBIAL;  Surgeon: Kerrin Champagne, MD;  Location: [facility]  [facility];  Service: Orthopedics;  Laterality: Right;    No family history on file.  History  Substance Use Topics  . Smoking status: Current Every Day Smoker -- 1.0 packs/day for 50 years    Types: Cigarettes  . Smokeless tobacco: Never Used  . Alcohol Use: No     Comment: rarely      Review of Systems  Constitutional: Negative for fever.  Respiratory: Positive for shortness of breath.   Cardiovascular: Positive for leg swelling.  Gastrointestinal: Positive for abdominal pain and abdominal distention. Negative for nausea, vomiting and diarrhea.  Musculoskeletal: Negative for back pain.       Full body swelling  All other systems reviewed and are negative.    Allergies  Review of patient's allergies indicates no known allergies.  Home Medications   Current Outpatient Rx  Name  Route  Sig  Dispense  Refill  . ALBUTEROL SULFATE (5 MG/ML) 0.5% IN NEBU   Nebulization   Take 0.5 mLs (2.5 mg total) by nebulization 2 (two) times daily. And q4hours PRN for wheezing and SOB   20 mL      . ASPIRIN EC 325 MG PO TBEC   Oral   Take 1 tablet (325 mg total) by mouth 2 (two) times daily after a meal.   100 tablet   3   . BISACODYL 5 MG PO TBEC   Oral   Take 1 tablet (5 mg total) by mouth daily as needed.   30 tablet   1   . FUROSEMIDE 40 MG PO  TABS   Oral   Take 1 tablet (40 mg total) by mouth daily.   30 tablet      . HYDROCODONE-ACETAMINOPHEN 7.5-325 MG PO TABS   Oral   Take 1 tablet by mouth every 6 (six) hours as needed. Pain         . INSULIN ASPART 100 UNIT/ML [place] SOLN   Subcutaneous   Inject 0-15 Units into the skin 3 (three) times daily with meals. CBG < 70: Drink juice; CBG 70 - 120: 0 units: CBG 121 - 150: 2 units; CBG 151 - 200: 3 units; CBG 201 - 250: 5 units; CBG 251 - 300: 8 units;CBG 301 - 350: 11 units; CBG 351 - 400: 15 units; CBG > 400 : 15 units and notify MD   1 vial      . METFORMIN HCL ER (OSM) 500 MG PO TB24   Oral   Take 500 mg by mouth 3  (three) times daily.         Marland Kitchen METHOCARBAMOL 500 MG PO TABS   Oral   Take 1 tablet (500 mg total) by mouth every 6 (six) hours as needed.   60 tablet   2   . METOPROLOL TARTRATE 50 MG PO TABS   Oral   Take 2 tablets (100 mg total) by mouth 2 (two) times daily.         . OXYCODONE HCL ER 40 MG PO T12A   Oral   Take 1 tablet (40 mg total) by mouth every 12 (twelve) hours.   60 tablet   0   . POLYETHYLENE GLYCOL 3350 PO PACK   Oral   Take 17 g by mouth daily as needed.   14 each   0   . TIOTROPIUM BROMIDE MONOHYDRATE 18 MCG IN CAPS   Inhalation   Place 1 capsule (18 mcg total) into inhaler and inhale daily.   30 capsule        BP 188/85  Pulse 113  Temp 98.1 F (36.7 C) (Oral)  Resp 16  SpO2 100%  Physical Exam  Nursing note and vitals reviewed. Constitutional: He is oriented to person, place, and time. He appears well-developed and well-nourished. No distress.  HENT:  Head: Normocephalic and atraumatic.  Mouth/Throat: Oropharynx is clear and moist.  Eyes: Conjunctivae normal and EOM are normal. Pupils are equal, round, and reactive to light.       Periorbital edema  Neck: Normal range of motion. Neck supple.  Cardiovascular: Regular rhythm and intact distal pulses.  Tachycardia present.   No murmur heard. Pulmonary/Chest: Tachypnea noted. No respiratory distress. He has wheezes. He has rales.  Abdominal: Soft. He exhibits no distension. There is no tenderness. There is no rebound and no guarding.  Musculoskeletal: Normal range of motion. He exhibits edema. He exhibits no tenderness.       Bulla with weeping in the lower legs worse on the left.  4+ edema in the lower ext and diffuse anasarca  Neurological: He is alert and oriented to person, place, and time.  Skin: Skin is warm and dry. No rash noted. No erythema.  Psychiatric: He has a normal mood and affect. His behavior is normal.    ED Course  Procedures (including critical care time)  Labs Reviewed    GLUCOSE, CAPILLARY - Abnormal; Notable for the following:    Glucose-Capillary 117 (*)     All other components within normal limits  COMPREHENSIVE METABOLIC PANEL - Abnormal; Notable for the  following:    Sodium 132 (*)     Chloride 95 (*)     Glucose, Bld 108 (*)     BUN 42 (*)     Creatinine, Ser 1.56 (*)     Albumin 3.2 (*)     AST 101 (*)     Total Bilirubin 1.7 (*)     GFR calc non Af Amer 44 (*)     GFR calc Af Amer 51 (*)     All other components within normal limits  PRO B NATRIURETIC PEPTIDE - Abnormal; Notable for the following:    Pro B Natriuretic peptide (BNP) 5080.0 (*)     All other components within normal limits  CBC WITH DIFFERENTIAL  URINALYSIS, ROUTINE W REFLEX MICROSCOPIC  URINALYSIS, MICROSCOPIC ONLY   Dg Chest Port 1 View  Oct 23, 2012  *RADIOLOGY REPORT*  Clinical Data: Shortness of breath.  PORTABLE CHEST - 1 VIEW  Comparison: Chest radiograph and CTA of the chest performed 08/17/2012  Findings: The lungs are well expanded.  Vascular congestion is noted, with minimally increased interstitial markings, compatible with recurrent mild pulmonary edema.  No definite pleural effusion or pneumothorax is seen.  The cardiomediastinal silhouette is enlarged.  No acute osseous abnormalities are identified.  IMPRESSION: Vascular congestion and cardiomegaly, with minimally increased interstitial markings, compatible with recurrent mild interstitial edema.   Original Report Authenticated By: Tonia Ghent, M.D.     Date: 23-Oct-2012  Rate: 116  Rhythm: sinus tachycardia  QRS Axis: right  Intervals: normal  ST/T Wave abnormalities: normal  Conduction Disutrbances:none  Narrative Interpretation:   Old EKG Reviewed: unchanged     1. UTI (lower urinary tract infection)   2. Urinary retention   3. Anasarca   4. CHF (congestive heart failure)       MDM   Patient coming in to 2 anasarca, left lower abdominal pain and no output in his Foley catheter. Patient states  he had a new Foley placed by urology yesterday and he has not had any urine draining from it. He noticed worsening swelling in his legs, abdomen and face today as well as the development of a lower abdominal pain. On bedside ultrasound patient has greater then 400 cc of fluid in his bladder which I feel is most likely the cause of his discomfort. Concern for new renal failure do to a postobstructive nephropathy which is also causing his anasarca. Initially patient was satting 81% on room air which improved with nasal cannula oxygen. He was given pain control Foley was removed and attempted to place a new Foley catheter to drain the bladder.  CBC, CMP, UA, BNP, chest x-ray pending  10:18 PM Before a catheter placed and drained approximately 7-800 MLS of urine. Patient's abdominal pain significantly improved after this. Patient's vital signs are showing a postobstructive nephropathy with mild bump in his creatinine to 1.56. BNP is unchanged from his last just about 5000. Patient's heart rate improved after Foley catheter placement. However given his hypoxia, anasarca and now bulla in skin sloughing on the lower sternum his will admit for diuresis and wound care.     Gwyneth Sprout, MD Oct 23, 2012 2248

## 2012-10-07 NOTE — H&P (Signed)
Chief Complaint:  No urine in foley  HPI: 67 yo male had his foley changed by urology yesteereday and has no uop since then in his bag.  Was hospitalized last month and had surgery on his rt tib/fib after having a fracture.  It was recommended he go home with oxygen at that time but he did not.  ED changed out his catheter and has gotten about 800 cc uop clear urine.  However also found to be hypoxic in the ED with generalized swelling.  He chronically has le swelling but it has been worse in the last couple of days and has also had swelling in his pannus and face.  No fevers.  No cp.  No sob.  No n/v.  No cough.  Review of Systems:  O/w neg  Past Medical History: Past Medical History  Diagnosis Date  . Diabetes mellitus without complication   . Peripheral vascular disease   . Hypertensive heart disease   . Obesity (BMI 30-39.9)   . Lumbar disc disease    Past Surgical History  Procedure Date  . Back surgery     numerous spine surgeries, 1966-2011  . Hip replacement     bilateral, each replaced twice  . Tonsillectomy   . Tibia im nail insertion 08/17/2012    Procedure: INTRAMEDULLARY (IM) NAIL TIBIAL;  Surgeon: Kerrin Champagne, MD;  Location: [facility];  Service: Orthopedics;  Laterality: Right;    Medications: Prior to Admission medications   Medication Sig Start Date End Date Taking? Authorizing Provider  insulin aspart (NOVOLOG) 100 UNIT/ML injection Inject 0-15 Units into the skin 3 (three) times daily with meals. CBG < 70: Drink juice; CBG 70 - 120: 0 units: CBG 121 - 150: 2 units; CBG 151 - 200: 3 units; CBG 201 - 250: 5 units; CBG 251 - 300: 8 units;CBG 301 - 350: 11 units; CBG 351 - 400: 15 units; CBG > 400 : 15 units and notify MD 08/22/12  Yes Ripudeep Jenna Luo, MD  isosorbide mononitrate (IMDUR) 60 MG 24 hr tablet Take 60 mg by mouth daily.   Yes Historical Provider, MD  losartan-hydrochlorothiazide (HYZAAR) 100-25 MG per tablet Take 1 tablet by mouth daily.   Yes Historical  Provider, MD  metformin (FORTAMET) 500 MG (OSM) 24 hr tablet Take 500 mg by mouth 3 (three) times daily.   Yes Historical Provider, MD  metoprolol (LOPRESSOR) 50 MG tablet Take 50 mg by mouth 2 (two) times daily.   Yes Historical Provider, MD  OxyCODONE (OXYCONTIN) 40 mg T12A Take 1 tablet (40 mg total) by mouth every 12 (twelve) hours. 08/22/12  Yes Kerrin Champagne, MD  polyethylene glycol [facility] / GLYCOLAX) packet Take 17 g by mouth daily as needed. For constipation   Yes Historical Provider, MD  silodosin (RAPAFLO) 8 MG CAPS capsule Take 8 mg by mouth daily.   Yes Historical Provider, MD  tiotropium (SPIRIVA) 18 MCG inhalation capsule Place 1 capsule (18 mcg total) into inhaler and inhale daily. 08/22/12  Yes Ripudeep Jenna Luo, MD  albuterol (PROVENTIL) (5 MG/ML) 0.5% nebulizer solution Take 0.5 mLs (2.5 mg total) by nebulization 2 (two) times daily. And q4hours PRN for wheezing and SOB 08/22/12   Ripudeep Jenna Luo, MD    Allergies:  No Known Allergies  Social History:  reports that he has been smoking Cigarettes.  He has a 50 pack-year smoking history. He has never used smokeless tobacco. He reports that he does not drink alcohol or use  illicit drugs.  Family History: History reviewed. No pertinent family history.  Physical Exam: Filed Vitals:   09/21/2012 2040 10/05/2012 2106 09/27/2012 2112  BP: 188/85    Pulse: 120 78 113  Temp: 98.1 F (36.7 C)    TempSrc: Oral    Resp: 22 15 16   SpO2: 81% 80% 100%   General appearance: alert, cooperative and no distress Neck: no JVD and supple, symmetrical, trachea midline Lungs: clear to auscultation bilaterally Heart: regular rate and rhythm, S1, S2 normal, no murmur, click, rub or gallop Abdomen: soft, non-tender; bowel sounds normal; no masses,  no organomegaly pannus dependent edama Extremities: edema 3-4+ edema to knees Pulses: 2+ and symmetric Skin: Skin color, texture, turgor normal. No rashes or lesions blissters to lle Neurologic: Grossly  normal    Labs on Admission:   [facility] 10/12/2012 2125  NA 132*  K 4.3  CL 95*  CO2 24  GLUCOSE 108*  BUN 42*  CREATININE 1.56*  CALCIUM 8.8  MG --  PHOS --    Basename 10/17/2012 2125  AST 101*  ALT 47  ALKPHOS 124*  BILITOT 1.7*  PROT 8.3  ALBUMIN 3.2*    Basename 09/20/2012 2125  WBC 8.0  NEUTROABS 6.0  HGB 10.5*  HCT 34.5*  MCV 79.9  PLT 209   Radiological Exams on Admission: Dg Chest Port 1 View  09/30/2012  *RADIOLOGY REPORT*  Clinical Data: Shortness of breath.  PORTABLE CHEST - 1 VIEW  Comparison: Chest radiograph and CTA of the chest performed 08/17/2012  Findings: The lungs are well expanded.  Vascular congestion is noted, with minimally increased interstitial markings, compatible with recurrent mild pulmonary edema.  No definite pleural effusion or pneumothorax is seen.  The cardiomediastinal silhouette is enlarged.  No acute osseous abnormalities are identified.  IMPRESSION: Vascular congestion and cardiomegaly, with minimally increased interstitial markings, compatible with recurrent mild interstitial edema.   Original Report Authenticated By: Tonia Ghent, M.D.     Assessment/Plan  67 yo male presented with urinary retention chronic indwelling foley found to have hypoxia and worsening diffuse swelling Principal Problem:  *Acute respiratory failure with hypoxia Active Problems:  Fracture of tibia, distal, right, closed  Acute kidney injury  COPD (chronic obstructive pulmonary disease)  Diastolic CHF, chronic  Chronic urethral stricture  Urinary retention  UTI (lower urinary tract infection)  Catheter has been replaced.  Has uti place on rocephin.  Place on lasix.  Ck realbumin level.  H/o chf and copd.  Also recent surgery so will also ck ble [place] and vq scan to r/o vte.    Kaylah Chiasson A 10/13/2012, 11:03 PM

## 2012-10-08 ENCOUNTER — Inpatient Hospital Stay (HOSPITAL_COMMUNITY): Payer: 59

## 2012-10-08 ENCOUNTER — Encounter (HOSPITAL_COMMUNITY): Payer: Self-pay | Admitting: *Deleted

## 2012-10-08 DIAGNOSIS — M79609 Pain in unspecified limb: Secondary | ICD-10-CM

## 2012-10-08 DIAGNOSIS — M7989 Other specified soft tissue disorders: Secondary | ICD-10-CM

## 2012-10-08 LAB — GLUCOSE, CAPILLARY: Glucose-Capillary: 144 mg/dL — ABNORMAL HIGH (ref 70–99)

## 2012-10-08 LAB — BASIC METABOLIC PANEL
BUN: 42 mg/dL — ABNORMAL HIGH (ref 6–23)
Calcium: 8.8 mg/dL (ref 8.4–10.5)
Creatinine, Ser: 1.65 mg/dL — ABNORMAL HIGH (ref 0.50–1.35)
GFR calc Af Amer: 48 mL/min — ABNORMAL LOW (ref >90)
GFR calc non Af Amer: 41 mL/min — ABNORMAL LOW (ref >90)
Glucose, Bld: 85 mg/dL (ref 70–99)
Potassium: 3.9 mEq/L (ref 3.5–5.1)

## 2012-10-08 LAB — CBC
MCH: 24.2 pg — ABNORMAL LOW (ref 26.0–34.0)
MCHC: 29.8 g/dL — ABNORMAL LOW (ref 30.0–36.0)
Platelets: 158 10*3/uL (ref 150–400)
RDW: 22.9 % — ABNORMAL HIGH (ref 11.5–15.5)

## 2012-10-08 LAB — RAPID URINE DRUG SCREEN, HOSP PERFORMED
Barbiturates: NOT DETECTED
Tetrahydrocannabinol: NOT DETECTED

## 2012-10-08 LAB — TROPONIN I
Troponin I: <0.3 ng/mL (ref <0.30)
Troponin I: <0.3 ng/mL (ref <0.30)
Troponin I: <0.3 ng/mL (ref <0.30)

## 2012-10-08 LAB — MRSA PCR SCREENING: MRSA by PCR: NEGATIVE

## 2012-10-08 LAB — ETHANOL: Alcohol, Ethyl (B): <11 mg/dL (ref 0–11)

## 2012-10-08 LAB — PREALBUMIN: Prealbumin: 9.4 mg/dL — ABNORMAL LOW (ref 17.0–34.0)

## 2012-10-08 MED ORDER — TIOTROPIUM BROMIDE MONOHYDRATE 18 MCG IN CAPS
18.0000 ug | ORAL_CAPSULE | Freq: Every day | RESPIRATORY_TRACT | Status: DC
  Administered 2012-10-08 – 2012-10-22 (×15): 18 ug via RESPIRATORY_TRACT
  Filled 2012-10-08 (×3): qty 5

## 2012-10-08 MED ORDER — HYDROCHLOROTHIAZIDE 25 MG PO TABS
25.0000 mg | ORAL_TABLET | Freq: Every day | ORAL | Status: DC
  Administered 2012-10-08: 25 mg via ORAL
  Filled 2012-10-08: qty 1

## 2012-10-08 MED ORDER — MORPHINE SULFATE 4 MG/ML IJ SOLN
4.0000 mg | Freq: Once | INTRAMUSCULAR | Status: AC
  Administered 2012-10-08: 4 mg via INTRAVENOUS

## 2012-10-08 MED ORDER — INSULIN ASPART 100 UNIT/ML ~~LOC~~ SOLN
0.0000 [IU] | Freq: Three times a day (TID) | SUBCUTANEOUS | Status: DC
  Administered 2012-10-08: 0.2 [IU] via SUBCUTANEOUS

## 2012-10-08 MED ORDER — ENOXAPARIN SODIUM 100 MG/ML ~~LOC~~ SOLN
1.0000 mg/kg | Freq: Once | SUBCUTANEOUS | Status: AC
  Administered 2012-10-08: 90 mg via SUBCUTANEOUS
  Filled 2012-10-08: qty 1

## 2012-10-08 MED ORDER — SODIUM CHLORIDE 0.9 % IJ SOLN: 3.0000 mL | INTRAMUSCULAR | Status: DC | PRN

## 2012-10-08 MED ORDER — INSULIN ASPART 100 UNIT/ML ~~LOC~~ SOLN: 0.0000 [IU] | Freq: Three times a day (TID) | SUBCUTANEOUS | Status: DC

## 2012-10-08 MED ORDER — INSULIN ASPART 100 UNIT/ML ~~LOC~~ SOLN
0.0000 [IU] | Freq: Three times a day (TID) | SUBCUTANEOUS | Status: DC
  Administered 2012-10-09: 2 [IU] via SUBCUTANEOUS
  Administered 2012-10-09 – 2012-10-10 (×3): 3 [IU] via SUBCUTANEOUS
  Administered 2012-10-12 – 2012-10-18 (×12): 2 [IU] via SUBCUTANEOUS
  Administered 2012-10-19 – 2012-10-20 (×5): 3 [IU] via SUBCUTANEOUS
  Administered 2012-10-21: 8 [IU] via SUBCUTANEOUS
  Administered 2012-10-21: 3 [IU] via SUBCUTANEOUS
  Administered 2012-10-21: 2 [IU] via SUBCUTANEOUS
  Administered 2012-10-22 (×2): 8 [IU] via SUBCUTANEOUS
  Administered 2012-10-22: 3 [IU] via SUBCUTANEOUS
  Administered 2012-10-23: 11 [IU] via SUBCUTANEOUS

## 2012-10-08 MED ORDER — MORPHINE SULFATE 4 MG/ML IJ SOLN
4.0000 mg | INTRAMUSCULAR | Status: DC | PRN
  Administered 2012-10-08: 4 mg via INTRAVENOUS

## 2012-10-08 MED ORDER — SODIUM CHLORIDE 0.9 % IJ SOLN
3.0000 mL | Freq: Two times a day (BID) | INTRAMUSCULAR | Status: DC
  Administered 2012-10-08 – 2012-10-10 (×4): 3 mL via INTRAVENOUS

## 2012-10-08 MED ORDER — NITROGLYCERIN 2 % TD OINT
0.5000 [in_us] | TOPICAL_OINTMENT | Freq: Four times a day (QID) | TRANSDERMAL | Status: DC
  Filled 2012-10-08: qty 30

## 2012-10-08 MED ORDER — ALBUTEROL SULFATE (5 MG/ML) 0.5% IN NEBU
2.5000 mg | INHALATION_SOLUTION | Freq: Two times a day (BID) | RESPIRATORY_TRACT | Status: DC
  Administered 2012-10-08: 2.5 mg via RESPIRATORY_TRACT
  Filled 2012-10-08: qty 0.5

## 2012-10-08 MED ORDER — HYDRALAZINE HCL 50 MG PO TABS
50.0000 mg | ORAL_TABLET | Freq: Four times a day (QID) | ORAL | Status: DC
  Administered 2012-10-08 – 2012-10-09 (×2): 50 mg via ORAL
  Filled 2012-10-08 (×8): qty 1

## 2012-10-08 MED ORDER — MORPHINE SULFATE 4 MG/ML IJ SOLN
4.0000 mg | INTRAMUSCULAR | Status: DC | PRN
  Administered 2012-10-08 – 2012-10-09 (×6): 4 mg via INTRAVENOUS
  Filled 2012-10-08 (×6): qty 1

## 2012-10-08 MED ORDER — ISOSORBIDE MONONITRATE ER 60 MG PO TB24
60.0000 mg | ORAL_TABLET | Freq: Every day | ORAL | Status: DC
  Administered 2012-10-08 – 2012-10-22 (×14): 60 mg via ORAL
  Filled 2012-10-08 (×16): qty 1

## 2012-10-08 MED ORDER — MORPHINE SULFATE 4 MG/ML IJ SOLN
INTRAMUSCULAR | Status: AC
  Filled 2012-10-08: qty 1

## 2012-10-08 MED ORDER — NITROGLYCERIN IN D5W 200-5 MCG/ML-% IV SOLN
2.0000 ug/min | INTRAVENOUS | Status: DC
  Administered 2012-10-08: 5 ug/min via INTRAVENOUS
  Filled 2012-10-08: qty 250

## 2012-10-08 MED ORDER — MORPHINE SULFATE 4 MG/ML IJ SOLN: 4.0000 mg | Freq: Once | INTRAMUSCULAR | Status: DC

## 2012-10-08 MED ORDER — FUROSEMIDE 10 MG/ML IJ SOLN
40.0000 mg | Freq: Two times a day (BID) | INTRAMUSCULAR | Status: DC
  Administered 2012-10-08 (×2): 40 mg via INTRAVENOUS
  Filled 2012-10-08 (×5): qty 4

## 2012-10-08 MED ORDER — TAMSULOSIN HCL 0.4 MG PO CAPS
0.4000 mg | ORAL_CAPSULE | Freq: Every day | ORAL | Status: DC
  Administered 2012-10-08 – 2012-10-22 (×15): 0.4 mg via ORAL
  Filled 2012-10-08 (×16): qty 1

## 2012-10-08 MED ORDER — ENOXAPARIN SODIUM 120 MG/0.8ML ~~LOC~~ SOLN
1.0000 mg/kg | Freq: Two times a day (BID) | SUBCUTANEOUS | Status: DC
  Administered 2012-10-08 – 2012-10-09 (×3): 110 mg via SUBCUTANEOUS
  Filled 2012-10-08 (×4): qty 0.8

## 2012-10-08 MED ORDER — LOSARTAN POTASSIUM 50 MG PO TABS
100.0000 mg | ORAL_TABLET | Freq: Every day | ORAL | Status: DC
  Administered 2012-10-08: 100 mg via ORAL
  Filled 2012-10-08 (×2): qty 2

## 2012-10-08 MED ORDER — TECHNETIUM TC 99M DIETHYLENETRIAME-PENTAACETIC ACID
39.1000 | Freq: Once | INTRAVENOUS | Status: AC | PRN
  Administered 2012-10-08: 39.1 via INTRAVENOUS

## 2012-10-08 MED ORDER — ALBUTEROL SULFATE (5 MG/ML) 0.5% IN NEBU: 2.5000 mg | INHALATION_SOLUTION | Freq: Four times a day (QID) | RESPIRATORY_TRACT | Status: DC | PRN

## 2012-10-08 MED ORDER — SODIUM CHLORIDE 0.9 % IV SOLN: 250.0000 mL | INTRAVENOUS | Status: DC | PRN

## 2012-10-08 MED ORDER — METOPROLOL TARTRATE 50 MG PO TABS
50.0000 mg | ORAL_TABLET | Freq: Two times a day (BID) | ORAL | Status: DC
  Administered 2012-10-08 (×2): 50 mg via ORAL
  Filled 2012-10-08 (×4): qty 1

## 2012-10-08 MED ORDER — LOSARTAN POTASSIUM-HCTZ 100-25 MG PO TABS: 1.0000 | ORAL_TABLET | Freq: Every day | ORAL | Status: DC

## 2012-10-08 MED ORDER — KETOROLAC TROMETHAMINE 30 MG/ML IJ SOLN
30.0000 mg | Freq: Once | INTRAMUSCULAR | Status: AC
  Administered 2012-10-08: 30 mg via INTRAVENOUS
  Filled 2012-10-08: qty 1

## 2012-10-08 MED ORDER — DEXTROSE 5 % IV SOLN
1.0000 g | INTRAVENOUS | Status: DC
  Administered 2012-10-09 – 2012-10-14 (×6): 1 g via INTRAVENOUS
  Filled 2012-10-08 (×6): qty 10

## 2012-10-08 MED ORDER — OXYCODONE HCL ER 40 MG PO T12A
40.0000 mg | EXTENDED_RELEASE_TABLET | Freq: Two times a day (BID) | ORAL | Status: DC
  Administered 2012-10-08 – 2012-10-12 (×10): 40 mg via ORAL
  Filled 2012-10-08: qty 1
  Filled 2012-10-08: qty 4
  Filled 2012-10-08 (×2): qty 2
  Filled 2012-10-08: qty 1
  Filled 2012-10-08 (×5): qty 2

## 2012-10-08 MED ORDER — ENOXAPARIN SODIUM 100 MG/ML ~~LOC~~ SOLN
1.0000 mg/kg | Freq: Two times a day (BID) | SUBCUTANEOUS | Status: DC
  Filled 2012-10-08 (×2): qty 1

## 2012-10-08 MED ORDER — TECHNETIUM TO 99M ALBUMIN AGGREGATED
5.5000 | Freq: Once | INTRAVENOUS | Status: AC | PRN
  Administered 2012-10-08: 6 via INTRAVENOUS

## 2012-10-08 MED ORDER — SODIUM CHLORIDE 0.9 % IJ SOLN
3.0000 mL | Freq: Two times a day (BID) | INTRAMUSCULAR | Status: DC
  Administered 2012-10-08: 3 mL via INTRAVENOUS

## 2012-10-08 NOTE — Progress Notes (Addendum)
[facility] PROGRESS NOTE  BURNETT SPRAY ZOX:[id number] DOB: 03-02-1946 DOA: 10/19/12 PCP: No primary provider on file.  Brief narrative: 67 -year-old male with multiple medical comorbidities including but not limited to COPD, diastolic CHF, chronic urethral stricture and urinary retention, recent hospitalization for right tibia, fibula fracture, obesity who presented 2012/10/19 to 2 urinary retention. Patient had a Foley catheter changed by urology one day prior to this admission but has not had urine output.  In ED, Foley catheter was changed and 800 cc clear urine drained. Patient was found to be hypoxic in ED with generalized swelling. He was further admitted for evaluation of hypoxia. VQ scan is pending at this time. He was started on Lasix for mild interstitial edema based on chest x-ray. In addition, creatinine was 1.65 on this admission.  Assessment/Plan:  Principal Problem:  *Acute respiratory failure with hypoxia  Likely secondary to COPD exacerbation versus mild interstitial edema  Patient is now saturating well, 95% on 2 L nasal cannula  We have continued home spiriva  Followup VQ scan to rule out pulmonary embolus  Continue Lasix 40 mg IV twice a day  We will transfer the patient to telemetry unit today  We will discontinue HCTZ, patient is already on Lasix  Active Problems:  Fracture of tibia, distal, right, closed  Stable  Followup physical therapy evaluation  Diabetes mellitus type 2 with complications  Check A1c  Continue sliding scale insulin  Appreciate diabetic coordinator recommendations  Anemia of chronic disease  Perhaps secondary to CHF  No signs of active bleed  Hemoglobin 10.3  Acute kidney injury  Secondary to UTI, urinary retention and Lasix  We will continue Lasix and kidney function may improve now that urinary retention is resolved  Obesity (BMI 30-39.9)  Nutrition consult  Chronic pain  Continue oxycodone 40 mg every 12  hours scheduled and morphine 4 mg q. 2 hours as needed IV for severe pain  Hypertensive heart disease  Blood pressure 128/109  Continue Imdur 60 mg daily, and hydralazine 50 mg every 6 hours  Continue Cozaar 100 mg daily and metoprolol 50 mg twice a day  COPD (chronic obstructive pulmonary disease)  Stable  Continue Spiriva  Diastolic CHF, chronic  BNP on admission 5080  Continue Lasix 40 mg IV twice daily  Strict intake and output  Daily weight  Urinary retention   Secondary to chronic urethral stricture  Foley draining well with 1.5 L drained in last 24 hours  UTI (lower urinary tract infection)  Continue Rocephin  Followup results of urine culture  Code Status: Full code Family Communication: Family not at bedside Disposition Plan: Continue monitoring in step down unit  Manson Passey, MD  [facility] Pager [phone]  If 7PM-7AM, please contact night-coverage [email or web address] Password [facility] 10/08/2012, 7:35 AM   LOS: 1 day   Consultants:  None   Procedures:  None  Antibiotics:  Rocephin Oct 19, 2012 -->  HPI/Subjective: No acute events since admission.  Objective: Filed Vitals:   10/08/12 0215 10/08/12 0230 10/08/12 0300 10/08/12 0413  BP: 150/78 153/86 152/93   Pulse: 132 115 134   Temp:    98.2 F (36.8 C)  TempSrc:      Resp: 19 21 16    Height:    6\' 1"  (1.854 m)  Weight:    110.133 kg (242 lb 12.8 oz)  SpO2: 100% 97% 88%     Intake/Output Summary (Last 24 hours) at 10/08/12 0735 Last data filed at 10/08/12 0245  Gross per 24  hour  Intake      0 ml  Output   1500 ml  Net  -1500 ml    Exam:   General:  Pt is alert, follows commands appropriately, not in acute distress  Cardiovascular: Regular rate and rhythm, S1/S2, no murmurs, no rubs, no gallops  Respiratory: Crackles at bases, diminished breath sounds, no wheezing  Abdomen: Soft, non tender, non distended, bowel sounds present, no guarding  Extremities: Lower extremity +3 pitting  edema, pulses DP and PT palpable bilaterally  Neuro: Grossly nonfocal  Data Reviewed: Basic Metabolic Panel:  Lab 10/08/12 1610 26-Oct-2012 2125  NA 135 132*  K 3.9 4.3  CL 96 95*  CO2 26 24  GLUCOSE 85 108*  BUN 42* 42*  CREATININE 1.65* 1.56*  CALCIUM 8.8 8.8   Liver Function Tests:  Lab 10-26-2012 2125  AST 101*  ALT 47  ALKPHOS 124*  BILITOT 1.7*  PROT 8.3  ALBUMIN 3.2*   CBC:  Lab 10/08/12 0445 10/26/2012 2125  WBC 8.9 8.0  NEUTROABS -- 6.0  HGB 10.3* 10.5*  HCT 34.6* 34.5*  MCV 81.2 79.9  PLT 158 209   Cardiac Enzymes:  Lab 10/08/12 0241  CKTOTAL --  CKMB --  CKMBINDEX --  TROPONINI <0.30   CBG:  Lab Oct 26, 2012 2102  GLUCAP 117*    MRSA PCR SCREENING     Status: Normal   Collection Time   10/08/12  4:21 AM      Component Value Range Status Comment   MRSA by PCR NEGATIVE  NEGATIVE Final      Studies: Dg Chest Port 1 View  26-Oct-2012    IMPRESSION: Vascular congestion and cardiomegaly, with minimally increased interstitial markings, compatible with recurrent mild interstitial edema.     Scheduled Meds:   . albuterol  2.5 mg Nebulization BID  . cefTRIAXone  1 g Intravenous Q24H  . enoxaparin (LOVENOX)  1 mg/kg Subcutaneous Q12H  . furosemide  40 mg Intravenous BID  . losartan  100 mg Oral Daily  . hydrochlorothiazide  25 mg Oral Daily  . insulin aspart  0-15 Units Subcutaneous TID WC  . isosorbide mononitrate  60 mg Oral Daily  . metoprolol  50 mg Oral BID  . morphine      . OxyCODONE  40 mg Oral Q12H  . Tamsulosin HCl  0.4 mg Oral Daily  . tiotropium  18 mcg Inhalation Daily

## 2012-10-08 NOTE — ED Notes (Signed)
releived Carollee Herter RN for lunch break, assumed care of pt

## 2012-10-08 NOTE — Consult Note (Signed)
WOC consult Note Reason for Consult:patient seen in concert with Wound Treatment Associate (WTA), Alvy Beal. Patient with bilateral LE edema and scattered intact bullae (blisters). Patient refuses any covering/protective dressings over LEs.  Patient has agreed to LE elevation using pillows.  Today we suggest heel and LE elevation using bilateral Prevalon Boots. Patient agrees to trying these boots, so they are ordered. I will not follow, but will remain available to this patient, his nursing and his medical staff.  Please re-consult if needed. Thanks, Ladona Mow, MSN, RN, [facility], CWOCN [phone])

## 2012-10-08 NOTE — Progress Notes (Signed)
ANTICOAGULATION CONSULT NOTE - Initial Consult  Pharmacy Consult for Lovenxo Indication: R/O VTE  No Known Allergies  Patient Measurements: Height: 6\' 1"  (185.4 cm) Weight: 242 lb 12.8 oz (110.133 kg) IBW/kg (Calculated) : 79.9    Vital Signs: Temp: 98.2 F (36.8 C) (01/22 0413) Temp src: Oral (01/21 2040) BP: 152/93 mmHg (01/22 0300) Pulse Rate: 134  (01/22 0300)  Labs:  Basename 10/08/12 0445 10/08/12 0241 09/20/2012 2125  HGB 10.3* -- 10.5*  HCT 34.6* -- 34.5*  PLT 158 -- 209  APTT -- -- --  LABPROT -- -- --  INR -- -- --  HEPARINUNFRC -- -- --  CREATININE 1.65* -- 1.56*  CKTOTAL -- -- --  CKMB -- -- --  TROPONINI -- <0.30 --    Estimated Creatinine Clearance: 56.5 ml/min (by C-G formula based on Cr of 1.65).   Medical History: Past Medical History  Diagnosis Date  . Diabetes mellitus without complication   . Peripheral vascular disease   . Hypertensive heart disease   . Obesity (BMI 30-39.9)   . Lumbar disc disease   . Anginal pain   . Hypertension     Medications:  Scheduled:    . albuterol  2.5 mg Nebulization BID  . [COMPLETED] cefTRIAXone (ROCEPHIN)  IV  1 g Intravenous Once  . cefTRIAXone (ROCEPHIN)  IV  1 g Intravenous Q24H  . enoxaparin (LOVENOX) injection  1 mg/kg Subcutaneous Q12H  . [COMPLETED] enoxaparin (LOVENOX) injection  1 mg/kg Subcutaneous Once  . [COMPLETED] furosemide  40 mg Intravenous Once  . furosemide  40 mg Intravenous BID  . losartan  100 mg Oral Daily   And  . hydrochlorothiazide  25 mg Oral Daily  . [COMPLETED]  HYDROmorphone (DILAUDID) injection  1 mg Intravenous Once  . [COMPLETED]  HYDROmorphone (DILAUDID) injection  1 mg Intravenous Once  . insulin aspart  0-15 Units Subcutaneous TID WC  . isosorbide mononitrate  60 mg Oral Daily  . [COMPLETED] ketorolac  30 mg Intravenous Once  . metoprolol  50 mg Oral BID  . [COMPLETED]  morphine injection  4 mg Intravenous Once  . morphine      . [COMPLETED] ondansetron  4 mg  Intravenous Once  . [COMPLETED] oxybutynin  5 mg Oral Once  . OxyCODONE  40 mg Oral Q12H  . sodium chloride  3 mL Intravenous Q12H  . sodium chloride  3 mL Intravenous Q12H  . Tamsulosin HCl  0.4 mg Oral Daily  . tiotropium  18 mcg Inhalation Daily  . [DISCONTINUED] losartan-hydrochlorothiazide  1 tablet Oral Daily  . [DISCONTINUED]  morphine injection  4 mg Intravenous Once  . [DISCONTINUED] nitroGLYCERIN  0.5 inch Topical Q6H   Infusions:    . nitroGLYCERIN 5 mcg/min (10/08/12 0328)    Assessment: 67 yo male admitted with urinary retention chronic indwelling foley, hypoxia and worsening diffuse swelling.  MD ordering Lovenox to R/O VTE.  Goal of Therapy:  Anti-Xa level 0.6-1.2 units/ml 4hrs after LMWH dose given    Plan:   Lovenox 110mg  IV q12h.  F/U SCr  Susanne Greenhouse R 10/08/2012,6:18 AM

## 2012-10-08 NOTE — Progress Notes (Signed)
Lower extremity venous duplex attempted. Unable to perform due to constant movement due to pain. RN waiting on physician for further pain medication. Will attempt when patient can be sedated for comfort.   [place], [place] [facility] 10/08/2012 [phone]

## 2012-10-08 NOTE — Progress Notes (Signed)
VASCULAR LAB PRELIMINARY  PRELIMINARY  PRELIMINARY  PRELIMINARY  Bilateral lower extremity venous duplex completed.    Preliminary report:  Bilateral:  No obvious evidence of DVT, superficial thrombosis, or Baker's Cyst. Somewhat technically limited due to condition of the legs and severe pain causing involuntary movement of the legs.   Jesenya Bowditch, RVS 10/08/2012, 3:12 PM

## 2012-10-09 ENCOUNTER — Inpatient Hospital Stay (HOSPITAL_COMMUNITY): Payer: 59

## 2012-10-09 DIAGNOSIS — D638 Anemia in other chronic diseases classified elsewhere: Secondary | ICD-10-CM

## 2012-10-09 LAB — GLUCOSE, CAPILLARY
Glucose-Capillary: 155 mg/dL — ABNORMAL HIGH (ref 70–99)
Glucose-Capillary: 146 mg/dL — ABNORMAL HIGH (ref 70–99)

## 2012-10-09 LAB — COMPREHENSIVE METABOLIC PANEL
Albumin: 2.6 g/dL — ABNORMAL LOW (ref 3.5–5.2)
Alkaline Phosphatase: 109 U/L (ref 39–117)
BUN: 48 mg/dL — ABNORMAL HIGH (ref 6–23)
Chloride: 96 mEq/L (ref 96–112)
Creatinine, Ser: 2.31 mg/dL — ABNORMAL HIGH (ref 0.50–1.35)
GFR calc Af Amer: 32 mL/min — ABNORMAL LOW (ref >90)
Glucose, Bld: 113 mg/dL — ABNORMAL HIGH (ref 70–99)
Potassium: 4.4 mEq/L (ref 3.5–5.1)
Total Bilirubin: 0.6 mg/dL (ref 0.3–1.2)
Total Protein: 7.2 g/dL (ref 6.0–8.3)

## 2012-10-09 LAB — CBC
HCT: 34 % — ABNORMAL LOW (ref 39.0–52.0)
HCT: 32.4 % — ABNORMAL LOW (ref 39.0–52.0)
Hemoglobin: 9.8 g/dL — ABNORMAL LOW (ref 13.0–17.0)
Hemoglobin: 9.5 g/dL — ABNORMAL LOW (ref 13.0–17.0)
MCHC: 28.8 g/dL — ABNORMAL LOW (ref 30.0–36.0)
MCHC: 29.3 g/dL — ABNORMAL LOW (ref 30.0–36.0)
MCV: 83.1 fL (ref 78.0–100.0)
MCV: 82.9 fL (ref 78.0–100.0)
RDW: 23.4 % — ABNORMAL HIGH (ref 11.5–15.5)
RDW: 23.5 % — ABNORMAL HIGH (ref 11.5–15.5)

## 2012-10-09 LAB — BASIC METABOLIC PANEL
BUN: 45 mg/dL — ABNORMAL HIGH (ref 6–23)
Chloride: 98 mEq/L (ref 96–112)
Creatinine, Ser: 2.15 mg/dL — ABNORMAL HIGH (ref 0.50–1.35)
Glucose, Bld: 164 mg/dL — ABNORMAL HIGH (ref 70–99)
Potassium: 4.9 mEq/L (ref 3.5–5.1)

## 2012-10-09 LAB — HEMOGLOBIN A1C
Hgb A1c MFr Bld: 7.3 % — ABNORMAL HIGH (ref <5.7)
Mean Plasma Glucose: 163 mg/dL — ABNORMAL HIGH (ref <117)

## 2012-10-09 MED ORDER — SODIUM CHLORIDE 0.9 % IV SOLN
250.0000 mL | INTRAVENOUS | Status: DC | PRN
  Administered 2012-10-10: 1000 mL via INTRAVENOUS
  Administered 2012-10-17: 250 mL via INTRAVENOUS
  Administered 2012-10-23: 18:00:00 via INTRAVENOUS

## 2012-10-09 MED ORDER — HYDROMORPHONE 0.3 MG/ML IV SOLN
INTRAVENOUS | Status: DC
  Administered 2012-10-09: 2.7 mg via INTRAVENOUS
  Administered 2012-10-09: 16:00:00 via INTRAVENOUS
  Administered 2012-10-10: 0.9 mg via INTRAVENOUS
  Administered 2012-10-10: 7.5 mg via INTRAVENOUS
  Administered 2012-10-10: 1.5 mg via INTRAVENOUS
  Administered 2012-10-10: 1.8 mg via INTRAVENOUS
  Administered 2012-10-10: 0.6 mg via INTRAVENOUS
  Filled 2012-10-09 (×2): qty 25

## 2012-10-09 MED ORDER — ONDANSETRON HCL 4 MG/2ML IJ SOLN
4.0000 mg | Freq: Four times a day (QID) | INTRAMUSCULAR | Status: DC | PRN
  Administered 2012-10-11 – 2012-10-13 (×2): 4 mg via INTRAVENOUS
  Filled 2012-10-09 (×2): qty 2

## 2012-10-09 MED ORDER — NALOXONE HCL 0.4 MG/ML IJ SOLN: 0.4000 mg | INTRAMUSCULAR | Status: DC | PRN

## 2012-10-09 MED ORDER — DIPHENHYDRAMINE HCL 12.5 MG/5ML PO ELIX: 12.5000 mg | ORAL_SOLUTION | Freq: Four times a day (QID) | ORAL | Status: DC | PRN

## 2012-10-09 MED ORDER — SODIUM CHLORIDE 0.9 % IJ SOLN: 9.0000 mL | INTRAMUSCULAR | Status: DC | PRN

## 2012-10-09 MED ORDER — HYDROMORPHONE HCL PF 2 MG/ML IJ SOLN
2.0000 mg | INTRAMUSCULAR | Status: DC | PRN
  Administered 2012-10-09 (×3): 2 mg via INTRAVENOUS
  Filled 2012-10-09 (×3): qty 1

## 2012-10-09 MED ORDER — DIPHENHYDRAMINE HCL 50 MG/ML IJ SOLN: 12.5000 mg | Freq: Four times a day (QID) | INTRAMUSCULAR | Status: DC | PRN

## 2012-10-09 MED ORDER — ENOXAPARIN SODIUM 40 MG/0.4ML ~~LOC~~ SOLN
40.0000 mg | SUBCUTANEOUS | Status: DC
  Administered 2012-10-10 – 2012-10-13 (×4): 40 mg via SUBCUTANEOUS
  Filled 2012-10-09 (×4): qty 0.4

## 2012-10-09 NOTE — Progress Notes (Signed)
Terry [name] 15-Oct-1945 [id number]   Brief narrative: 67yo AAM with multiple medical problems admitted on 10/09/12 for acute hypoxic respiratory failure in setting of copd exacerbation +/- mild volume overload. Called to room by RN due to complaints of abdominal pain.   S: Describes RLQ pain radiation across lower abdomen for several hours. Rates pain 10/10. Denies any associated n/v, no chest pain or sob. Last BM 4 days ago. (continually falls asleep during exam seemingly from somnolence d/t IV narcotics). Wife at bedside on exam  O: Filed Vitals:   10/09/12 1700 10/09/12 1800 10/09/12 2000 10/09/12 2032  BP: 104/69 114/66 111/71   Pulse: 120 81 86   Temp:   97.5 F (36.4 C)   TempSrc:   Oral   Resp: 20 14 16 18   Height:      Weight:      SpO2: 91% 84% 97% 98%   CBC    Component Value Date/Time   WBC 7.8 10/09/2012 0335   RBC 4.09* 10/09/2012 0335   HGB 9.8* 10/09/2012 0335   HCT 34.0* 10/09/2012 0335   PLT 166 10/09/2012 0335   MCV 83.1 10/09/2012 0335   MCH 24.0* 10/09/2012 0335   MCHC 28.8* 10/09/2012 0335   RDW 23.4* 10/09/2012 0335   LYMPHSABS 0.9 10/01/2012 2125   MONOABS 1.1* 09/24/2012 2125   EOSABS 0.0 09/26/2012 2125   BASOSABS 0.0 09/21/2012 2125    BMET    Component Value Date/Time   NA 135 10/09/2012 0335   K 4.9 10/09/2012 0335   CL 98 10/09/2012 0335   CO2 25 10/09/2012 0335   GLUCOSE 164* 10/09/2012 0335   BUN 45* 10/09/2012 0335   CREATININE 2.15* 10/09/2012 0335   CALCIUM 8.0* 10/09/2012 0335   GFRNONAA 30* 10/09/2012 0335   GFRAA 35* 10/09/2012 0335    General: easily awakened, somnolent in NAD CV: S1S2 RRR, 2-3+ bilateral LE edema Resp: decreased effort but CTAB, no increased wob GI: abdomen obese, soft, tenderness on palpation of RLQ without rebound or guarding, BS+  A/P: Abdominal pain: Unclear etiology. When speaking to RN, pain seems to change in nature and in location. This could be a component of chronic pain, but cannot rule out acute issues. Abdomen  is nonsurgical on exam. He is unable to tolerate xray/CT stating and cannot lay flat due to back issues (his renal fx would prohibit contrast use anyway). Will check cbc and cmet. ?constipation as last bm 4 days ago. ?bladdar spasms with admission issues. Consider u/s in am if pain persists.  Will follow up.  Cordelia Pen, NP-C [facility]  pgr [phone]    I have reviewed the events overnight. Patient is stable at this time. I was not present at the time the events have occurred.  We have addressed the issue of constipation since, we added Colace, MiraLAX and bisacodyl. In addition we have obtained abdominal ultrasound which is pending at this time.  [name]

## 2012-10-09 NOTE — Consult Note (Signed)
WOC consult Note Reason for Consult:Reconsult on LLE bullae (1 intact, 2 that have opened) Wound type:etiology not known   Suspect venous insufficiency vs infectious vs auto-immune Pressure Ulcer POA: No Measurement: Total area encompasses 13cm x 4cm x 0.2cm  (for opened/unroofed blisters) intact blister medially measures 3cm x 2cm Wound FAO:ZHYQMVHQ blisters have a clean, pink, moist wound bed Drainage (amount, consistency, odor) scant amount serous exudate Periwound: taut, dry Dressing procedure/placement/frequency: Patient is so uncomfortable he will try the ulcers covered at my suggestion today.  I will suggest occlusion of nerve endings with petrolatum gauze, a benign, but therapeutic intervention that will facilitate reepithelialization.  He declines wrapping and boots provided at last consult.   Suggest consultation with Dermatology for biopsy or identification of etiology and definitive treatment options for this dermatological condition. I will not follow.  Please re-consult if needed. Thanks, Ladona Mow, MSN, RN, [facility], CWOCN [phone])

## 2012-10-09 NOTE — Progress Notes (Signed)
  RD consulted for nutrition education regarding diabetes.   Lab Results  Component Value Date   HGBA1C 7.3* 10/08/2012   RD met with pt to discuss DM management.  Pt states he is "doing fine" with his nutrition and denies nutrition-related concerns. He states he has a variable appetite, however his wt is stable.  Pt having a difficult time staying awake during visit.  RD provided "Carbohydrate Counting for People with Diabetes" handout from the Academy of [employer] and Dietetics.  Pt may benefit from outpatient education.  Expect good compliance.  Body mass index is 32.03 kg/(m^2). Pt meets criteria for obesity based on current BMI.  Current diet order is CHO Mod Med, patient is consuming approximately 75% of meals at this time per pt report. Pt with blisters to legs. Labs and medications reviewed. No further nutrition interventions warranted at this time. RD contact information provided. If additional nutrition issues arise, please re-consult RD.  Loyce Dys, MS RD LDN Clinical Inpatient Dietitian Pager: [phone] Weekend/After hours pager: [phone]

## 2012-10-09 NOTE — Progress Notes (Signed)
[facility] PROGRESS NOTE  Terry [name] ZOX:[id number] DOB: May 21, 1946 DOA: 10/21/12 PCP: No primary provider on file.  Brief narrative: 67 -year-old male with multiple medical comorbidities including but not limited to COPD, diastolic CHF, chronic urethral stricture and urinary retention, recent hospitalization for right tibia, fibula fracture, obesity who presented 10/21/12 to 2 urinary retention. Patient had a Foley catheter changed by urology one day prior to this admission but has not had urine output.  In ED, Foley catheter was changed and 800 cc clear urine drained. Patient was found to be hypoxic in ED with generalized swelling. He was further admitted for evaluation of hypoxia. VQ scan shows low probability for pulmonary embolism. Doppler study negative for DVT. He was started on Lasix in ED for mild interstitial edema based on chest x-ray. In addition, creatinine was 1.65 on this admission.  Overnight, patient became hypotensive so we held his BP meds.  Assessment/Plan:   Principal Problem:  *Acute respiratory failure with hypoxia  Likely secondary to COPD exacerbation versus mild interstitial edema  Patient is saturating well, 95% on 2 L nasal cannula  Continue home spiriva  VQ scan  Shows low probability for PE Hold Lasix due to hypotension   Active Problems:  Fracture of tibia, distal, right, closed  Pain not controlled, start dilaudid PCA  Followup physical therapy evaluation Diabetes mellitus type 2 with complications  A1c 7.3 slightly above goal Continue sliding scale insulin  Appreciate diabetic coordinator recommendations Anemia of chronic disease  Perhaps secondary to CHF  No signs of active bleed Hemoglobin stable Acute kidney injury  Secondary to UTI, urinary retention and Lasix  Continue to monitor renal function Lasix discontinued Obesity (BMI 30-39.9)  Nutrition consulted Chronic pain  Dilaudid PCA Hypertensive heart disease  Blood pressure on  soft side Hold all BP meds  COPD (chronic obstructive pulmonary disease)  Stable  Continue Spiriva Diastolic CHF, chronic  BNP on admission 5080  D/C lasix Strict intake and output  Daily weight Urinary retention  Secondary to chronic urethral stricture  Foley with 1.68 L in past 24 hours UTI (lower urinary tract infection)  Continue Rocephin  Followup results of urine culture  Code Status: Full code  Family Communication: Wife at bedside  Disposition Plan: Continue monitoring in step down unit   Manson Passey, MD  [facility]  Pager [phone]   Consultants:  Urology (Dr. Berneice Heinrich) - phone call only- recommended keeping foley and follow up outpatient   Procedures:  None Antibiotics:  Rocephin 2012-10-21 -->  If 7PM-7AM, please contact night-coverage [email or web address] Password [email or web address] 10/09/2012, 7:00 AM   LOS: 2 days   HPI/Subjective: Still with significant pain in back and abdomen.  Objective: Filed Vitals:   10/09/12 0000 10/09/12 0200 10/09/12 0400 10/09/12 0600  BP: 110/81 83/62 88/57  85/54  Pulse: 118 80 75 73  Temp: 97.4 F (36.3 C)  97.6 F (36.4 C)   TempSrc: Oral  Oral   Resp: 13 10 10 20   Height:      Weight:      SpO2: 93% 97% 97% 84%    Intake/Output Summary (Last 24 hours) at 10/09/12 0700 Last data filed at 10/09/12 0400  Gross per 24 hour  Intake 301.03 ml  Output   1680 ml  Net -1378.97 ml    Exam:   General:  Pt is alert, follows commands appropriately, not in acute distress  Cardiovascular: Regular rate and rhythm, S1/S2 appreciated  Respiratory: Clear to auscultation bilaterally, no wheezing, no crackles, no  rhonchi  Abdomen: Soft, distended and tender across mid abdomen, bowel sounds present, no guarding  Extremities: LE edema, pulses DP and PT palpable bilaterally  Neuro: Grossly nonfocal  Data Reviewed: Basic Metabolic Panel:  Lab 10/09/12 1610 10/08/12 0445 2012-10-22 2125  NA 135 135 132*  K 4.9 3.9 4.3  CL 98 96 95*  CO2 25 26 24    GLUCOSE 164* 85 108*  BUN 45* 42* 42*  CREATININE 2.15* 1.65* 1.56*  CALCIUM 8.0* 8.8 8.8   Liver Function Tests:  Lab 2012/10/22 2125  AST 101*  ALT 47  ALKPHOS 124*  BILITOT 1.7*  PROT 8.3  ALBUMIN 3.2*   CBC:  Lab 10/09/12 0335 10/08/12 0445 10-22-2012 2125  WBC 7.8 8.9 8.0  NEUTROABS -- -- 6.0  HGB 9.8* 10.3* 10.5*  HCT 34.0* 34.6* 34.5*  MCV 83.1 81.2 79.9  PLT 166 158 209   Cardiac Enzymes:  Lab 10/08/12 1334 10/08/12 0830 10/08/12 0241  CKTOTAL -- -- --  CKMB -- -- --  CKMBINDEX -- -- --  TROPONINI <0.30 <0.30 <0.30   BNP: No components found with this basename: POCBNP:5 CBG:  Lab 10/08/12 2213 10/08/12 1651 10/08/12 1349 10/08/12 0808 10-22-12 2102  GLUCAP 136* 106* 144* 67* 117*    Recent Results (from the past 240 hour(s))  MRSA PCR SCREENING     Status: Normal   Collection Time   10/08/12  4:21 AM      Component Value Range Status Comment   MRSA by PCR NEGATIVE  NEGATIVE Final      Studies: Nm Pulmonary Perf And Vent 10/08/2012  *  IMPRESSION: Very low probability study for pulmonary embolus.   Original Report Authenticated By: Charlett Nose, M.D.    Dg Chest Port 1 View October 22, 2012  * IMPRESSION: Vascular congestion and cardiomegaly, with minimally increased interstitial markings, compatible with recurrent mild interstitial edema.   Original Report Authenticated By: Tonia Ghent, M.D.     Scheduled Meds:   . cefTRIAXone   1 g Intravenous Q24H  . enoxaparin (LOVENOX)   1 mg/kg Subcutaneous Q12H  . insulin aspart  0-15 Units Subcutaneous TID WC  . isosorbide mononitrate  60 mg Oral Daily  . OxyCODONE  40 mg Oral Q12H  . Tamsulosin HCl  0.4 mg Oral Daily  . tiotropium  18 mcg Inhalation Daily

## 2012-10-09 NOTE — Progress Notes (Signed)
10/09/12 2200- Nsg note: Pt c/o acute abd pain with sudden burst of pain in RLE. Pt is using PCA and still reports uncontrolled pain. Pt fidgety and cannnot get comfortable in bed while falls asleep intermittently and wakes up suddenly d/t pain. Pt's jaw quivering. [facility] NP paged and in to see pt. Portable KUB ordered- pt could not tolerate laying flat for xray, so we were unable to obtain any films. Encouraged PCA and educated. Will continue to monitor.

## 2012-10-09 NOTE — Evaluation (Signed)
Physical Therapy Evaluation Patient Details Name: Terry [name] MRN: [id number] DOB: 1946-05-26 Today's Date: 10/09/2012 Time: 7829-5621 PT Time Calculation (min): 27 min  PT Assessment / Plan / Recommendation Clinical Impression  Pt. is 67 yo male admitted. 09/22/2012 with urinary retension,hypoxia. LE edema and blisters LLE. Pt. has h/o r tib/fib fx in 12/13 and was DC'd to home and not rehab. Pt has had multiple back surgeries with neuropathy. Pt. is very restless but tolerated getting up to edge of bed and stood at RW. Pt. will benefit from PT to improve in functional mobility and transfers to DC to home is pt. desire. wife present and agreed.    PT Assessment  Patient needs continued PT services    Follow Up Recommendations  Home health PT;CIR;Supervision/Assistance - 24 hour    Does the patient have the potential to tolerate intense rehabilitation      Barriers to Discharge Decreased caregiver support      Equipment Recommendations  None recommended by PT    Recommendations for Other Services OT consult   Frequency Min 3X/week    Precautions / Restrictions Precautions Precautions: Back;Fall Precaution Comments: multiple back surgeries, weeping blisters  LLE,    Pertinent Vitals/Pain Back LLE are 7/10. RN getting meds.      Mobility  Bed Mobility Bed Mobility: Supine to Sit;Sit to Supine Supine to Sit: 3: Mod assist;HOB elevated;With rails Sit to Supine: 3: Mod assist;HOB elevated;With rail Details for Bed Mobility Assistance: assistance to get legs over edge to protect blisters and open areas of LLE, asot to get legs onto bed, Transfers Transfers: Sit to Stand;Stand to Sit Sit to Stand: 1: +2 Total assist;From bed Sit to Stand: Patient Percentage: 60% Stand to Sit: To bed;1: +2 Total assist Stand to Sit: Patient Percentage: 60% Details for Transfer Assistance: pt assisted to get to a standing position.a RW. Ambulation/Gait Ambulation/Gait Assistance: 1: +2 Total  assist Ambulation/Gait: Patient Percentage: 60% Ambulation Distance (Feet): 4 Feet (sidways scooting on R foot) Assistive device: Rolling walker    Shoulder Instructions     Exercises     PT Diagnosis: Difficulty walking;Acute pain;Generalized weakness  PT Problem List: Decreased strength;Decreased range of motion;Decreased activity tolerance;Decreased mobility;Decreased safety awareness;Pain;Decreased skin integrity PT Treatment Interventions: DME instruction;Functional mobility training;Therapeutic activities;Therapeutic exercise;Patient/family education;Wheelchair mobility training   PT Goals Acute Rehab PT Goals PT Goal Formulation: With patient Time For Goal Achievement: 10/23/12 Potential to Achieve Goals: Good Pt will go Supine/Side to Sit: with supervision PT Goal: Supine/Side to Sit - Progress: Goal set today Pt will go Sit to Supine/Side: with supervision PT Goal: Sit to Supine/Side - Progress: Goal set today Pt will go Sit to Stand: with min assist PT Goal: Sit to Stand - Progress: Goal set today Pt will go Stand to Sit: with supervision PT Goal: Stand to Sit - Progress: Goal set today Pt will Transfer Bed to Chair/Chair to Bed: with min assist PT Transfer Goal: Bed to Chair/Chair to Bed - Progress: Goal set today Pt will Ambulate: 1 - 15 feet;with min assist;with rolling walker PT Goal: Ambulate - Progress: Goal set today Pt will Propel Wheelchair: 10 - 50 feet;with supervision PT Goal: Propel Wheelchair - Progress: Goal set today  Visit Information  Last PT Received On: 10/09/12 Assistance Needed: +2    Subjective Data  Subjective: i Just  can't get settled. Patient Stated Goal: I just want to stand up.   Prior Functioning  Home Living Lives With: Spouse Available Help at Discharge:  Family Type of Home: House Home Access: Stairs to enter Secretary/administrator of Steps: 1 Home Layout: One level Bathroom Toilet: Handicapped height Home Adaptive Equipment:  Bedside commode/3-in-1;Wheelchair - manual;Grab bars around toilet Prior Function Level of Independence: Needs assistance;Independent with assistive device(s) Needs Assistance: Bathing;Dressing Bath: Supervision/set-up Dressing: Supervision/set-up Transfer Assistance: Pt. was able to stand and pivot from bed to wc and to toilet w/ no assistance. Able to Take Stairs?: Yes Vocation: On disability Communication Communication: No difficulties    Cognition  Overall Cognitive Status: Appears within functional limits for tasks assessed/performed Arousal/Alertness: Awake/alert Orientation Level: Appears intact for tasks assessed Behavior During Session: Restless    Extremity/Trunk Assessment Right Upper Extremity Assessment RUE ROM/Strength/Tone: WFL for tasks assessed Left Upper Extremity Assessment LUE ROM/Strength/Tone: WFL for tasks assessed Right Lower Extremity Assessment RLE ROM/Strength/Tone: Deficits RLE ROM/Strength/Tone Deficits: grossly WFL to stand  and shimmy sideways along bed  RLE Sensation: History of peripheral neuropathy Left Lower Extremity Assessment LLE ROM/Strength/Tone: Deficits LLE ROM/Strength/Tone Deficits: able to bear some weight bu less due to pain LLE Sensation: History of peripheral neuropathy Trunk Assessment Trunk Assessment:  (curved, multiple back surgeries)   Balance Static Sitting Balance Static Sitting - Balance Support: No upper extremity supported Static Sitting - Level of Assistance: 5: Stand by assistance  End of Session PT - End of Session Activity Tolerance: Patient limited by fatigue;Patient limited by pain Patient left: in bed;with call bell/phone within reach;with family/visitor present Nurse Communication: Mobility status  GP     Rada Hay 10/09/2012, 3:59 PM  [phone]

## 2012-10-10 ENCOUNTER — Inpatient Hospital Stay (HOSPITAL_COMMUNITY): Payer: 59

## 2012-10-10 DIAGNOSIS — R339 Retention of urine, unspecified: Secondary | ICD-10-CM

## 2012-10-10 LAB — BASIC METABOLIC PANEL
CO2: 27 mEq/L (ref 19–32)
Chloride: 100 mEq/L (ref 96–112)
GFR calc Af Amer: 35 mL/min — ABNORMAL LOW (ref >90)
Potassium: 4.5 mEq/L (ref 3.5–5.1)
Sodium: 138 mEq/L (ref 135–145)

## 2012-10-10 LAB — GLUCOSE, CAPILLARY
Glucose-Capillary: 119 mg/dL — ABNORMAL HIGH (ref 70–99)
Glucose-Capillary: 194 mg/dL — ABNORMAL HIGH (ref 70–99)

## 2012-10-10 LAB — CBC
Platelets: 165 10*3/uL (ref 150–400)
RBC: 3.88 MIL/uL — ABNORMAL LOW (ref 4.22–5.81)
RDW: 23.5 % — ABNORMAL HIGH (ref 11.5–15.5)
WBC: 7.2 10*3/uL (ref 4.0–10.5)

## 2012-10-10 MED ORDER — BIOTENE DRY MOUTH MT LIQD
15.0000 mL | Freq: Two times a day (BID) | OROMUCOSAL | Status: DC
  Administered 2012-10-10 – 2012-10-22 (×21): 15 mL via OROMUCOSAL

## 2012-10-10 MED ORDER — MORPHINE SULFATE (PF) 1 MG/ML IV SOLN
INTRAVENOUS | Status: DC
  Administered 2012-10-10: 3 mg via INTRAVENOUS
  Administered 2012-10-10: 4.5 mg via INTRAVENOUS
  Administered 2012-10-10: 30 mg via INTRAVENOUS
  Administered 2012-10-10: 9 mg via INTRAVENOUS
  Administered 2012-10-10: 22:00:00 via INTRAVENOUS
  Administered 2012-10-11: 6 mg via INTRAVENOUS
  Administered 2012-10-11: 10.5 mg via INTRAVENOUS
  Administered 2012-10-11: 4.5 mg via INTRAVENOUS
  Administered 2012-10-11: 7.5 mg via INTRAVENOUS
  Administered 2012-10-11: 9 mg via INTRAVENOUS
  Administered 2012-10-12: 25 mg via INTRAVENOUS
  Administered 2012-10-12: 1.5 mg via INTRAVENOUS
  Filled 2012-10-10 (×4): qty 25

## 2012-10-10 MED ORDER — NALOXONE HCL 0.4 MG/ML IJ SOLN: 0.4000 mg | INTRAMUSCULAR | Status: DC | PRN

## 2012-10-10 MED ORDER — SODIUM CHLORIDE 0.9 % IJ SOLN
10.0000 mL | Freq: Two times a day (BID) | INTRAMUSCULAR | Status: DC
  Administered 2012-10-10 – 2012-10-12 (×5): 10 mL
  Administered 2012-10-13 – 2012-10-14 (×2): 20 mL
  Administered 2012-10-16 – 2012-10-18 (×2): 10 mL
  Administered 2012-10-19: 3 mL
  Administered 2012-10-20 – 2012-10-22 (×4): 10 mL
  Administered 2012-10-23: 30 mL
  Administered 2012-10-24: 20 mL
  Administered 2012-10-24: 10 mL

## 2012-10-10 MED ORDER — BISACODYL 10 MG RE SUPP
10.0000 mg | Freq: Every day | RECTAL | Status: DC
  Administered 2012-10-10 – 2012-10-20 (×7): 10 mg via RECTAL
  Filled 2012-10-10 (×8): qty 1

## 2012-10-10 MED ORDER — ONDANSETRON HCL 4 MG/2ML IJ SOLN: 4.0000 mg | Freq: Four times a day (QID) | INTRAMUSCULAR | Status: DC | PRN

## 2012-10-10 MED ORDER — SODIUM CHLORIDE 0.9 % IJ SOLN
10.0000 mL | INTRAMUSCULAR | Status: DC | PRN
  Administered 2012-10-12 – 2012-10-21 (×4): 10 mL

## 2012-10-10 MED ORDER — DIPHENHYDRAMINE HCL 12.5 MG/5ML PO ELIX: 12.5000 mg | ORAL_SOLUTION | Freq: Four times a day (QID) | ORAL | Status: DC | PRN

## 2012-10-10 MED ORDER — DIPHENHYDRAMINE HCL 50 MG/ML IJ SOLN: 12.5000 mg | Freq: Four times a day (QID) | INTRAMUSCULAR | Status: DC | PRN

## 2012-10-10 MED ORDER — SODIUM CHLORIDE 0.9 % IJ SOLN: 9.0000 mL | INTRAMUSCULAR | Status: DC | PRN

## 2012-10-10 MED ORDER — POLYETHYLENE GLYCOL 3350 17 G PO PACK
17.0000 g | PACK | Freq: Four times a day (QID) | ORAL | Status: DC
  Administered 2012-10-10 – 2012-10-13 (×7): 17 g via ORAL
  Filled 2012-10-10 (×15): qty 1

## 2012-10-10 MED ORDER — DOCUSATE SODIUM 100 MG PO CAPS
100.0000 mg | ORAL_CAPSULE | Freq: Two times a day (BID) | ORAL | Status: DC
  Administered 2012-10-10 – 2012-10-22 (×24): 100 mg via ORAL
  Filled 2012-10-10 (×29): qty 1

## 2012-10-10 MED ORDER — HYDRALAZINE HCL 20 MG/ML IJ SOLN
10.0000 mg | Freq: Four times a day (QID) | INTRAMUSCULAR | Status: DC | PRN
  Administered 2012-10-11 – 2012-10-12 (×2): 10 mg via INTRAVENOUS
  Filled 2012-10-10 (×5): qty 1

## 2012-10-10 MED ORDER — MORPHINE SULFATE 2 MG/ML IJ SOLN
1.0000 mg | Freq: Four times a day (QID) | INTRAMUSCULAR | Status: DC | PRN
  Administered 2012-10-11 – 2012-10-15 (×10): 2 mg via INTRAVENOUS
  Filled 2012-10-10 (×12): qty 1

## 2012-10-10 MED ORDER — DEXTROSE 5 % IV SOLN
500.0000 mg | Freq: Three times a day (TID) | INTRAVENOUS | Status: DC
  Administered 2012-10-10 – 2012-10-20 (×30): 500 mg via INTRAVENOUS
  Filled 2012-10-10 (×36): qty 5

## 2012-10-10 NOTE — Progress Notes (Signed)
Inpatient Diabetes Program Recommendations  AACE/ADA: New Consensus Statement on Inpatient Glycemic Control (2013)  Target Ranges:  Prepandial:   less than 140 mg/dL      Peak postprandial:   less than 180 mg/dL (1-2 hours)      Critically ill patients:  140 - 180 mg/dL   Reason for Visit: Referral received.  CBG's are well controlled.  No recommendations at this time.      Note: Will follow.

## 2012-10-10 NOTE — Progress Notes (Signed)
Subjective:  1 - Urinary Retention / Urethral Stricture - Pt seen today at request of hospitalist service in f/u of above.   HISTORY: I saw pt at prior hospitalization after hip replacement for difficult foley where he was found to have a high-grade penile stricture for which I performed bedside balloon dilation and catheter placement 08/17/2012. He subsequently passed trial of void that hospitalization and was sent to rehab catheter free. He was counseled about the possible of stricture recurrence and GU f/u arranged.  I have not seen pt since then until today.  In the interval he aparantly wound up in urinary retention again and had another dilation performed by my colleague Dr. Brunilda Payor on 09/27/12 and a new catheter was placed. He underwent a repeat trial of void in my office under the care of an NP which he did not pass and had catheter replaced. Despite replacement he remained without much urine output and was admitted presently for this as well as COPD exacerbation among other chronic problems.  The pt had catheter replaced per report upon admission and it has been functioning well since.  I became notified by Dr. Elisabeth Pigeon earlier this week that the pt was in house and I informed her that as long as catheter was now working well there would be no further GU intervention warranted this admission as he would require urodynamics as an outpatient to verify preserved bladder function and formal GU consultation was not sought. I became aware of another request for GU evaluation today by my on-call partner and I agreed to see the patient.  PRESENT: I explained to pt today the DDX of his retention as possibly from stricture alone v. An element of hypocontractile bladder (as he had now failed several voiding trials despite patent urethra) or multifactorial with need to determine if bladder still functional before any long-term management plans could be formulated. I explained that we would want to verify the  bladder worked sufficiently before subjecting him to the risk of any definitive stricture surgery such as urethroplasty and that if his bladder did not have preserved function he would need some sort of diversion (foley, SP, or other).   Pt and his wife very upset and combative stating I had neglected to see them and that I had caused their acute problem. I tried to very calmly explain that I had indeed recommended continued catheter while in house and recommended outpatient follow-up several days ago but in no means had refused or neglected to see him. I also specifically stated that I had come today to evaluate him personally as a service to him and my on-call colleagues to better provide continuity of care. The pt and wife became increasingly agitated and asked that I leave. I explained that I was happy to see him at anypoint in the future or refer to other providers if they wished. They then threatened to personally defame me and my practice with the patient's wife repeatedly saying " the internet is going to hear about this" and banging on her computer . I again politely offered any immediate services or future coordination, and gently left the room as they wished.    Objective: Vital signs in last 24 hours: Temp:  [96.8 F (36 C)-98 F (36.7 C)] 96.8 F (36 C) (01/24 1600) Pulse Rate:  [86-121] 105  (01/24 1600) Resp:  [13-27] 21  (01/24 1600) BP: (111-173)/(56-95) 123/56 mmHg (01/24 1600) SpO2:  [90 %-100 %] 95 % (01/24 1600) Weight:  [110.859 kg (  244 lb 6.4 oz)] 110.859 kg (244 lb 6.4 oz) (01/24 0400) Last BM Date: 10/08/12  Intake/Output from previous day: 01/23 0701 - 01/24 0700 In: 825 [I.V.:775; IV Piggyback:50] Out: 577 [Urine:577] Intake/Output this shift: Total I/O In: 790 [P.O.:240; I.V.:500; IV Piggyback:50] Out: 700 [Urine:700]  General appearance: alert, appears older than stated age and wife at bedside Head: Normocephalic, without obvious abnormality, atraumatic Eyes:  conjunctivae/corneas clear. PERRL, EOM's intact. Fundi benign. Ears: normal TM's and external ear canals both ears Nose: Nares normal. Septum midline. Mucosa normal. No drainage or sinus tenderness. Throat: lips, mucosa, and tongue normal; teeth and gums normal Resp: wheezes diffusely Male genitalia: mild foreskin edema. Foely c/d/i with clear yellow urine.  Extremities: Severe LE venous stasis ulcers and woody edema. Neurologic: Mental status: Alert, Oriented, Agitated  Lab Results:   Basename 10/10/12 0645 10/09/12 2240  WBC 7.2 8.0  HGB 9.6* 9.5*  HCT 32.1* 32.4*  PLT 165 170   BMET  Basename 10/10/12 0645 10/09/12 2240  NA 138 133*  K 4.5 4.4  CL 100 96  CO2 27 28  GLUCOSE 134* 113*  BUN 47* 48*  CREATININE 2.15* 2.31*  CALCIUM 8.2* 8.0*   PT/INR No results found for this basename: LABPROT:2,INR:2 in the last 72 hours ABG  Basename 10/13/2012 2347  PHART 7.372  HCO3 24.5*    Studies/Results: Dg Knee 1-2 Views Left  10/10/2012  *RADIOLOGY REPORT*  Clinical Data: Pain without trauma  LEFT KNEE - 1-2 VIEW  Comparison: None.  Findings: Extensive femoropopliteal arterial calcifications.  No definite effusion. Negative for fracture, dislocation, or other acute abnormality.  Normal alignment and mineralization. No significant degenerative change.  Regional soft tissues unremarkable.  IMPRESSION:  Negative   Original Report Authenticated By: D. Andria Rhein, MD    Dg Knee 1-2 Views Right  10/10/2012  *RADIOLOGY REPORT*  Clinical Data: Pain without trauma  RIGHT KNEE - 1-2 VIEW  Comparison: 08/17/2012 and earlier studies  Findings: IM rod with two proximal interlocking screws in the tibial shaft is noted, distal aspect not visualized. Moderate femoropopliteal arterial calcifications.  No effusion.  Negative for fracture, dislocation, or other acute bony abnormality.  Normal mineralization and alignment.  IMPRESSION:  1.  No acute bony abnormality   Original Report Authenticated By:  D. Andria Rhein, MD    Dg Abd Portable 1v  10/10/2012  *RADIOLOGY REPORT*  Clinical Data: Right lower quadrant pain.  PORTABLE ABDOMEN - 1 VIEW  Comparison: None  Findings: 3 supine views.  These are  degraded by patient positioning and clinical status.  No gross free intraperitoneal air.  Large amount of stool within the right-sided colon.  No small bowel distention. Distal gas and stool.  Bilateral hip arthroplasties.  Cardiomegaly.  Marked lumbar spondylosis.  IMPRESSION: Possible constipation.  Mild to moderately degraded exam.  No other definite findings identified.   Original Report Authenticated By: Jeronimo Greaves, M.D.     Anti-infectives: Anti-infectives     Start     Dose/Rate Route Frequency Ordered Stop   10/08/12 0215   cefTRIAXone (ROCEPHIN) 1 g in dextrose 5 % 50 mL IVPB        1 g 100 mL/hr over 30 Minutes Intravenous Every 24 hours 10/08/12 0207     09/18/2012 2245   cefTRIAXone (ROCEPHIN) 1 g in dextrose 5 % 50 mL IVPB        1 g 100 mL/hr over 30 Minutes Intravenous  Once 10/11/2012 2234 10/08/12 0017  Assessment/Plan:  1 - Urinary Retention / Urethral Stricture - Continue foley in house. I have requested outpatient urodynamics at our office as per above. I am happy so see patient again if they desire or can make referrals as per their wishes.  [facility], Terry [name] 10/10/2012

## 2012-10-10 NOTE — Significant Event (Signed)
Pt noted to have oxygen desaturations while asleep.  High risk for OSA.  Will try empiric auto CPAP.  Coralyn Helling, MD 10/10/2012, 3:51 AM

## 2012-10-10 NOTE — Progress Notes (Signed)
Patient continues to have periods of desaturations during sleep. However, he does not tolerate CPAP. Wife at bedside aware of intolerance and patient feels as if he is "sufocating". RN also aware.

## 2012-10-10 NOTE — Progress Notes (Signed)
Peripherally Inserted Central Catheter/Midline Placement  The IV Nurse has discussed with the patient and/or persons authorized to consent for the patient, the purpose of this procedure and the potential benefits and risks involved with this procedure.  The benefits include less needle sticks, lab draws from the catheter and patient may be discharged home with the catheter.  Risks include, but not limited to, infection, bleeding, blood clot (thrombus formation), and puncture of an artery; nerve damage and irregular heat beat.  Alternatives to this procedure were also discussed.  PICC/Midline Placement Documentation        Terry [name] 10/10/2012, 3:49 PM

## 2012-10-10 NOTE — Progress Notes (Addendum)
[facility] PROGRESS NOTE  CARRICK RIJOS ZOX:[id number] DOB: Oct 01, 1945 DOA: 10/13/2012 PCP: No primary provider on file.  Brief narrative: 67 -year-old male with multiple medical comorbidities including but not limited to COPD, diastolic CHF, chronic urethral stricture and urinary retention, recent hospitalization for right tibia, fibula fracture, obesity who presented 10-13-2012 with urinary retention. Patient had a Foley catheter changed by urology one day prior to this admission but has not had urine output.  In ED, Foley catheter was changed and 800 cc clear urine drained. Patient was found to be hypoxic in ED with generalized swelling. He was further admitted for evaluation of hypoxia. VQ scan shows low probability for pulmonary embolism. Doppler study negative for DVT. He was started on Lasix in ED for mild interstitial edema based on chest x-ray. In addition, creatinine was 1.65 on this admission. Lasix was subsequently discontinued due to development of hypotension and worsening kidney function. At this time, patient continues to experience significant abdominal pain. Abdominal x-ray done 10/10/2012 shows constipation. We have increased bowel regimen to include Colace, MiraLAX and bisacodyl. We will also obtain abdominal ultrasound for further evaluation. I spoke with the patient in regards to CT abdomen without contrast for further evaluation of abdominal pain but he wanted to defer this until he is able to lie flat for a longer time. Patient also complained of bilateral knee pain so we have obtained x-ray of both knees.  Assessment/Plan:   Principal Problem:  *Acute respiratory failure with hypoxia  Likely secondary to COPD exacerbation versus mild interstitial edema  Respiratory status remains stable at this time Continue home spiriva  VQ scan Shows low probability for PE; or extremity Dopplers negative for DVT Lasix is still on hold secondary to hypotension  Active Problems:    Fracture of tibia, distal, right, closed  Patient still complains of pain in lower extremities. Dilaudid PCA is not working for patient's so we will switch to morphine PCA and see if he can get more pain relief Followup physical therapy evaluation Bilateral knee pain  Followup x-ray of both knees Abdominal distention, pain  Followup abdominal ultrasound  Based on abdominal x-ray patient is constipated. We will start Colace, MiraLAX and bisacodyl Diabetes mellitus type 2 with complications  A1c 7.3 slightly above goal  Continue sliding scale insulin  Appreciate diabetic coordinator recommendations Anemia of chronic disease  Perhaps secondary to CHF  No signs of active bleed  Hemoglobin stable Acute kidney injury  Secondary to UTI, urinary retention and Lasix  Continue to monitor renal function; creatinine trending down Lasix discontinued Obesity (BMI 30-39.9)  Nutrition consulted Chronic pain  Morphine PCA Hypertensive heart disease  Blood pressure medications on hold Blood pressure 130/74 COPD (chronic obstructive pulmonary disease)  Stable  Continue Spiriva Diastolic CHF, chronic  BNP on admission 5080  Continue intake and output measurement Lasix on hold due to acute kidney injury Urinary retention  Secondary to chronic urethral stricture  Appreciate urology consult UTI (lower urinary tract infection)  Continue Rocephin  Urine cultures were not obtained at the time of admission and since patient is already on Rocephin urine culture would most likely be negative   Code Status: Full code  Family Communication: Wife at bedside  Disposition Plan: Continue monitoring in step down unit   Manson Passey, MD  [facility]  Pager [phone]   Consultants:  Urology (Dr. Berneice Heinrich) - phone call only- recommended keeping foley and follow up outpatient  Procedures:  None Antibiotics:  Rocephin 10-13-2012 -->  If  7PM-7AM, please contact night-coverage [email or web address] Password  [email or web address] 10/10/2012, 1:05 PM   LOS: 3 days   HPI/Subjective: No acute overnight events.  Objective: Filed Vitals:   10/10/12 0911 10/10/12 0929 10/10/12 1000 10/10/12 1204  BP:      Pulse:   98   Temp:      TempSrc:      Resp: 27  23 17   Height:      Weight:      SpO2: 95% 91% 95% 95%    Intake/Output Summary (Last 24 hours) at 10/10/12 1305 Last data filed at 10/10/12 0600  Gross per 24 hour  Intake    775 ml  Output    517 ml  Net    258 ml    Exam:   General:  Pt is alert, follows commands appropriately, not in acute distress  Cardiovascular: Regular rate and rhythm, S1/S2, no murmurs, no rubs, no gallops  Respiratory: Clear to auscultation bilaterally, no wheezing, no crackles, no rhonchi  Abdomen: Soft, non tender, non distended, bowel sounds present, no guarding  Extremities: No edema, pulses DP and PT palpable bilaterally  Neuro: Grossly nonfocal  Data Reviewed: Basic Metabolic Panel:  Lab 10/10/12 1610 10/09/12 2240 10/09/12 0335 10/08/12 0445 10/09/2012 2125  NA 138 133* 135 135 132*  K 4.5 4.4 4.9 3.9 4.3  CL 100 96 98 96 95*  CO2 27 28 25 26 24   GLUCOSE 134* 113* 164* 85 108*  BUN 47* 48* 45* 42* 42*  CREATININE 2.15* 2.31* 2.15* 1.65* 1.56*  CALCIUM 8.2* 8.0* 8.0* 8.8 8.8   Liver Function Tests:  Lab 10/09/12 2240 09/24/2012 2125  AST 44* 101*  ALT 35 47  ALKPHOS 109 124*  BILITOT 0.6 1.7*  PROT 7.2 8.3  ALBUMIN 2.6* 3.2*   CBC:  Lab 10/10/12 0645 10/09/12 2240 10/09/12 0335 10/08/12 0445 09/25/2012 2125  WBC 7.2 8.0 7.8 8.9 8.0  HGB 9.6* 9.5* 9.8* 10.3* 10.5*  HCT 32.1* 32.4* 34.0* 34.6* 34.5*  MCV 82.7 82.9 83.1 81.2 79.9  PLT 165 170 166 158 209   Cardiac Enzymes:  Lab 10/08/12 1334 10/08/12 0830 10/08/12 0241  TROPONINI <0.30 <0.30 <0.30   CBG:  Lab 10/10/12 1209 10/10/12 0741 10/09/12 1706 10/09/12 1131 10/09/12 0752  GLUCAP 194* 119* 146* 83 155*    Recent Results (from the past 240 hour(s))  MRSA PCR SCREENING     Status:  Normal   Collection Time   10/08/12  4:21 AM      Component Value Range Status Comment   MRSA by PCR NEGATIVE  NEGATIVE Final      Studies: Dg Abd Portable 1v 10/10/2012  * IMPRESSION: Possible constipation.  Mild to moderately degraded exam.  No other definite findings identified.      Scheduled Meds:   . bisacodyl  10 mg Rectal Daily  . cefTRIAXone   1 g Intravenous Q24H  . docusate sodium  100 mg Oral BID  . enoxaparin (LOVENOX)   40 mg Subcutaneous Q24H  . insulin aspart  0-15 Units Subcutaneous TID WC  . isosorbide mononitrate  60 mg Oral Daily  . methocarbamol   500 mg Intravenous Q8H  . morphine   Intravenous Q4H  . OxyCODONE  40 mg Oral Q12H  . polyethylene glycol  17 g Oral QID  . Tamsulosin HCl  0.4 mg Oral Daily  . tiotropium  18 mcg Inhalation Daily

## 2012-10-11 ENCOUNTER — Inpatient Hospital Stay (HOSPITAL_COMMUNITY): Payer: 59

## 2012-10-11 LAB — CBC WITH DIFFERENTIAL/PLATELET
Basophils Absolute: 0 10*3/uL (ref 0.0–0.1)
Eosinophils Absolute: 0 10*3/uL (ref 0.0–0.7)
Eosinophils Relative: 0 % (ref 0–5)
MCH: 24.1 pg — ABNORMAL LOW (ref 26.0–34.0)
Monocytes Absolute: 0.9 10*3/uL (ref 0.1–1.0)
Neutrophils Relative %: 74 % (ref 43–77)
Platelets: 175 10*3/uL (ref 150–400)
RBC: 3.74 MIL/uL — ABNORMAL LOW (ref 4.22–5.81)
RDW: 23.6 % — ABNORMAL HIGH (ref 11.5–15.5)

## 2012-10-11 LAB — BASIC METABOLIC PANEL
CO2: 28 mEq/L (ref 19–32)
Calcium: 8.3 mg/dL — ABNORMAL LOW (ref 8.4–10.5)
GFR calc Af Amer: 48 mL/min — ABNORMAL LOW (ref >90)
GFR calc non Af Amer: 42 mL/min — ABNORMAL LOW (ref >90)
Sodium: 136 mEq/L (ref 135–145)

## 2012-10-11 LAB — GLUCOSE, CAPILLARY
Glucose-Capillary: 104 mg/dL — ABNORMAL HIGH (ref 70–99)
Glucose-Capillary: 108 mg/dL — ABNORMAL HIGH (ref 70–99)

## 2012-10-11 MED ORDER — DILTIAZEM HCL 60 MG PO TABS
60.0000 mg | ORAL_TABLET | Freq: Three times a day (TID) | ORAL | Status: DC
Start: 2012-10-11 — End: 2012-10-12
  Administered 2012-10-11 – 2012-10-12 (×4): 60 mg via ORAL
  Filled 2012-10-11 (×7): qty 1

## 2012-10-11 MED ORDER — PEG 3350-KCL-NA BICARB-NACL 420 G PO SOLR
4000.0000 mL | Freq: Once | ORAL | Status: AC
  Administered 2012-10-11: 4000 mL via ORAL
  Filled 2012-10-11: qty 4000

## 2012-10-11 NOTE — Progress Notes (Signed)
Patient refuses CPAP because he feels like he is smothering. Tried CPAP a couple of days ago and could not tolerate per pt and patients spouse.

## 2012-10-11 NOTE — Progress Notes (Signed)
[facility] PROGRESS NOTE  COSTANTINO KOHLBECK VHQ:[id number] DOB: 07/24/1946 DOA: 10/13/2012 PCP: No primary provider on file.  Brief narrative: 67 -year-old male with multiple medical comorbidities including but not limited to COPD, diastolic CHF, chronic urethral stricture and urinary retention, recent hospitalization for right tibia, fibula fracture, obesity who presented 10/16/2012 with urinary retention. Patient had a Foley catheter changed by urology one day prior to this admission but has not had urine output.  In ED, Foley catheter was changed and 800 cc clear urine drained. Patient was found to be hypoxic in ED with generalized swelling. He was further admitted for evaluation of hypoxia. VQ scan shows low probability for pulmonary embolism. Doppler study negative for DVT. He was started on Lasix in ED for mild interstitial edema based on chest x-ray. In addition, creatinine was 1.65 on this admission. Lasix was subsequently discontinued due to development of hypotension and worsening kidney function.  Patient continues to experience significant abdominal pain. Abdominal x-ray done 10/10/2012 shows possible constipation. We have increased bowel regimen to include Colace, MiraLAX and bisacodyl. Patient has refused suppository but eventually did have only small bowel movement. I have encouraged him to be compliant with our recommendations. Abdominal ultrasound results pending. Patient was switched from dilaudid PCA to morphine PCA and is on an extensive pain med regimen due to severe abdominal pain.   Assessment/Plan:   Principal Problem:  *Acute respiratory failure with hypoxia  Likely secondary to COPD exacerbation versus mild interstitial edema  Respiratory status remains stable at this time  Continue home spiriva  VQ scan Shows low probability for PE; or extremity Dopplers negative for DVT  Lasix on hold due to acute kidney injury  Active Problems:  Fracture of tibia, distal, right,  closed  Switched to morphine PCA (from dilaudid PCA) with morphine boluses. This seems to help control pain better. Patient is additionally on oxycodone 40 mg Q 12 hours scheduled, robaxin 500 mg Q 8 hours for muscle spasms Followup physical therapy evaluation Bilateral knee pain  Followup x-ray of both knees - negative for effusions or disslocations Abdominal distention, pain  Followup abdominal ultrasound  Based on abdominal x-ray patient is constipated. Started Colace, MiraLAX and bisacodyl Only had 1 small BM; we will give Golytely to get better BM and see if abdominal distention improves Diabetes mellitus type 2 with complications  A1c 7.3 slightly above goal  Continue sliding scale insulin  Appreciate diabetic coordinator recommendations Anemia of chronic disease  Perhaps secondary to CHF  No signs of active bleed  Hemoglobin stable Acute kidney injury  Secondary to UTI, urinary retention and Lasix  Trending down, 1.64 today Hold lasix Obesity (BMI 30-39.9)  Nutrition consulted Chronic pain  Morphine PCA Hypertensive heart disease  Added Cardizem 60 mg Q 8 hours to help with HR Continue Imdur 60 mg daily Hydralazine 10 mg IV PRN Q 6 hours COPD (chronic obstructive pulmonary disease)  Stable  Continue Spiriva Diastolic CHF, chronic  BNP on admission 5080  Continue intake and output measurement  Lasix on hold due to acute kidney injury Urinary retention  Secondary to chronic urethral stricture  Appreciate urology consultation UTI (lower urinary tract infection)  Continue Rocephin  Urine cultures were not obtained at the time of admission and since patient is already on Rocephin urine culture would most likely be negative   Code Status: Full code  Family Communication: Wife at bedside  Disposition Plan: transfer to telemetry   Manson Passey, MD  [facility]  Pager [phone]  Consultants:  Urology (Dr. Berneice Heinrich) - phone call only- recommended keeping foley and follow up  outpatient  Procedures:  None Antibiotics:  Rocephin Nov 03, 2012 -->  If 7PM-7AM, please contact night-coverage [email or web address] Password [facility] 10/11/2012, 9:41 AM   LOS: 4 days   HPI/Subjective: Still in pain.  Objective: Filed Vitals:   10/11/12 0400 10/11/12 0600 10/11/12 0800 10/11/12 0917  BP: 165/95 148/75 176/94   Pulse: 123 122 117   Temp: 96.8 F (36 C)  97.6 F (36.4 C)   TempSrc: Axillary  Axillary   Resp: 16 13 12    Height:      Weight: 114.261 kg (251 lb 14.4 oz)     SpO2: 95% 95% 92% 94%    Intake/Output Summary (Last 24 hours) at 10/11/12 0941 Last data filed at 10/11/12 0700  Gross per 24 hour  Intake   1500 ml  Output   1020 ml  Net    480 ml    Exam:   General:  Pt is alert, follows commands appropriately, not in acute distress  Cardiovascular: Regular rate and rhythm, S1/S2, no murmurs, no rubs, no gallops  Respiratory: Clear to auscultation bilaterally, no wheezing, no crackles, no rhonchi  Abdomen: distended with tenderness diffusely, bowel sounds present, no guarding  Extremities: No edema, pulses DP and PT palpable bilaterally; lower extremity blisters  Neuro: Grossly nonfocal  Data Reviewed: Basic Metabolic Panel:  Lab 10/11/12 0454 10/10/12 0645 10/09/12 2240 10/09/12 0335 10/08/12 0445  NA 136 138 133* 135 135  K 4.2 4.5 4.4 4.9 3.9  CL 99 100 96 98 96  CO2 28 27 28 25 26   GLUCOSE 121* 134* 113* 164* 85  BUN 37* 47* 48* 45* 42*  CREATININE 1.64* 2.15* 2.31* 2.15* 1.65*  CALCIUM 8.3* 8.2* 8.0* 8.0* 8.8  MG -- -- -- -- --  PHOS -- -- -- -- --   Liver Function Tests:  Lab 10/09/12 2240 11-03-2012 2125  AST 44* 101*  ALT 35 47  ALKPHOS 109 124*  BILITOT 0.6 1.7*  PROT 7.2 8.3  ALBUMIN 2.6* 3.2*   CBC:  Lab 10/11/12 0425 10/10/12 0645 10/09/12 2240 10/09/12 0335 10/08/12 0445 11/03/2012 2125  WBC 7.7 7.2 8.0 7.8 8.9 --  NEUTROABS 5.7 -- -- -- -- 6.0  HGB 9.0* 9.6* 9.5* 9.8* 10.3* --  HCT 30.8* 32.1* 32.4* 34.0* 34.6* --    MCV 82.4 82.7 82.9 83.1 81.2 --  PLT 175 165 170 166 158 --   Cardiac Enzymes:  Lab 10/08/12 1334 10/08/12 0830 10/08/12 0241  CKTOTAL -- -- --  CKMB -- -- --  CKMBINDEX -- -- --  TROPONINI <0.30 <0.30 <0.30   CBG:  Lab 10/11/12 0759 10/10/12 1716 10/10/12 1209 10/10/12 0741 10/09/12 1706  GLUCAP 104* 153* 194* 119* 146*    Recent Results (from the past 240 hour(s))  MRSA PCR SCREENING     Status: Normal   Collection Time   10/08/12  4:21 AM      Component Value Range Status Comment   MRSA by PCR NEGATIVE  NEGATIVE Final      Studies: Dg Knee 1-2 Views Left 10/10/2012  * IMPRESSION:  Negative   Original Report Authenticated By: D. Andria Rhein, MD    Dg Knee 1-2 Views Right 10/10/2012  *  IMPRESSION:  1.  No acute bony abnormality   Original Report Authenticated By: D. Andria Rhein, MD    Dg Abd Portable 1v 10/10/2012  * IMPRESSION: Possible constipation.  Mild to  moderately degraded exam.  No other definite findings identified.   Original Report Authenticated By: Jeronimo Greaves, M.D.     Scheduled Meds:  . bisacodyl  10 mg Rectal Daily  . cefTRIAXone   1 g Intravenous Q24H  . diltiazem  60 mg Oral Q8H  . docusate sodium  100 mg Oral BID  . enoxaparin (LOVENOX)   40 mg Subcutaneous Q24H  . insulin aspart  0-15 Units Subcutaneous TID WC  . isosorbide mononitrate  60 mg Oral Daily  . (ROBAXIN) IV  500 mg Intravenous Q8H  . morphine   Intravenous Q4H  . OxyCODONE  40 mg Oral Q12H  . polyethylene glycol  17 g Oral QID  . polyethylene glycol-electrolytes  4,000 mL Oral Once  . Tamsulosin HCl  0.4 mg Oral Daily  . tiotropium  18 mcg Inhalation Daily

## 2012-10-11 NOTE — Progress Notes (Signed)
Patients sats are in the mid to lower 80's when sleeping. Patient continues to refuse CPAP. Placed patient on Venti mask due to being a mouth breather at 26% on 3 LPM. Sats continued to stay the same ,so Venti mask was increased to 28% on 6 LPM. Sats are now between 88 and 90%. Will continue to monitor.

## 2012-10-12 ENCOUNTER — Encounter (HOSPITAL_COMMUNITY): Payer: Self-pay | Admitting: Radiology

## 2012-10-12 ENCOUNTER — Inpatient Hospital Stay (HOSPITAL_COMMUNITY): Payer: 59

## 2012-10-12 DIAGNOSIS — G8929 Other chronic pain: Secondary | ICD-10-CM

## 2012-10-12 LAB — BASIC METABOLIC PANEL
BUN: 30 mg/dL — ABNORMAL HIGH (ref 6–23)
Calcium: 8.4 mg/dL (ref 8.4–10.5)
Creatinine, Ser: 1.26 mg/dL (ref 0.50–1.35)
GFR calc Af Amer: 66 mL/min — ABNORMAL LOW (ref >90)
GFR calc non Af Amer: 57 mL/min — ABNORMAL LOW (ref >90)
Glucose, Bld: 106 mg/dL — ABNORMAL HIGH (ref 70–99)

## 2012-10-12 LAB — CBC
MCH: 24.3 pg — ABNORMAL LOW (ref 26.0–34.0)
MCHC: 29.7 g/dL — ABNORMAL LOW (ref 30.0–36.0)
RDW: 24 % — ABNORMAL HIGH (ref 11.5–15.5)

## 2012-10-12 LAB — GLUCOSE, CAPILLARY: Glucose-Capillary: 99 mg/dL (ref 70–99)

## 2012-10-12 MED ORDER — IOHEXOL 300 MG/ML  SOLN
50.0000 mL | Freq: Once | INTRAMUSCULAR | Status: AC | PRN
  Administered 2012-10-12: 50 mL via ORAL

## 2012-10-12 MED ORDER — DILTIAZEM HCL 60 MG PO TABS
60.0000 mg | ORAL_TABLET | Freq: Four times a day (QID) | ORAL | Status: DC
  Administered 2012-10-12 – 2012-10-15 (×12): 60 mg via ORAL
  Filled 2012-10-12 (×17): qty 1

## 2012-10-12 MED ORDER — FUROSEMIDE 10 MG/ML IJ SOLN
20.0000 mg | Freq: Once | INTRAMUSCULAR | Status: AC
  Administered 2012-10-12: 20 mg via INTRAVENOUS
  Filled 2012-10-12: qty 2

## 2012-10-12 MED ORDER — OXYCODONE HCL ER 20 MG PO T12A
20.0000 mg | EXTENDED_RELEASE_TABLET | Freq: Two times a day (BID) | ORAL | Status: DC
  Administered 2012-10-12: 20 mg via ORAL

## 2012-10-12 MED ORDER — TRAMADOL HCL 50 MG PO TABS
50.0000 mg | ORAL_TABLET | Freq: Four times a day (QID) | ORAL | Status: DC | PRN
  Administered 2012-10-12 – 2012-10-22 (×22): 50 mg via ORAL
  Filled 2012-10-12 (×23): qty 1

## 2012-10-12 NOTE — Progress Notes (Signed)
[facility] PROGRESS NOTE  Terry [name] GNF:[id number] DOB: 1946-02-26 DOA: 10/06/2012 PCP: No primary provider on file.  Brief narrative: 67 -year-old male with multiple medical comorbidities including but not limited to COPD, diastolic CHF, chronic urethral stricture and urinary retention, recent hospitalization for right tibia, fibula fracture, obesity who presented 10/01/2012 with urinary retention. Patient had a Foley catheter changed by urology one day prior to this admission but has not had urine output.  In ED, Foley catheter was changed and 800 cc clear urine drained. Patient was found to be hypoxic in ED with generalized swelling. He was further admitted for evaluation of hypoxia. VQ scan shows low probability for pulmonary embolism. Doppler study negative for DVT. He was started on Lasix in ED for mild interstitial edema based on chest x-ray. In addition, creatinine was 1.65 on this admission. Lasix was subsequently discontinued due to development of hypotension and worsening kidney function.  Patient continues to experience significant abdominal pain. Abdominal x-ray done 10/10/2012 shows possible constipation. We have increased bowel regimen to include Colace, MiraLAX and bisacodyl. Patient was switched from dilaudid PCA to morphine PCA and is on an extensive pain med regimen due to severe abdominal pain. Repeated abdominal x ray today shows as expected colonic ileus. We will discontinue PCA morphine today and transition to IV morphine. GI consulted for input on management.  Assessment/Plan:   Principal Problem:  *Abdominal distention, pain  Followup abdominal ultrasound  Based on abdominal x-ray today - colonic ileus. Cintinue Colace, MiraLAX and bisacodyl  Appreciate GI consult - recommended enema and CT abdomen without contrast D/C morphine PCA and transition to IV morphine; decrease oxycodone to 20 mg Q 12 hours and use tramadol for pain additional relief  Active Problems:    Acute respiratory failure with hypoxia  Likely secondary to COPD exacerbation versus mild interstitial edema  No further respiratory distress Continue home spiriva  VQ scan Shows low probability for PE; or extremity Dopplers negative for DVT  Fracture of tibia, distal, right, closed  Stable  Bilateral knee pain  Followup x-ray of both knees - negative for effusions or disslocations  Diabetes mellitus type 2 with complications  A1c 7.3 slightly above goal  Continue sliding scale insulin  Appreciate diabetic coordinator recommendations Anemia of chronic disease  Perhaps secondary to CHF  No signs of active bleed  Hemoglobin stable Acute kidney injury  Secondary to UTI, urinary retention and Lasix  Resolved Obesity (BMI 30-39.9)  Nutrition consulted Hypertensive heart disease  Added Cardizem 60 mg Q6  hours to help with HR  Continue Imdur 60 mg daily  Hydralazine 10 mg IV PRN Q 6 hours COPD (chronic obstructive pulmonary disease)  Stable  Continue Spiriva Diastolic CHF, chronic  BNP on admission 5080  Continue intake and output measurement  We will restart lasix as kidney function normalized, 20 mg daily IV  Urinary retention  Secondary to chronic urethral stricture  Foley in place, good urine output UTI (lower urinary tract infection)  Continue Rocephin  Urine cultures were not obtained at the time of admission and since patient is already on Rocephin urine culture would most likely be negative   Code Status: Full code  Family Communication: Wife at bedside  Disposition Plan: needs PT eval prior to discharge   Manson Passey, MD  [facility]  Pager [phone]   Consultants:  Urology (Dr. Berneice Heinrich) - phone call only- recommended keeping foley and follow up outpatient  GI (Dr. Bosie Clos) Procedures:  None Antibiotics:  Rocephin 10/09/2012 -->  If 7PM-7AM, please contact night-coverage [email or web address] Password [facility] 10/12/2012, 12:24 PM   LOS: 5 days    HPI/Subjective: Reports having almost 2 BM today but still with abdominal pain.  Objective: Filed Vitals:   10/12/12 0534 10/12/12 0800 10/12/12 0807 10/12/12 0837  BP: 149/76     Pulse: 110     Temp: 98.4 F (36.9 C)     TempSrc: Oral     Resp: 20   18  Height:      Weight:      SpO2: 94% 85% 92% 96%    Intake/Output Summary (Last 24 hours) at 10/12/12 1224 Last data filed at 10/11/12 2351  Gross per 24 hour  Intake   1862 ml  Output   1000 ml  Net    862 ml    Exam:   General:  Pt is alert, follows commands appropriately, not in acute distress  Cardiovascular: Regular rate and rhythm, S1/S2 appreciated  Respiratory: Clear to auscultation bilaterally, no wheezing, no crackles, no rhonchi  Abdomen: distended, bowel sounds sluggish, no guarding  Extremities: No edema, pulses DP and PT palpable bilaterally; blisters on left lower extremity  Neuro: Grossly nonfocal  Data Reviewed: Basic Metabolic Panel:  Lab 10/12/12 1610 10/11/12 0425 10/10/12 0645 10/09/12 2240 10/09/12 0335  NA 139 136 138 133* 135  K 4.0 4.2 4.5 4.4 4.9  CL 102 99 100 96 98  CO2 27 28 27 28 25   GLUCOSE 106* 121* 134* 113* 164*  BUN 30* 37* 47* 48* 45*  CREATININE 1.26 1.64* 2.15* 2.31* 2.15*  CALCIUM 8.4 8.3* 8.2* 8.0* 8.0*  MG -- -- -- -- --  PHOS -- -- -- -- --   Liver Function Tests:  Lab 10/09/12 2240 Nov 02, 2012 2125  AST 44* 101*  ALT 35 47  ALKPHOS 109 124*  BILITOT 0.6 1.7*  PROT 7.2 8.3  ALBUMIN 2.6* 3.2*   No results found for this basename: LIPASE:5,AMYLASE:5 in the last 168 hours No results found for this basename: AMMONIA:5 in the last 168 hours CBC:  Lab 10/12/12 0430 10/11/12 0425 10/10/12 0645 10/09/12 2240 10/09/12 0335 November 02, 2012 2125  WBC 7.6 7.7 7.2 8.0 7.8 --  NEUTROABS -- 5.7 -- -- -- 6.0  HGB 8.9* 9.0* 9.6* 9.5* 9.8* --  HCT 30.0* 30.8* 32.1* 32.4* 34.0* --  MCV 81.7 82.4 82.7 82.9 83.1 --  PLT 177 175 165 170 166 --   Cardiac Enzymes:  Lab 10/08/12  1334 10/08/12 0830 10/08/12 0241  CKTOTAL -- -- --  CKMB -- -- --  CKMBINDEX -- -- --  TROPONINI <0.30 <0.30 <0.30   CBG:  Lab 10/12/12 0739 10/11/12 1709 10/11/12 1123 10/11/12 0759 10/10/12 1716  GLUCAP 99 113* 108* 104* 153*    Recent Results (from the past 240 hour(s))  MRSA PCR SCREENING     Status: Normal   Collection Time   10/08/12  4:21 AM      Component Value Range Status Comment   MRSA by PCR NEGATIVE  NEGATIVE Final      Studies: Dg Knee 1-2 Views Left 10/10/2012  *  IMPRESSION:  Negative   Original Report Authenticated By: D. Andria Rhein, MD    Dg Knee 1-2 Views Right 10/10/2012  * IMPRESSION:  1.  No acute bony abnormality   Original Report Authenticated By: D. Andria Rhein, MD    Dg Abd 1 View 10/12/2012   IMPRESSION: Potentially less stool in the visualized colon.  There remains pattern consistent with colonic  ileus.     US Abdomen Complete 10/11/2012    IMPRESSION: Technically suboptimal exam.  Nondistended gallbladder, but no gallstones identified.  Small right pleural effusion noted.   Original Report Authenticated By: Myles Rosenthal, M.D.     Scheduled Meds:   . antiseptic oral rinse  15 mL Mouth Rinse BID  . bisacodyl  10 mg Rectal Daily  . cefTRIAXone (ROCEPHIN)  IV  1 g Intravenous Q24H  . diltiazem  60 mg Oral Q6H  . docusate sodium  100 mg Oral BID  . enoxaparin (LOVENOX) injection  40 mg Subcutaneous Q24H  . insulin aspart  0-15 Units Subcutaneous TID WC  . isosorbide mononitrate  60 mg Oral Daily  . methocarbamol (ROBAXIN) IV  500 mg Intravenous Q8H  . OxyCODONE  40 mg Oral Q12H  . polyethylene glycol  17 g Oral QID  . sodium chloride  10-40 mL Intracatheter Q12H  . Tamsulosin HCl  0.4 mg Oral Daily  . tiotropium  18 mcg Inhalation Daily

## 2012-10-12 NOTE — Progress Notes (Addendum)
Pt. Given tap water enema as ordered, sitting on bedpan waiting for results. PCA dc'd. Pt given tramadol for pain. CT scan done.

## 2012-10-12 NOTE — Consult Note (Addendum)
Referring Provider: Dr. Elisabeth Pigeon Primary Care Physician:  No primary provider on file. Primary Gastroenterologist:  Gentry Fitz  Reason for Consultation:  Constipation; Abdominal pain  HPI: Terry [name] is a 67 y.o. male with obstipation and constipation in the setting of chronic narcotic pain meds. Thinks his last formed stool was over a week ago. Chronically has one BM per week and denies chronic laxatives. Has been on Miralax and stool softeners without benefit. Refusing suppositories. Has drunk 1/2 gallon of GoLytely without benefit. Abdominal pain diffuse and feels full and tight across his abdomen. Denies N/V. On Morphine PCA. Limited Xray today consistent with ileus. Patient's sats drop into low 80's while talking without nonrebreather mask on and go back up to high 90's on the mask.  Past Medical History  Diagnosis Date  . Diabetes mellitus without complication   . Peripheral vascular disease   . Hypertensive heart disease   . Obesity (BMI 30-39.9)   . Lumbar disc disease   . Anginal pain   . Hypertension     Past Surgical History  Procedure Date  . Back surgery     numerous spine surgeries, 1966-2011  . Hip replacement     bilateral, each replaced twice  . Tonsillectomy   . Tibia im nail insertion 08/17/2012    Procedure: INTRAMEDULLARY (IM) NAIL TIBIAL;  Surgeon: Kerrin Champagne, MD;  Location: [facility];  Service: Orthopedics;  Laterality: Right;  . Joint replacement Bilateral hip    Prior to Admission medications   Medication Sig Start Date End Date Taking? Authorizing Provider  insulin aspart (NOVOLOG) 100 UNIT/ML injection Inject 0-15 Units into the skin 3 (three) times daily with meals. CBG < 70: Drink juice; CBG 70 - 120: 0 units: CBG 121 - 150: 2 units; CBG 151 - 200: 3 units; CBG 201 - 250: 5 units; CBG 251 - 300: 8 units;CBG 301 - 350: 11 units; CBG 351 - 400: 15 units; CBG > 400 : 15 units and notify MD 08/22/12  Yes Ripudeep Jenna Luo, MD  isosorbide mononitrate (IMDUR) 60  MG 24 hr tablet Take 60 mg by mouth daily.   Yes Historical Provider, MD  losartan-hydrochlorothiazide (HYZAAR) 100-25 MG per tablet Take 1 tablet by mouth daily.   Yes Historical Provider, MD  metformin (FORTAMET) 500 MG (OSM) 24 hr tablet Take 500 mg by mouth 3 (three) times daily.   Yes Historical Provider, MD  metoprolol (LOPRESSOR) 50 MG tablet Take 50 mg by mouth 2 (two) times daily.   Yes Historical Provider, MD  OxyCODONE (OXYCONTIN) 40 mg T12A Take 1 tablet (40 mg total) by mouth every 12 (twelve) hours. 08/22/12  Yes Kerrin Champagne, MD  polyethylene glycol [facility] / GLYCOLAX) packet Take 17 g by mouth daily as needed. For constipation   Yes Historical Provider, MD  silodosin (RAPAFLO) 8 MG CAPS capsule Take 8 mg by mouth daily.   Yes Historical Provider, MD  tiotropium (SPIRIVA) 18 MCG inhalation capsule Place 1 capsule (18 mcg total) into inhaler and inhale daily. 08/22/12  Yes Ripudeep Jenna Luo, MD  albuterol (PROVENTIL) (5 MG/ML) 0.5% nebulizer solution Take 0.5 mLs (2.5 mg total) by nebulization 2 (two) times daily. And q4hours PRN for wheezing and SOB 08/22/12   Ripudeep Jenna Luo, MD    Scheduled Meds:   . antiseptic oral rinse  15 mL Mouth Rinse BID  . bisacodyl  10 mg Rectal Daily  . cefTRIAXone (ROCEPHIN)  IV  1 g Intravenous Q24H  .  diltiazem  60 mg Oral Q8H  . docusate sodium  100 mg Oral BID  . enoxaparin (LOVENOX) injection  40 mg Subcutaneous Q24H  . insulin aspart  0-15 Units Subcutaneous TID WC  . isosorbide mononitrate  60 mg Oral Daily  . methocarbamol (ROBAXIN) IV  500 mg Intravenous Q8H  . morphine   Intravenous Q4H  . OxyCODONE  40 mg Oral Q12H  . polyethylene glycol  17 g Oral QID  . sodium chloride  10-40 mL Intracatheter Q12H  . sodium chloride  3 mL Intravenous Q12H  . Tamsulosin HCl  0.4 mg Oral Daily  . tiotropium  18 mcg Inhalation Daily   Continuous Infusions:  PRN Meds:.sodium chloride, albuterol, diphenhydrAMINE, diphenhydrAMINE, hydrALAZINE, morphine  injection, naloxone, ondansetron (ZOFRAN) IV, sodium chloride, sodium chloride, sodium chloride  Allergies as of 09/24/2012  . (No Known Allergies)    History reviewed. No pertinent family history.  History   Social History  . Marital Status: Married    Spouse Name: N/A    Number of Children: N/A  . Years of Education: N/A   Occupational History  . Not on file.   Social History Main Topics  . Smoking status: Former Smoker -- 1.0 packs/day for 50 years    Types: Cigarettes    Quit date: 08/16/2012  . Smokeless tobacco: Never Used  . Alcohol Use: No     Comment: rarely  . Drug Use: No  . Sexually Active: Yes   Other Topics Concern  . Not on file   Social History Narrative   Divorced.   2 children. Remarried.  He is on disability and retired from [employer].    Review of Systems: See HPI  Physical Exam: Vital signs: Filed Vitals:   10/12/12 0837  BP: 149/76  Pulse: 110  Temp: 98.4  Resp: 18   Last BM Date: 10/11/12 (scant amt) General:   Alert,  Well-developed, well-nourished, no acute distress Lungs:  Clear throughout to auscultation.   No wheezes, crackles, or rhonchi. No acute distress. Heart:  Regular rate and rhythm; no murmurs, clicks, rubs,  or gallops. Abdomen: diffusely tender with guarding, distended, high-pitched bowel sounds Rectal:  Deferred Ext: LE edema, tender bilaterally  GI:  Lab Results:  Basename 10/12/12 0430 10/11/12 0425 10/10/12 0645  WBC 7.6 7.7 7.2  HGB 8.9* 9.0* 9.6*  HCT 30.0* 30.8* 32.1*  PLT 177 175 165   BMET  Basename 10/12/12 0430 10/11/12 0425 10/10/12 0645  NA 139 136 138  K 4.0 4.2 4.5  CL 102 99 100  CO2 27 28 27   GLUCOSE 106* 121* 134*  BUN 30* 37* 47*  CREATININE 1.26 1.64* 2.15*  CALCIUM 8.4 8.3* 8.2*   LFT  Basename 10/09/12 2240  PROT 7.2  ALBUMIN 2.6*  AST 44*  ALT 35  ALKPHOS 109  BILITOT 0.6  BILIDIR --  IBILI --   PT/INR No results found for this basename: LABPROT:2,INR:2 in  the last 72 hours   Studies/Results: Dg Knee 1-2 Views Left  10/10/2012  *RADIOLOGY REPORT*  Clinical Data: Pain without trauma  LEFT KNEE - 1-2 VIEW  Comparison: None.  Findings: Extensive femoropopliteal arterial calcifications.  No definite effusion. Negative for fracture, dislocation, or other acute abnormality.  Normal alignment and mineralization. No significant degenerative change.  Regional soft tissues unremarkable.  IMPRESSION:  Negative   Original Report Authenticated By: D. Andria Rhein, MD    Dg Knee 1-2 Views Right  10/10/2012  *RADIOLOGY REPORT*  Clinical Data: Pain without trauma  RIGHT KNEE - 1-2 VIEW  Comparison: 08/17/2012 and earlier studies  Findings: IM rod with two proximal interlocking screws in the tibial shaft is noted, distal aspect not visualized. Moderate femoropopliteal arterial calcifications.  No effusion.  Negative for fracture, dislocation, or other acute bony abnormality.  Normal mineralization and alignment.  IMPRESSION:  1.  No acute bony abnormality   Original Report Authenticated By: D. Andria Rhein, MD    Dg Abd 1 View  10/12/2012  *RADIOLOGY REPORT*  Clinical Data: Abdominal distention.  ABDOMEN - 1 VIEW  Comparison: 10/10/2012  Findings: A semierect film was obtained of the lower chest and upper to mid abdomen.  The lower abdomen and pelvis was cut off on the portable film.  The patient refused further imaging.  The obtained film shows potentially less stool in the visualized colon.  There remains gaseous dilatation of the visualized colon consistent with ileus.  No gross free air is identified.  IMPRESSION: Potentially less stool in the visualized colon.  There remains pattern consistent with colonic ileus.   Original Report Authenticated By: Irish Lack, M.D.    US Abdomen Complete  10/11/2012  *RADIOLOGY REPORT*  Clinical Data:  Abdominal discomfort and distention.  Diabetes and hypertension.  ABDOMINAL ULTRASOUND COMPLETE  Comparison:  None.  Findings:  Technically difficult exam due to patient immobility, body wall edema, and overlying bowel gas.  Gallbladder:  Gallbladder is incompletely distended.  No gallstones are identified.  No evidence of gallbladder wall thickening.  Common Bile Duct:  Within normal limits in caliber. Common bile duct measures 5 mm in diameter.  Liver: No focal mass lesion identified.  Within normal limits in parenchymal echogenicity.  Small right pleural effusion incidentally noted.  IVC:  Appears normal.  Pancreas:  Not well visualized due to overlying bowel gas.  The  Spleen:  Within normal limits in size and echotexture.  Right kidney:  Normal in size and parenchymal echogenicity.  No evidence of mass or hydronephrosis.  Left kidney:  Not well visualized due to bowel gas and patient habitus.  Abdominal Aorta:  No aneurysm identified.  IMPRESSION: Technically suboptimal exam.  Nondistended gallbladder, but no gallstones identified.  Small right pleural effusion noted.   Original Report Authenticated By: Myles Rosenthal, M.D.     Impression/Plan: 66yo with obstipation in the setting of chronic pain meds. Suspect bowel obstruction from the narcotic pain meds and needs better imaging of abdomen with CT to look for obstruction. Pain meds need to be weaned off. Doubt that additional GoLytely right now will be helpful and would recommend holding off on more until CT rules out obstruction. Will need enemas today to see if that will help get his bowels moving. Spouse asked about sedation for the CT because patient cannot lie flat without severe back pain. Will be difficult to sedate him safely due to his chronic pain med use and tenuous respiratory status. D/W Dr. Elisabeth Pigeon and will defer to her. Bowel rest (NPO except meds), tap water enemas, Wean pain meds, Abd CT and if no obstruction on CT ok to resume GoLytely.    LOS: 5 days   Kathy Wares C.  10/12/2012, 11:03 AM

## 2012-10-13 ENCOUNTER — Inpatient Hospital Stay (HOSPITAL_COMMUNITY): Payer: 59

## 2012-10-13 DIAGNOSIS — R143 Flatulence: Secondary | ICD-10-CM

## 2012-10-13 LAB — BASIC METABOLIC PANEL
BUN: 28 mg/dL — ABNORMAL HIGH (ref 6–23)
CO2: 28 mEq/L (ref 19–32)
Calcium: 8.7 mg/dL (ref 8.4–10.5)
Creatinine, Ser: 1.26 mg/dL (ref 0.50–1.35)
Glucose, Bld: 108 mg/dL — ABNORMAL HIGH (ref 70–99)

## 2012-10-13 LAB — CBC
MCH: 24.4 pg — ABNORMAL LOW (ref 26.0–34.0)
MCV: 82.7 fL (ref 78.0–100.0)
Platelets: 205 10*3/uL (ref 150–400)
RDW: 24.2 % — ABNORMAL HIGH (ref 11.5–15.5)

## 2012-10-13 MED ORDER — FUROSEMIDE 10 MG/ML IJ SOLN
40.0000 mg | Freq: Once | INTRAMUSCULAR | Status: AC
  Administered 2012-10-13: 40 mg via INTRAVENOUS
  Filled 2012-10-13: qty 4

## 2012-10-13 MED ORDER — OXYCODONE HCL ER 20 MG PO T12A
20.0000 mg | EXTENDED_RELEASE_TABLET | Freq: Two times a day (BID) | ORAL | Status: DC
  Administered 2012-10-13 – 2012-10-22 (×20): 20 mg via ORAL
  Filled 2012-10-13 (×21): qty 1

## 2012-10-13 MED ORDER — ENOXAPARIN SODIUM 60 MG/0.6ML ~~LOC~~ SOLN
50.0000 mg | SUBCUTANEOUS | Status: DC
  Filled 2012-10-13: qty 0.6

## 2012-10-13 MED ORDER — POLYETHYLENE GLYCOL 3350 17 GM/SCOOP PO POWD
1.0000 | Freq: Once | ORAL | Status: AC
  Administered 2012-10-13: 1 via ORAL
  Filled 2012-10-13: qty 255

## 2012-10-13 MED ORDER — HYDRALAZINE HCL 20 MG/ML IJ SOLN
10.0000 mg | Freq: Four times a day (QID) | INTRAMUSCULAR | Status: DC
  Administered 2012-10-13 – 2012-10-20 (×30): 10 mg via INTRAVENOUS
  Filled 2012-10-13 (×3): qty 0.5
  Filled 2012-10-13: qty 1
  Filled 2012-10-13 (×2): qty 0.5
  Filled 2012-10-13: qty 1
  Filled 2012-10-13 (×2): qty 0.5
  Filled 2012-10-13: qty 1
  Filled 2012-10-13 (×4): qty 0.5
  Filled 2012-10-13: qty 1
  Filled 2012-10-13 (×5): qty 0.5
  Filled 2012-10-13: qty 1
  Filled 2012-10-13: qty 0.5
  Filled 2012-10-13: qty 1
  Filled 2012-10-13: qty 0.5
  Filled 2012-10-13: qty 1
  Filled 2012-10-13: qty 0.5
  Filled 2012-10-13: qty 1
  Filled 2012-10-13 (×9): qty 0.5

## 2012-10-13 NOTE — Plan of Care (Signed)
Problem: Phase I Progression Outcomes Goal: EF % per last Echo/documented,Core Reminder form on chart Outcome: Completed/Met Date Met:  10/13/12 EF=65-75% from 2D Echo 08/17/2012

## 2012-10-13 NOTE — Care Management Note (Unsigned)
    Page 1 of 2   10/20/2012     2:55:59 PM   CARE MANAGEMENT NOTE 10/20/2012  Patient:  Terry [name], Terry [name]   Account Number:  [id number]  Date Initiated:  10/13/2012  Documentation initiated by:  [facility]  Subjective/Objective Assessment:   ADMITTED W/SOB.COPD.COLONIC ILEUS,CHF.     Action/Plan:   FROM HOME.HAS PCP,PHARMACY.HAS CANE,RW,W/C.   Anticipated DC Date:  10/24/2012   Anticipated DC Plan:  HOME W HOME HEALTH SERVICES      DC Planning Services  CM consult      Choice offered to / List presented to:  C-1 Patient           Status of service:  In process, will continue to follow Medicare Important Message given?   (If response is "NO", the following Medicare IM given date fields will be blank) Date Medicare IM given:   Date Additional Medicare IM given:    Discharge Disposition:    Per UR Regulation:  Reviewed for med. necessity/level of care/duration of stay  If discussed at Long Length of Stay Meetings, dates discussed:   10/14/2012  10/16/2012    Comments:  10/20/12 Vyron Fronczak RN,BSN NCM [phone] IMPROVING,DIURESING,02 5LNC.D/C PLAN HOME W/HH-[facility] FOLLOWING FOR HH ORDERS.RECOMMEND HHRN/PT/OT.  10/16/12 Kimila Papaleo RN,BSN NCM [phone] CONTINUE TO MONITOR PROGRESS FOR D/C PLANS.[facility] FOLLOWING.  10/15/12 Zayley Arras RN,BSN NCM [phone] STILL ON VENTI MASK.PT-HH.PATIENT DECLINE HOSPITAL BED @ THIS TIME.[facility] ALREADY FOLLOWING.  10/14/12 Areej Tayler RN,BSN NCM [phone] [facility] FOLLOWING HHPT/RN/OT/NURSE'S AIDE.IF HOME 02 NEEDED WILL NEED 02 SATS DOCUMENTED IN PROGRESS NOTES.HOME 02 W/LITER FLOW/FREQ/DURATION.  10/13/12 Earnestene Angello RN,BSN NCM [phone] ABD DISTENDED,NPO,GI FOLLOWING.PATIENT DECLINED REHAB IN ANY SETTING,WANT HOME [facility].PROVIDED SPOUSE [facility] AGENCY LIST.IF HOME 02 NEEDED CAN ARRANGE IF 02 SATS QUALIFY.

## 2012-10-13 NOTE — Progress Notes (Addendum)
[facility] PROGRESS NOTE  Terry [name] GNF:[id number] DOB: 11-Nov-1945 DOA: 09/28/2012 PCP: No primary provider on file.  Brief narrative: 67 -year-old male with multiple medical comorbidities including but not limited to COPD, diastolic CHF, chronic urethral stricture and urinary retention, recent hospitalization for right tibia, fibula fracture, obesity who presented 09/26/2012 with urinary retention. Patient had a Foley catheter changed by urology one day prior to this admission but has not had urine output.  In ED, Foley catheter was changed and 800 cc clear urine drained. Patient was found to be hypoxic in ED with generalized swelling. He was further admitted for evaluation of hypoxia. VQ scan shows low probability for pulmonary embolism. Doppler study negative for DVT. He was started on Lasix in ED for mild interstitial edema based on chest x-ray. In addition, creatinine was 1.65 on this admission. Lasix was subsequently discontinued due to development of hypotension and worsening kidney function.  Patient continued to experience abdominal pain and distention. Abdominal x-ray done 10/10/2012 shows possible constipation. Repeat abdominal x-ray showed possible colonic ileus and CT abdomen/pelvis done 10/12/2012 shows concern for the same, colonic ileus. We have increased bowel regimen to include Colace, MiraLAX and bisacodyl. Patient was on quite an extensive pain medication regimen which has been cut down to include morphine 1-2 mg every 6 hours as needed IV for pain, oxycodone and 20 mg every 12 hours scheduled by mouth and tramadol 50 mg every 6 hours as needed. GI assisting care.  Assessment/Plan:   Principal Problem:  *Abdominal distention, pain  Secondary to colonic ileus Continue current regimen with Colace, MiraLAX and bisacodyl. Had a GoLYTELY with some bowel movement. Continue enema per GI recommendations and may need GoLYTELY Pain medications as above: Morphine 1-2 mg every 6 hours  as needed IV for pain, OxyContin 20 mg every 12 hours scheduled and tramadol 50 mg every 6 hours as needed by mouth Appreciate GI following Active Problems:  Acute respiratory failure with hypoxia  Likely secondary to COPD exacerbation versus mild interstitial edema  No further respiratory distress  Continue home spiriva  VQ scan Shows low probability for PE; or extremity Dopplers negative for DVT  Fracture of tibia, distal, right, closed  Stable  Bilateral knee pain  Followup x-ray of both knees - negative for effusions or disslocations  Diabetes mellitus type 2 with complications  A1c 7.3 slightly above goal  Continue sliding scale insulin  Appreciate diabetic coordinator recommendations Anemia of chronic disease  Perhaps secondary to CHF  No signs of active bleed  Hemoglobin stable Acute kidney injury  Secondary to UTI, urinary retention and Lasix  Resolved Obesity (BMI 30-39.9)  Nutrition consulted Hypertensive heart disease  Added Cardizem 60 mg Q6 hours to help with HR  Continue Imdur 60 mg daily and add hydralazine 10 mg every 6 hours IV COPD (chronic obstructive pulmonary disease)  Stable  Continue Spiriva Diastolic CHF, chronic  BNP on admission 5080  Repeat BNP today. 2-D echo was done in December 2013 with normal ejection fraction Continue intake and output measurement  Lasix 40 mg IV today Urinary retention  Secondary to chronic urethral stricture  Foley in place, good urine output UTI (lower urinary tract infection)  Continue Rocephin  Urine cultures were not obtained at the time of admission and since patient is already on Rocephin urine culture would most likely be negative   Code Status: Full code  Family Communication: Wife at bedside  Disposition Plan: needs PT eval prior to discharge   DEVINE,  Aniceto Boss, MD  [facility]  Pager [phone]   Consultants:  Urology (Dr. Berneice Heinrich) - phone call only- recommended keeping foley and follow up outpatient  GI (Dr.  Bosie Clos) Procedures:  None Antibiotics:  Rocephin 10/04/2012 -->  If 7PM-7AM, please contact night-coverage [email or web address] Password [email or web address] 10/13/2012, 1:13 PM   LOS: 6 days   HPI/Subjective: No acute overnight events.  Objective: Filed Vitals:   10/12/12 2201 10/13/12 0500 10/13/12 0934 10/13/12 1051  BP: 194/91 181/78  154/89  Pulse: 104 94    Temp: 97.6 F (36.4 C) 98 F (36.7 C)    TempSrc: Oral Oral    Resp: 20 20    Height:      Weight:  118 kg (260 lb 2.3 oz)    SpO2: 100% 100% 96%     Intake/Output Summary (Last 24 hours) at 10/13/12 1313 Last data filed at 10/13/12 0841  Gross per 24 hour  Intake 3194.17 ml  Output   1152 ml  Net 2042.17 ml    Exam:   General:  Pt is alert, follows commands appropriately, not in acute distress  Cardiovascular: Regular rate and rhythm, S1/S2, no murmurs, no rubs, no gallops  Respiratory: Clear to auscultation bilaterally, no wheezing, no crackles, no rhonchi  Abdomen: Still distended, somewhat tender, sluggish bowel sounds  Extremities: +1 lower extremity pitting edema, pulses DP and PT palpable bilaterally  Neuro: Grossly nonfocal  Data Reviewed: Basic Metabolic Panel:  Lab 10/13/12 1478 10/12/12 0430 10/11/12 0425 10/10/12 0645 10/09/12 2240  NA 137 139 136 138 133*  K 4.1 4.0 4.2 4.5 4.4  CL 100 102 99 100 96  CO2 28 27 28 27 28   GLUCOSE 108* 106* 121* 134* 113*  BUN 28* 30* 37* 47* 48*  CREATININE 1.26 1.26 1.64* 2.15* 2.31*  CALCIUM 8.7 8.4 8.3* 8.2* 8.0*  MG -- -- -- -- --  PHOS -- -- -- -- --   Liver Function Tests:  Lab 10/09/12 2240 10/05/2012 2125  AST 44* 101*  ALT 35 47  ALKPHOS 109 124*  BILITOT 0.6 1.7*  PROT 7.2 8.3  ALBUMIN 2.6* 3.2*   CBC:  Lab 10/13/12 0421 10/12/12 0430 10/11/12 0425 10/10/12 0645 10/09/12 2240 10/05/2012 2125  WBC 6.6 7.6 7.7 7.2 8.0 --  NEUTROABS -- -- 5.7 -- -- 6.0  HGB 9.0* 8.9* 9.0* 9.6* 9.5* --  HCT 30.5* 30.0* 30.8* 32.1* 32.4* --  MCV 82.7 81.7 82.4 82.7  82.9 --  PLT 205 177 175 165 170 --   Cardiac Enzymes:  Lab 10/08/12 1334 10/08/12 0830 10/08/12 0241  CKTOTAL -- -- --  CKMB -- -- --  CKMBINDEX -- -- --  TROPONINI <0.30 <0.30 <0.30   CBG:  Lab 10/13/12 1141 10/13/12 0744 10/12/12 1654 10/12/12 1224 10/12/12 0739  GLUCAP 134* 130* 110* 126* 99    Recent Results (from the past 240 hour(s))  MRSA PCR SCREENING     Status: Normal   Collection Time   10/08/12  4:21 AM      Component Value Range Status Comment   MRSA by PCR NEGATIVE  NEGATIVE Final      Studies: Ct Abdomen Pelvis Wo Contrast 10/12/2012  *  IMPRESSION:  1.  Bilateral small pleural effusion with bilateral basilar posterior atelectasis. 2.  There is a significant distention of the right colon and hepatic flexure of the colon with stools suspicious for fecal impaction.  Colonic diameter measures at least 9 cm. 3.  There is distention of the transverse colon  with gas measures 8.5 cm in diameter suspicious for colonic ileus.  The left colon and sigmoid colon are collapsed.  No definite obstructing colonic mass is identified.  Correlation with colonoscopy is recommended as clinically warranted. 4.  No small bowel obstruction. 5.  There is anasarca infiltration of the subcutaneous fat abdominal wall pelvic wall and bilateral flank wall. 6.  There is a hematoma in the lower aspect of the left rectus muscle.  Measures 7.2 x 7.8 cm extending about 10 cm cranial caudally.     Dg Abd 1 View 10/12/2012  *  IMPRESSION: Potentially less stool in the visualized colon.  There remains pattern consistent with colonic ileus.   Original Report Authenticated By: Irish Lack, M.D.     Scheduled Meds:   . antiseptic oral rinse  15 mL Mouth Rinse BID  . bisacodyl  10 mg Rectal Daily  . cefTRIAXone (ROCEPHIN)  IV  1 g Intravenous Q24H  . diltiazem  60 mg Oral Q6H  . docusate sodium  100 mg Oral BID  . enoxaparin (LOVENOX) injection  40 mg Subcutaneous Q24H  . furosemide  40 mg Intravenous  Once  . hydrALAZINE  10 mg Intravenous Q6H  . insulin aspart  0-15 Units Subcutaneous TID WC  . isosorbide mononitrate  60 mg Oral Daily  . methocarbamol (ROBAXIN) IV  500 mg Intravenous Q8H  . OxyCODONE  20 mg Oral Q12H  . polyethylene glycol powder  1 Container Oral Once  . Tamsulosin HCl  0.4 mg Oral Daily  . tiotropium  18 mcg Inhalation Daily

## 2012-10-13 NOTE — Progress Notes (Signed)
10/13/12-07:15--NT reported that pt had fairly good results -large amt of liq returned & one index finger length very hard & large in circumference stool from the tap H20 enema given this am.

## 2012-10-13 NOTE — Progress Notes (Signed)
10/13/12 1400  PT Visit Information  Last PT Received On 10/13/12  Reason Eval/Treat Not Completed Pain limiting ability to participate

## 2012-10-13 NOTE — Progress Notes (Signed)
MD called and stated pt was still full of stool and needed next enema. Will administer and continue to monitor. - Christell Faith, RN

## 2012-10-13 NOTE — Progress Notes (Signed)
Attempted to give another tap water enema that was ordered by MD to be given four hours after the first enema, however, pt requested that the enema be delayed a little while longer. It was explained to pt that MD wanted the enemas to be given at the time increments, but pt still requested to wait. Will ask night RN to give enema at about 2030 as this was discussed and agreed upon with pt. Pt encouraged to continue to drink Miralax at bedside. Will continue to encourage and help pt to finish the Miralax and to receive the enemas ordered by MD.  Arta Bruce [facility] 10/13/2012 6:47 PM

## 2012-10-13 NOTE — Progress Notes (Signed)
Pt was ordered 3 tap water enemas during day shift. Day shift RN gave one and stated pt refused and requested to wait when she went in to give the second. When I went in to ask pt if he was ready for the second enema, he stated he was having a BM in bed. This was his second BM today. Pt was cleaned when finished and BM charted. Paged on-call MD, Dr. Craige Cotta, to ask if pt still needed enema therapy. Pt stated he did not want the other two. MD ordered Abdominal DG view to see what stool was left to pass. DG done and awaiting results. Paged MD to call when she sees results and makes a decision concerning next two enemas. Pt resting comfortably in bed, still sipping on Miralax. No complaints or signs of distress at this time. Will continue to monitor. - Christell Faith, RN

## 2012-10-13 NOTE — Progress Notes (Signed)
Tap water enema given per MD order. Pt able to hold enema for about a minute at a time. Total of 1000cc given via enema. Pt had no results after the enema. Only water with brown specks returned. Pt tolerated enema fairly. Will give another enema in 4 hours per MD order and report to night RN to give one more on her shift per MD order (for a total of 3 today). Will continue to monitor pt.

## 2012-10-13 NOTE — Progress Notes (Signed)
RT checked with patient who has caregiver at bedside. They both state that patient is refusing CPAP at this time. Stated that he felt like he was suffocating. He did wear in ICU but did not wear on the floor. They also stated that someone had taken the CPAP from the room another day. RT encouraged them to call if they needed any further assistance or changed their mind.

## 2012-10-13 NOTE — Progress Notes (Signed)
2nd tap H20 enema given this am @ 5:45am, had small liq results off one last nite.P tis on BP at present, w/abd cramping

## 2012-10-13 NOTE — Progress Notes (Addendum)
Patient ID: Terry [name], male   DOB: 05-04-1946, 67 y.o.   MRN: [id number] [facility] Gastroenterology Progress Note  Terry [name] 67 y.o. 05-Jun-1946   Subjective: Lethargic in bed with oxygen mask. Had some BMs following enemas yesterday. CT reviewed showed fecal impaction without obstruction.  Objective: Vital signs: Filed Vitals:   10/13/12 1051  BP: 154/89  Pulse: 94  Temp: 98  Resp: 20    Physical Exam: Gen: alert, no acute distress  Abd: distended, diffusely tender  Lab Results:  Basename 10/13/12 0421 10/12/12 0430  NA 137 139  K 4.1 4.0  CL 100 102  CO2 28 27  GLUCOSE 108* 106*  BUN 28* 30*  CREATININE 1.26 1.26  CALCIUM 8.7 8.4  MG -- --  PHOS -- --   No results found for this basename: AST:2,ALT:2,ALKPHOS:2,BILITOT:2,PROT:2,ALBUMIN:2 in the last 72 hours  Basename 10/13/12 0421 10/12/12 0430 10/11/12 0425  WBC 6.6 7.6 --  NEUTROABS -- -- 5.7  HGB 9.0* 8.9* --  HCT 30.5* 30.0* --  MCV 82.7 81.7 --  PLT 205 177 --      Assessment/Plan: 67yo with constipation and CT showing large amount of stool in colon with right-sided fecal impaction without obstruction. Will continue tap water enemas (increase to 3 per day) but will increase volume of Miralax to a 1/2 gallon. If no significant benefit, then may need to repeat Golytely, which should work better now that narcotic pain meds have been weaned.   Terry [name] C. 10/13/2012, 12:43 PM

## 2012-10-14 DIAGNOSIS — M7989 Other specified soft tissue disorders: Secondary | ICD-10-CM

## 2012-10-14 LAB — CBC
HCT: 32.2 % — ABNORMAL LOW (ref 39.0–52.0)
Hemoglobin: 9.2 g/dL — ABNORMAL LOW (ref 13.0–17.0)
MCV: 83 fL (ref 78.0–100.0)
RDW: 24.5 % — ABNORMAL HIGH (ref 11.5–15.5)
WBC: 8.2 10*3/uL (ref 4.0–10.5)

## 2012-10-14 LAB — BASIC METABOLIC PANEL
BUN: 25 mg/dL — ABNORMAL HIGH (ref 6–23)
CO2: 27 mEq/L (ref 19–32)
Chloride: 100 mEq/L (ref 96–112)
Creatinine, Ser: 1.25 mg/dL (ref 0.50–1.35)
Potassium: 4.1 mEq/L (ref 3.5–5.1)

## 2012-10-14 LAB — GLUCOSE, CAPILLARY: Glucose-Capillary: 93 mg/dL (ref 70–99)

## 2012-10-14 MED ORDER — WARFARIN VIDEO
Freq: Once | Status: AC
  Administered 2012-10-14: 20:00:00

## 2012-10-14 MED ORDER — WARFARIN - PHARMACIST DOSING INPATIENT: Freq: Every day | Status: DC

## 2012-10-14 MED ORDER — COUMADIN BOOK
Freq: Once | Status: AC
  Administered 2012-10-14: 15:00:00
  Filled 2012-10-14: qty 1

## 2012-10-14 MED ORDER — FUROSEMIDE 10 MG/ML IJ SOLN
40.0000 mg | Freq: Once | INTRAMUSCULAR | Status: AC
  Administered 2012-10-14: 40 mg via INTRAVENOUS
  Filled 2012-10-14: qty 4

## 2012-10-14 MED ORDER — POLYETHYLENE GLYCOL 3350 17 GM/SCOOP PO POWD
1.0000 | Freq: Every day | ORAL | Status: AC
  Administered 2012-10-14 – 2012-10-15 (×2): 1 via ORAL
  Filled 2012-10-14: qty 255

## 2012-10-14 MED ORDER — WARFARIN SODIUM 7.5 MG PO TABS
7.5000 mg | ORAL_TABLET | Freq: Once | ORAL | Status: AC
  Administered 2012-10-14: 7.5 mg via ORAL
  Filled 2012-10-14: qty 1

## 2012-10-14 MED ORDER — ENOXAPARIN SODIUM 120 MG/0.8ML ~~LOC~~ SOLN
120.0000 mg | Freq: Two times a day (BID) | SUBCUTANEOUS | Status: DC
  Administered 2012-10-14 – 2012-10-15 (×3): 120 mg via SUBCUTANEOUS
  Filled 2012-10-14 (×4): qty 0.8

## 2012-10-14 NOTE — Progress Notes (Addendum)
ANTICOAGULATION CONSULT NOTE - Initial Consult  Pharmacy Consult for Lovenox Indication: new RUE DVT  No Known Allergies  Patient Measurements: Height: 6\' 1"  (185.4 cm) Weight: 265 lb 6.9 oz (120.4 kg) IBW/kg (Calculated) : 79.9   Vital Signs: BP: 145/80 mmHg (01/28 0540) Pulse Rate: 108  (01/28 0540)  Labs:  Basename 10/14/12 0522 10/13/12 0421 10/12/12 0430  HGB 9.2* 9.0* --  HCT 32.2* 30.5* 30.0*  PLT 217 205 177  APTT -- -- --  LABPROT -- -- --  INR -- -- --  HEPARINUNFRC -- -- --  CREATININE 1.25 1.26 1.26  CKTOTAL -- -- --  CKMB -- -- --  TROPONINI -- -- --    Estimated Creatinine Clearance: 77.9 ml/min (by C-G formula based on Cr of 1.25).   Medical History: Past Medical History  Diagnosis Date  . Diabetes mellitus without complication   . Peripheral vascular disease   . Hypertensive heart disease   . Obesity (BMI 30-39.9)   . Lumbar disc disease   . Anginal pain   . Hypertension     Assessment:  55 yom s/p tibia IM nail insertion 08/17/2012 presented 10/06/2012 with urinary retention, acute resp failure with hypoxia - Lovenox was started 1/21 for r/o VTE but discontinued on 1/23 given negative DVT on bilateral LE doppler and V/Q scan with low probability for PE.  Today 1/28, RUE venous duplex positive for DVT near PICC insertion site.  MD order to resume Lovenox.    Patient's wt has been increasing since admit, now up to 120 kg.  H/H low but stable.  No bleeding. GI on board for fecal impaction and constipation 2/2 chronic pain meds - no obstruction seen - GI signed off  Patient received Lovenox 40 mg sq on 1/27 @ 1051   Goal of Therapy:  Anti-Xa level 0.6-1.2 units/ml 4hrs after LMWH dose given Monitor platelets by anticoagulation protocol: Yes   Plan:   Lovenox 120 mg sq q12h  Monitor patient's weight  CBC q72 hours while on Lovenox (MD already ordered daily CBC)  F/u plan for long term anticoagulation  Geoffry Paradise, PharmD, BCPS Pager:  [phone] 11:32 AM Pharmacy #: [id number]   Addendum:    MD would like to add Coumadin to Lovenox therapy.  Coumadin score = 7.  No baseline PT/INR.  Plan:   STAT PT/INR.  Coumadin 7.5 mg po x 1 tonight.  Pharmacy will follow up with baseline INR - will adjust Coumadin dose if needed.  Geoffry Paradise, PharmD, BCPS Pager: [phone] 2:16 PM Pharmacy #: [phone]

## 2012-10-14 NOTE — Progress Notes (Signed)
VASCULAR LAB PRELIMINARY  PRELIMINARY  PRELIMINARY  PRELIMINARY  Right upper extremity venous duplex completed.    Preliminary report:  Positive for upper extremity DVT coursing along the PICC line from the insertion site into the proximal axillary vein. Thrombus is not fully occlusive at these sites. All other areas and veins are thrombus free.  Wyndham Santilli, RVS 10/14/2012, 10:38 AM

## 2012-10-14 NOTE — Progress Notes (Signed)
Pt given third tap water enema at 0400 per MD order to continue with enema treatment. Total of 1000cc given via enema. Pt tolerated enema well. Resulted in large amount of watery stool with approximately 1/3 of it formed. Will continue to monitor pt. - Christell Faith, RN

## 2012-10-14 NOTE — Progress Notes (Signed)
On 10/13/12 pt given first dose of bottle of Miralax at 1536. Pt drank on that bottle all night and still was not finished (about 1/4th or less left) when another bottle of Miralax was ordered for today. Today's bottle of Miralax started at 1247. Pt will drink about a half a cup at a time, but sleeps if not being kept aroused by staff. Pt not drinking Miralax on his own, staff encourages pt to drink and pt informed of plan of care. Will continue to monitor pt and encourage him to drink Miralax. Will have night RN follow up with amount of Miralax finished by pt.   Arta Bruce [facility]

## 2012-10-14 NOTE — Progress Notes (Addendum)
Patient ID: BRANDN MCGATH, male   DOB: 1946-02-08, 67 y.o.   MRN: [id number] [facility] Gastroenterology Progress Note  OLGA SEYLER 67 y.o. 19-Mar-1946   Subjective: Bowels moving with tap water enemas. Patient feels abdomen is softer and less tender. Resting on oxygen mask but easily arousable. Spouse at bedside.  Objective: Vital signs: Filed Vitals:   10/14/12 0540  BP: 145/80  Pulse: 108  Temp:   Resp:     Physical Exam: Gen: alert, no acute distress  Abd: distended, periumbilical tenderness with voluntary guarding  Lab Results:  Basename 10/14/12 0522 10/13/12 0421  NA 135 137  K 4.1 4.1  CL 100 100  CO2 27 28  GLUCOSE 113* 108*  BUN 25* 28*  CREATININE 1.25 1.26  CALCIUM 8.7 8.7  MG -- --  PHOS -- --   No results found for this basename: AST:2,ALT:2,ALKPHOS:2,BILITOT:2,PROT:2,ALBUMIN:2 in the last 72 hours  Basename 10/14/12 0522 10/13/12 0421  WBC 8.2 6.6  NEUTROABS -- --  HGB 9.2* 9.0*  HCT 32.2* 30.5*  MCV 83.0 82.7  PLT 217 205      Assessment/Plan: Fecal impaction and constipation due to chronic pain meds. Tap water enemas are working and would continue those TID for now. Will give repeat Miralax 1/2 gallon today and again tomorrow. May need to be placed on clears until large stool burden cleared but will keep on diet for now. Please call back if questions, otherwise will sign off with these final recs.   Brennen Gardiner C. 10/14/2012, 11:17 AM

## 2012-10-14 NOTE — Progress Notes (Signed)
[facility] PROGRESS NOTE  Terry [name] ZOX:[id number] DOB: 1946-03-17 DOA: 2012-11-02 PCP: No primary provider on file.  Brief narrative: 67 -year-old male with multiple medical comorbidities including but not limited to COPD, diastolic CHF, chronic urethral stricture and urinary retention, recent hospitalization for right tibia, fibula fracture, obesity who presented 11-02-12 with urinary retention. Patient had a Foley catheter changed by urology one day prior to this admission but has not had urine output.  In ED, Foley catheter was changed and 800 cc clear urine drained. Patient was found to be hypoxic in ED with generalized swelling. He was further admitted for evaluation of hypoxia. VQ scan showed low probability for pulmonary embolism. Lower extremity doppler study negative for DVT. He was started on Lasix in ED for mild interstitial edema based on chest x-ray. In addition, creatinine was 1.65 on this admission. Lasix was temporarily discontinued due to development of hypotension and worsening kidney function.  Patient continued to experience abdominal pain and distention. Abdominal x-ray done 10/10/2012 showed possible constipation. Repeat abdominal x-ray showed possible colonic ileus and CT abdomen/pelvis done 10/12/2012 shows concern for the same, colonic ileus. We have increased bowel regimen to include Colace, MiraLAX and bisacodyl. Patient was on quite an extensive pain medication regimen which has been cut down to include morphine 1-2 mg every 6 hours as needed IV for pain, oxycodone 20 mg every 12 hours scheduled by mouth and tramadol 50 mg every 6 hours as needed. GI assisted the care and at this time recommended enemas TID.   Assessment/Plan:   Principal Problem:  *Abdominal distention, pain  Secondary to colonic ileus  Continue current regimen with Colace, MiraLAX and bisacodyl. Had a GoLYTELY with some bowel movement. Continue enema TID per GI recommendations and may need to  be transitioned to clear liquid if abdominal pain does not improve but for now have decided to keep on regular diet Pain medications as above: Morphine 1-2 mg every 6 hours as needed IV for pain, OxyContin 20 mg every 12 hours scheduled and tramadol 50 mg every 6 hours as needed by mouth  GI signed off today 10/14/2012 Active Problems:  Right upper extremity DVT  In proximal axillary vein  Spoke with PCCM on call and recommendation was to take PICC line out and continue anticoagulation for next 3 months  Lovenox and coumadin per pahrmacy Acute respiratory failure with hypoxia  Likely secondary to COPD exacerbation versus mild interstitial edema  No further respiratory distress  Continue home spiriva  VQ scan showed low probability for PE; lower extremity doppler study negative for DVT  Fracture of tibia, distal, right, closed  Stable  Bilateral knee pain  Followup x-ray of both knees - negative for effusions or disslocations  Diabetes mellitus type 2 with complications  A1c 7.3 slightly above goal  Continue sliding scale insulin  Appreciate diabetic coordinator recommendations Anemia of chronic disease   No signs of active bleed  Hemoglobin stable, range 8.9 - 9.2 Acute kidney injury  Secondary to UTI, urinary retention and Lasix  Resolved Obesity (BMI 30-39.9)  Nutrition consulted Hypertensive heart disease  Added Cardizem 60 mg Q6 hours to help with HR (sinus tachycardia) Continue Imdur 60 mg daily and add hydralazine 10 mg every 6 hours IV COPD (chronic obstructive pulmonary disease)  Stable  Continue Spiriva Diastolic CHF, chronic  BNP on admission 5080 and BNP 10/13/2012 2380 2-D echo was done in December 2013 with normal ejection fraction  Continue intake and output measurement. Patient is in  only slight negative fluid balance, 165 cc. Will give another dose of Lasix 40 mg IV today and these can be transitioned to by mouth regimen. Kidney function remained  stable Urinary retention  Secondary to chronic urethral stricture  Foley in place, good urine output UTI (lower urinary tract infection)  Urine cultures were not obtained at the time of admission and since patient is already on Rocephin urine culture would most likely be negative We will discontinue Rocephin today after 7 days of treatment.   Code Status: Full code  Family Communication: Wife at bedside  Disposition Plan: needs PT eval prior to discharge. I have encouraged the patient to participate in physical therapy.   Manson Passey, MD  [facility]  Pager [phone]   Consultants:  Urology (Dr. Berneice Heinrich) - phone call only- recommended keeping foley and follow up outpatient  GI (Dr. Bosie Clos) PCCM phone call only for DVT management Procedures/Studies:  Upper extremity doppler, right - DVT in proximal axillary vein Antibiotics:  Rocephin 09/25/2012 --> 10/14/2012  If 7PM-7AM, please contact night-coverage [email or web address] Password [facility] 10/14/2012, 1:36 PM   LOS: 7 days   HPI/Subjective: Swollen today. Abdominal pain still persistent but getting somewhat better.  Objective: Filed Vitals:   10/13/12 2315 10/14/12 0405 10/14/12 0540 10/14/12 0800  BP: 169/82 150/84 145/80   Pulse: 110  108   Temp:      TempSrc:      Resp:      Height:      Weight:    120.4 kg (265 lb 6.9 oz)  SpO2:        Intake/Output Summary (Last 24 hours) at 10/14/12 1336 Last data filed at 10/14/12 0900  Gross per 24 hour  Intake 1002.83 ml  Output   1128 ml  Net -125.17 ml    Exam:   General:  Pt is alert, follows commands appropriately, not in acute distress  Cardiovascular: Regular rate and rhythm, S1/S2, no murmurs, no rubs, no gallops  Respiratory: Clear to auscultation bilaterally, no wheezing, no crackles, no rhonchi  Abdomen: Distended, nontender, sluggish bowel sounds  Extremities: Right upper extremity swelling, pulses DP and PT palpable bilaterally  Neuro: Grossly nonfocal  Data  Reviewed: Basic Metabolic Panel:  Lab 10/14/12 6962 10/13/12 0421 10/12/12 0430 10/11/12 0425 10/10/12 0645  NA 135 137 139 136 138  K 4.1 4.1 4.0 4.2 4.5  CL 100 100 102 99 100  CO2 27 28 27 28 27   GLUCOSE 113* 108* 106* 121* 134*  BUN 25* 28* 30* 37* 47*  CREATININE 1.25 1.26 1.26 1.64* 2.15*  CALCIUM 8.7 8.7 8.4 8.3* 8.2*   Liver Function Tests:  Lab 10/09/12 2240 10/03/2012 2125  AST 44* 101*  ALT 35 47  ALKPHOS 109 124*  BILITOT 0.6 1.7*  PROT 7.2 8.3  ALBUMIN 2.6* 3.2*   CBC:  Lab 10/14/12 0522 10/13/12 0421 10/12/12 0430 10/11/12 0425 10/10/12 0645  WBC 8.2 6.6 7.6 7.7 7.2  HGB 9.2* 9.0* 8.9* 9.0* 9.6*  HCT 32.2* 30.5* 30.0* 30.8* 32.1*  MCV 83.0 82.7 81.7 82.4 82.7  PLT 217 205 177 175 165   Cardiac Enzymes:  Lab 10/08/12 1334 10/08/12 0830 10/08/12 0241  TROPONINI <0.30 <0.30 <0.30   CBG:  Lab 10/14/12 1151 10/14/12 0739 10/13/12 1652 10/13/12 1141 10/13/12 0744  GLUCAP 93 136* 139* 134* 130*    Recent Results (from the past 240 hour(s))  MRSA PCR SCREENING     Status: Normal   Collection Time   10/08/12  4:21 AM      Component Value Range Status Comment   MRSA by PCR NEGATIVE  NEGATIVE Final      Studies: Ct Abdomen Pelvis Wo Contrast 10/12/2012  * IMPRESSION:  1.  Bilateral small pleural effusion with bilateral basilar posterior atelectasis. 2.  There is a significant distention of the right colon and hepatic flexure of the colon with stools suspicious for fecal impaction.  Colonic diameter measures at least 9 cm. 3.  There is distention of the transverse colon with gas measures 8.5 cm in diameter suspicious for colonic ileus.  The left colon and sigmoid colon are collapsed.  No definite obstructing colonic mass is identified.  Correlation with colonoscopy is recommended as clinically warranted. 4.  No small bowel obstruction. 5.  There is anasarca infiltration of the subcutaneous fat abdominal wall pelvic wall and bilateral flank wall. 6.  There is a  hematoma in the lower aspect of the left rectus muscle.  Measures 7.2 x 7.8 cm extending about 10 cm cranial caudally  Dg Abd Portable 1v 10/14/2012    IMPRESSION: Relatively unchanged very large amount of stool within the right and transverse colon.   Original Report Authenticated By: Harmon Pier, M.D.     Scheduled Meds:   . bisacodyl  10 mg Rectal Daily  . diltiazem  60 mg Oral Q6H  . docusate sodium  100 mg Oral BID  . enoxaparin (LOVENOX)   120 mg Subcutaneous Q12H  . hydrALAZINE  10 mg Intravenous Q6H  . insulin aspart  0-15 Units Subcutaneous TID WC  . isosorbide mononitrate  60 mg Oral Daily  . methocarbamol   500 mg Intravenous Q8H  . OxyCODONE  20 mg Oral Q12H  . polyethylene glycol   1 Container Oral Daily  . Tamsulosin HCl  0.4 mg Oral Daily  . tiotropium  18 mcg Inhalation Daily

## 2012-10-14 NOTE — Progress Notes (Signed)
Pt given second tap water enema at 2345 per MD order to continue with enema treatment. Total of 1000cc given via enema. PT tolerated enema well. Resulted in no formed stool but large amount of watery brown stool. Will give last enema in 4 hours per MD order. Will continue to monitor pt. - Christell Faith, RN

## 2012-10-14 NOTE — Progress Notes (Signed)
Pt given tap water enema about 850cc. Pt able to retain fluid for a few minutes. Pt had a large BM after enema, filling up 1 1/2 bedpans with bm. BM was loose and with some soft bm and brown in color. Pt's abdomen felt slightly softer after bm. Pt stated he felt some relief after bm. Pt encouraged to continue drinking Miralax, as he has not made very much progress thus far. Night shift RN asked to follow up on miralax intake.   Arta Bruce [facility]

## 2012-10-14 NOTE — Progress Notes (Signed)
Pt is refusing to wear CPAP. He does not like it and that it is uncomfortable. He feels that he is being smothered. RT told pt that if he changes his mind and wants to wear CPAP to call. RT will continue to monitor.

## 2012-10-15 ENCOUNTER — Inpatient Hospital Stay (HOSPITAL_COMMUNITY): Payer: 59

## 2012-10-15 DIAGNOSIS — E1159 Type 2 diabetes mellitus with other circulatory complications: Secondary | ICD-10-CM

## 2012-10-15 DIAGNOSIS — I739 Peripheral vascular disease, unspecified: Secondary | ICD-10-CM

## 2012-10-15 LAB — BASIC METABOLIC PANEL
BUN: 24 mg/dL — ABNORMAL HIGH (ref 6–23)
Chloride: 98 mEq/L (ref 96–112)
Creatinine, Ser: 1.18 mg/dL (ref 0.50–1.35)
GFR calc non Af Amer: 62 mL/min — ABNORMAL LOW (ref >90)
Glucose, Bld: 106 mg/dL — ABNORMAL HIGH (ref 70–99)
Potassium: 4 mEq/L (ref 3.5–5.1)

## 2012-10-15 LAB — GLUCOSE, CAPILLARY
Glucose-Capillary: 114 mg/dL — ABNORMAL HIGH (ref 70–99)
Glucose-Capillary: 147 mg/dL — ABNORMAL HIGH (ref 70–99)
Glucose-Capillary: 117 mg/dL — ABNORMAL HIGH (ref 70–99)
Glucose-Capillary: 117 mg/dL — ABNORMAL HIGH (ref 70–99)

## 2012-10-15 LAB — CBC
HCT: 30.2 % — ABNORMAL LOW (ref 39.0–52.0)
Hemoglobin: 8.8 g/dL — ABNORMAL LOW (ref 13.0–17.0)
MCH: 23.8 pg — ABNORMAL LOW (ref 26.0–34.0)
MCHC: 29.1 g/dL — ABNORMAL LOW (ref 30.0–36.0)
RDW: 24.5 % — ABNORMAL HIGH (ref 11.5–15.5)

## 2012-10-15 MED ORDER — WARFARIN SODIUM 7.5 MG PO TABS
7.5000 mg | ORAL_TABLET | Freq: Once | ORAL | Status: AC
  Administered 2012-10-15: 7.5 mg via ORAL
  Filled 2012-10-15: qty 1

## 2012-10-15 MED ORDER — FUROSEMIDE 10 MG/ML IJ SOLN
40.0000 mg | Freq: Two times a day (BID) | INTRAMUSCULAR | Status: DC
  Administered 2012-10-15 – 2012-10-16 (×2): 40 mg via INTRAVENOUS
  Filled 2012-10-15 (×3): qty 4

## 2012-10-15 MED ORDER — FUROSEMIDE 10 MG/ML IJ SOLN
40.0000 mg | Freq: Two times a day (BID) | INTRAMUSCULAR | Status: DC
  Administered 2012-10-15: 40 mg via INTRAVENOUS
  Filled 2012-10-15 (×2): qty 4

## 2012-10-15 MED ORDER — MORPHINE SULFATE 2 MG/ML IJ SOLN
1.0000 mg | INTRAMUSCULAR | Status: DC | PRN
  Administered 2012-10-15 – 2012-10-23 (×42): 2 mg via INTRAVENOUS
  Filled 2012-10-15 (×42): qty 1

## 2012-10-15 MED ORDER — DILTIAZEM HCL ER COATED BEADS 240 MG PO CP24
240.0000 mg | ORAL_CAPSULE | Freq: Every day | ORAL | Status: DC
  Administered 2012-10-15 – 2012-10-18 (×4): 240 mg via ORAL
  Filled 2012-10-15 (×4): qty 1

## 2012-10-15 MED ORDER — ENOXAPARIN SODIUM 120 MG/0.8ML ~~LOC~~ SOLN
120.0000 mg | Freq: Two times a day (BID) | SUBCUTANEOUS | Status: DC
  Administered 2012-10-15 – 2012-10-16 (×3): 120 mg via SUBCUTANEOUS
  Filled 2012-10-15 (×5): qty 0.8

## 2012-10-15 NOTE — Progress Notes (Signed)
Patient is to receive tap water enemas tid. Patient already received one earlier today but has refused to have another one at this time since he has had 3 large BMs since 0700. Patient states he will "probably do it tonight."  Will pass along to night RN.

## 2012-10-15 NOTE — Progress Notes (Signed)
Pt still finishing bottle of Miralax started at 1247 yesterday. Pt will drink about a half a cup at a time, but sleeps if not being kept aroused by staff. Pt not drinking Miralax on his own, staff encourages pt to drink and pt informed of plan of care. Have been monitoring pt and encouraging him to drink Miralax when in room. Will have day shift RN follow up with amount of Miralax finished by pt. Pt has about 1/3 of the bottle left to finish as of now. - Christell Faith, RN

## 2012-10-15 NOTE — Progress Notes (Signed)
Physical Therapy Treatment Patient Details Name: Terry [name] MRN: [id number] DOB: [birth year] Today's Date: 10/15/2012 Time: 1207-1250 PT Time Calculation (min): 43 min (only billed 2 units due to pt using restroom)  PT Assessment / Plan / Recommendation Comments on Treatment Session  Pt continues to be VERY limited with mobility and has little endurance.  He is now on 11L via venti mask with increased WOB with very little activity and requires +2 assist for all mobility.  Tolerated stand pivot to 3in1, however declined getting into chair today.  Continue to strongly recommend SNF, however pt refuses, therefore will need HHPT with 24/7 assist and may benefit from hospital bed.     Follow Up Recommendations  Home health PT;SNF;Supervision/Assistance - 24 hour     Does the patient have the potential to tolerate intense rehabilitation     Barriers to Discharge        Equipment Recommendations  Hospital bed    Recommendations for Other Services OT consult  Frequency Min 3X/week   Plan Discharge plan remains appropriate    Precautions / Restrictions Precautions Precautions: Back;Fall Precaution Comments: multiple back surgeries, weeping blisters  LLE,  Restrictions Weight Bearing Restrictions: No Other Position/Activity Restrictions: Pt with recent tib/fib fx in Dec and was told to put minimum weight on RLE   Pertinent Vitals/Pain Max c/o pain in LEs, pain medicine given.     Mobility  Bed Mobility Bed Mobility: Supine to Sit;Sit to Supine Supine to Sit: 3: Mod assist;HOB elevated;With rails Sit to Supine: 3: Mod assist;HOB elevated;With rail Details for Bed Mobility Assistance: Assist for LEs off EOB, more assist for LLE and also assist for BLEs into bed.  Very little assist needed for trunk, however provided mod cues for hand placement on rails to better self assist.  Transfers Transfers: Sit to Stand;Stand to Sit;Stand Pivot Transfers Sit to Stand: 1: +2 Total assist;From  bed Sit to Stand: Patient Percentage: 60% Stand to Sit: To bed;1: +2 Total assist;To elevated surface;To chair/3-in-1 Stand to Sit: Patient Percentage: 60% Stand Pivot Transfers: 1: +2 Total assist Stand Pivot Transfers: Patient Percentage: 60% Details for Transfer Assistance: Performed x 2 in order to use 3in1 at bedside.  Pt declined getting into chair today.  Assist to rise and steady with max cues for hand placement and LE management when sitting/standing.  Pt performed stand pivot twice to/from bed with cues for sequencing/technique with RW.   Ambulation/Gait Assistive device: Rolling walker    Exercises     PT Diagnosis:    PT Problem List:   PT Treatment Interventions:     PT Goals Acute Rehab PT Goals PT Goal Formulation: With patient Time For Goal Achievement: 10/23/12 Potential to Achieve Goals: Fair Pt will go Supine/Side to Sit: with supervision PT Goal: Supine/Side to Sit - Progress: Progressing toward goal Pt will go Sit to Supine/Side: with supervision PT Goal: Sit to Supine/Side - Progress: Progressing toward goal Pt will go Sit to Stand: with min assist PT Goal: Sit to Stand - Progress: Progressing toward goal Pt will go Stand to Sit: with supervision PT Goal: Stand to Sit - Progress: Progressing toward goal Pt will Transfer Bed to Chair/Chair to Bed: with min assist PT Transfer Goal: Bed to Chair/Chair to Bed - Progress: Progressing toward goal  Visit Information  Last PT Received On: 10/15/12 Assistance Needed: +2    Subjective Data  Subjective: I just need a few more hours.  Patient Stated Goal: to return home  Cognition  Overall Cognitive Status: Appears within functional limits for tasks assessed/performed Arousal/Alertness: Awake/alert Orientation Level: Appears intact for tasks assessed Behavior During Session: [facility] for tasks performed    Balance     End of Session PT - End of Session Equipment Utilized During Treatment: Gait belt;Oxygen (11L  venti mask) Activity Tolerance: Patient limited by fatigue;Patient limited by pain Patient left: in bed;with call bell/phone within reach;with family/visitor present Nurse Communication: Mobility status   GP     Vista Deck 10/15/2012, 1:16 PM

## 2012-10-15 NOTE — Progress Notes (Signed)
ANTICOAGULATION CONSULT NOTE - Follow Up Consult  Pharmacy Consult for Lovenox, Warfarin Indication: new RUE DVT  No Known Allergies  Patient Measurements: Height: 6\' 1"  (185.4 cm) Weight: 261 lb 7.5 oz (118.6 kg) IBW/kg (Calculated) : 79.9   Vital Signs: Temp: 97.7 F (36.5 C) (01/29 0450) Temp src: Oral (01/29 0450) BP: 136/79 mmHg (01/29 0645) Pulse Rate: 110  (01/29 0645)  Labs:  Basename 10/15/12 0610 10/14/12 1429 10/14/12 0522 10/13/12 0421  HGB 8.8* -- 9.2* --  HCT 30.2* -- 32.2* 30.5*  PLT 219 -- 217 205  APTT -- -- -- --  LABPROT 14.8 14.6 -- --  INR 1.18 1.16 -- --  HEPARINUNFRC -- -- -- --  CREATININE 1.18 -- 1.25 1.26  CKTOTAL -- -- -- --  CKMB -- -- -- --  TROPONINI -- -- -- --    Estimated Creatinine Clearance: 82 ml/min (by C-G formula based on Cr of 1.18).   Medications:  Scheduled:    . antiseptic oral rinse  15 mL Mouth Rinse BID  . bisacodyl  10 mg Rectal Daily  . [COMPLETED] coumadin book   Does not apply Once  . diltiazem  240 mg Oral Daily  . docusate sodium  100 mg Oral BID  . enoxaparin (LOVENOX) injection  120 mg Subcutaneous Q12H  . [COMPLETED] furosemide  40 mg Intravenous Once  . furosemide  40 mg Intravenous Q12H  . hydrALAZINE  10 mg Intravenous Q6H  . insulin aspart  0-15 Units Subcutaneous TID WC  . isosorbide mononitrate  60 mg Oral Daily  . methocarbamol (ROBAXIN) IV  500 mg Intravenous Q8H  . OxyCODONE  20 mg Oral Q12H  . [COMPLETED] polyethylene glycol powder  1 Container Oral Daily  . sodium chloride  10-40 mL Intracatheter Q12H  . Tamsulosin HCl  0.4 mg Oral Daily  . tiotropium  18 mcg Inhalation Daily  . [COMPLETED] warfarin  7.5 mg Oral ONCE-1800  . [COMPLETED] warfarin   Does not apply Once  . Warfarin - Pharmacist Dosing Inpatient   Does not apply q1800  . [DISCONTINUED] cefTRIAXone (ROCEPHIN)  IV  1 g Intravenous Q24H  . [DISCONTINUED] diltiazem  60 mg Oral Q6H    Assessment: 66 yom on Lovenox for new RUE  DVT (10/15/11) near PICC insertion side.  Right sided PICC removed, placed new PICC in left.  Pt is s/p tibia IM nail insertion 08/17/2012.  He was admit 19-Oct-2012 with urinary retention, acute resp failure, hypoxia.  Lovenox was given 1/21 - 1/23 for r/o VTE, d/c after negative doppler, but restarted 1/28.  INR remains subtherapeutic after just one dose of warfarin  SCr stable/improved, CrCl ~ 62 ml/min (normalized)  CBC: Plt wnl, Hgb decreased to 8.8 Patient will need a minimum of 5 days warfarin / Lovenox overlap and until INR > 2 for 24 hours.   Goal of Therapy:  INR 2-3 Anti-Xa level 0.6-1.2 units/ml 4hrs after LMWH dose given Monitor platelets by anticoagulation protocol: Yes   Plan:   Continue Lovenox 120mg  SQ q12h  Warfarin 7.5mg  PO tonight x1  Daily INR  CBC q72 hours while on Lovenox (MD already ordered daily CBC  Warfarin education prior to pt discharge   Lynann Beaver PharmD, BCPS Pager [phone] 10/15/2012 12:47 PM

## 2012-10-15 NOTE — Progress Notes (Signed)
[facility] PROGRESS NOTE  FERMAN BASILIO NWG:[id number] DOB: 02-22-1946 DOA: 11/05/12 PCP: No primary provider on file.  Brief narrative: 67 y/o obese Male with hx of HTN, DM, PVD,  diastolic dysfunction, COPD with cor pulmonale, urinary retention on foley , chronic low back pain,recent right tibial fracture repair presented to ED with no urine output for 24 hrs and found to be hypoxic.  He was  admitted for evaluation of hypoxia. VQ scan showed low probability for pulmonary embolism. Lower extremity doppler study negative for DVT. He was started on Lasix in ED for mild interstitial edema based on chest x-ray. In addition, creatinine was 1.65 on this admission. Lasix was temporarily discontinued due to development of hypotension and worsening kidney function.  Patient continued to experience abdominal pain and distention. Abdominal x-ray done on 01/24 showed possible constipation. Repeat abdominal x-ray showed possible colonic ileus and CT abdomen/pelvis done 01/26 shows concern for the same.    Assessment/Plan:  Abdominal distention, pain  Secondary to colonic ileus   increased bowel regimen to include Colace, MiraLAX and bisacodyl. Patient was on quite an extensive pain medication regimen which has been cut down to include morphine 1-2 mg every 6 hours as needed IV for pain, oxycodone 20 mg every 12 hours scheduled by mouth and tramadol 50 mg every 6 hours as needed. Patient currently on  enemas TID as per GI recommendations. Had BM on 1/28. Gi singed off.    Active Problems:  Right upper extremity DVT  In proximal right axillary vein  Dr Elisabeth Pigeon spoke with PCCM on call and recommendation was to take PICC line out and continue anticoagulation for next 3 months . Left UE PICC placed.  Lovenox and coumadin per pharmacy. Can  be discharged on lovenox alone.  Acute respiratory failure with hypoxia  Likely secondary to COPD exacerbation / acute diastolic dysfunction VQ scan showed low  probability for PE; lower extremity doppler study negative for DVT  Requiring facemask with almost 8-10 L O2. Will continue with scheduled nebs. IV lasix started. Monitor I/O. Recent echo with diastolic dysfn and cor pulmonale. Will get cardiology evaluation if unimproved. Was seen by [facility] in past.   Hypertensive heart disease  Added Cardizem 60 mg Q6 hours for sinus tacycardia. Will switch to long acting Continue Imdur 60 mg daily and add hydralazine 10 mg every 6 hours IV  COPD (chronic obstructive pulmonary disease)   Continue Spiriva. On facemask. Will titrate as tolerated. Continue nebs  Diastolic CHF, Acute on chronic  Pro BNP on admission 5080 . A 2-D echo was done in December 2013 with normal ejection fraction and grade 1 diastolic dysfunction. However has  cor pulmonale with severe  PAH. Lasix discontinued with worsening renal function. Given 40 mg IV lasx on 1/28. He clinically appears volume overload with SOB and leg edema. Will place him on IV lasix ( bid). Was supposed to be on po lasix as outpatient. There is a strong possibility of medication non compliance.   Urinary retention  Secondary to chronic urethral stricture Foley in place and has  good urine output  UTI (lower urinary tract infection)  On rocephin prior to urine cx obtained. Completed 7 day course.  Fracture of tibia, distal, right, closed  Stable . Continue pain meds and encouraged PT.  Bilateral knee pain  Seems to be chronic and now has painful LLE blisters. Continue dressing . Pain control and PT.   Diabetes mellitus type 2 with complications  A1c 7.3  Continue sliding scale insulin  Appreciate diabetic coordinator recommendations  Anemia of chronic disease  No signs of active bleed  Hemoglobin stable, range 8.9 - 9.2  Acute kidney injury  Secondary to UTI, urinary retention and Lasix  Resolved. Will resume lasix  Obesity (BMI 30-39.9)  Nutrition consulted     Code Status: Full code  Family  Communication: Wife at bedside  Disposition Plan: needs PT eval.       Consultants:  [facility]  Procedures:  PICC line.  Antibiotics:  Completed 7days of rocephin  HPI/Subjective: Still has b/l leg pain. Still on face mask with SOB. Had one BM after enema yesterday.  Objective: Filed Vitals:   10/15/12 0010 10/15/12 0450 10/15/12 0500 10/15/12 0645  BP: 158/75 180/100 168/76 136/79  Pulse: 106 110  110  Temp:  97.7 F (36.5 C)    TempSrc:  Oral    Resp:  18    Height:      Weight:  118.6 kg (261 lb 7.5 oz)    SpO2:  98%      Intake/Output Summary (Last 24 hours) at 10/15/12 1026 Last data filed at 10/14/12 2100  Gross per 24 hour  Intake    487 ml  Output   1275 ml  Net   -788 ml   Filed Weights   10/13/12 0500 10/14/12 0800 10/15/12 0450  Weight: 118 kg (260 lb 2.3 oz) 120.4 kg (265 lb 6.9 oz) 118.6 kg (261 lb 7.5 oz)    Exam:   General:  Elderly obese male on face mask in distress with LE pain  Heent: no pallor, moist oral mucosa, JVD not appreciated  Cardiovascular: NS 1&S2, no murmurs, rub or gallop  Respiratory: equal air entry b/l. No crackles or rhonchi heard  Abdomen: soft, NT, distended. BS+  EXT: Warm, 1 + pitting edema b/l, blisters over left tibia. tender b/l LE.   Cns: AAOx 3, non focal  Data Reviewed: Basic Metabolic Panel:  Lab 10/15/12 1610 10/14/12 0522 10/13/12 0421 10/12/12 0430 10/11/12 0425  NA 134* 135 137 139 136  K 4.0 4.1 4.1 4.0 4.2  CL 98 100 100 102 99  CO2 27 27 28 27 28   GLUCOSE 106* 113* 108* 106* 121*  BUN 24* 25* 28* 30* 37*  CREATININE 1.18 1.25 1.26 1.26 1.64*  CALCIUM 8.7 8.7 8.7 8.4 8.3*  MG -- -- -- -- --  PHOS -- -- -- -- --   Liver Function Tests:  Lab 10/09/12 2240  AST 44*  ALT 35  ALKPHOS 109  BILITOT 0.6  PROT 7.2  ALBUMIN 2.6*   No results found for this basename: LIPASE:5,AMYLASE:5 in the last 168 hours No results found for this basename: AMMONIA:5 in the last 168  hours CBC:  Lab 10/15/12 0610 10/14/12 0522 10/13/12 0421 10/12/12 0430 10/11/12 0425  WBC 6.8 8.2 6.6 7.6 7.7  NEUTROABS -- -- -- -- 5.7  HGB 8.8* 9.2* 9.0* 8.9* 9.0*  HCT 30.2* 32.2* 30.5* 30.0* 30.8*  MCV 81.8 83.0 82.7 81.7 82.4  PLT 219 217 205 177 175   Cardiac Enzymes:  Lab 10/08/12 1334  CKTOTAL --  CKMB --  CKMBINDEX --  TROPONINI <0.30   BNP (last 3 results)  Basename 10/13/12 1403 09/17/2012 2125 08/22/12 0456  PROBNP 2380.0* 5080.0* 5872.0*   CBG:  Lab 10/14/12 1730 10/14/12 1151 10/14/12 0739 10/13/12 1652 10/13/12 1141  GLUCAP 103* 93 136* 139* 134*    Recent Results (from the past 240  hour(s))  MRSA PCR SCREENING     Status: Normal   Collection Time   10/08/12  4:21 AM      Component Value Range Status Comment   MRSA by PCR NEGATIVE  NEGATIVE Final      Studies: Dg Chest Port 1 View  10/15/2012  *RADIOLOGY REPORT*  Clinical Data: Left PICC placement.  PORTABLE CHEST - 1 VIEW  Comparison: Portable chest 11-05-12.  Findings: The patient has a new left PICC with its tip projecting over the lower superior vena cava.  A new right PICC is also identified with the tip projecting over the mid superior vena cava. Cardiomegaly and vascular congestion persist.  Small left effusion and basilar atelectasis are noted.  No pneumothorax.  IMPRESSION:  1.  Tip of left PICC projects over the lower superior vena cava. 2.  Tip of right PICC projects over the mid superior vena cava. 3.  Cardiomegaly and vascular congestion. 4.  Small left pleural effusion and mild basilar atelectasis.   Original Report Authenticated By: Holley Dexter, M.D.    Dg Abd Portable 1v  10/14/2012  *RADIOLOGY REPORT*  Clinical Data: Abdominal distention and pain.  Large stool burden.  PORTABLE ABDOMEN - 1 VIEW  Comparison: 10/12/2012 CT and radiographs  Findings: A very large amount of stool within the right and transverse colon again noted. A small amount of gas is noted within the distal colon. No  significant changes are present.  IMPRESSION: Relatively unchanged very large amount of stool within the right and transverse colon.   Original Report Authenticated By: Harmon Pier, M.D.     Scheduled Meds:   . antiseptic oral rinse  15 mL Mouth Rinse BID  . bisacodyl  10 mg Rectal Daily  . diltiazem  60 mg Oral Q6H  . docusate sodium  100 mg Oral BID  . enoxaparin (LOVENOX) injection  120 mg Subcutaneous Q12H  . furosemide  40 mg Intravenous Q12H  . hydrALAZINE  10 mg Intravenous Q6H  . insulin aspart  0-15 Units Subcutaneous TID WC  . isosorbide mononitrate  60 mg Oral Daily  . methocarbamol (ROBAXIN) IV  500 mg Intravenous Q8H  . OxyCODONE  20 mg Oral Q12H  . sodium chloride  10-40 mL Intracatheter Q12H  . Tamsulosin HCl  0.4 mg Oral Daily  . tiotropium  18 mcg Inhalation Daily  . Warfarin - Pharmacist Dosing Inpatient   Does not apply q1800     Time spent: 35 MINUTES    Messi Twedt  [facility] Pager [phone]. If 8PM-8AM, please contact night-coverage at [email or web address], password [facility] 10/15/2012, 10:26 AM  LOS: 8 days

## 2012-10-15 NOTE — Progress Notes (Signed)
Pt is refusing to wear CPAP, states that he feels like he is suffocating when he wears it. Pt is resting comfortably. Pt was told to call RT if he changes his mind and would like to go on CPAP at anytime. RT will continue to monitor.

## 2012-10-15 NOTE — Progress Notes (Signed)
Pt given 1,000cc tap water enema at 0015.Pt tolerated well. Pt had large, loose, brown BM at 0100. BM overflowed bedpan. Pt cleaned up and encouraged to continue drinking Miralax. Pt still not making optimum progress so far on finishing Miralax bottle. Will continue to have pt drink Miralax when RN in room and continue to monitor. - Christell Faith, RN

## 2012-10-16 DIAGNOSIS — M7989 Other specified soft tissue disorders: Secondary | ICD-10-CM

## 2012-10-16 DIAGNOSIS — M79609 Pain in unspecified limb: Secondary | ICD-10-CM

## 2012-10-16 LAB — GLUCOSE, CAPILLARY
Glucose-Capillary: 112 mg/dL — ABNORMAL HIGH (ref 70–99)
Glucose-Capillary: 121 mg/dL — ABNORMAL HIGH (ref 70–99)

## 2012-10-16 LAB — BLOOD GAS, ARTERIAL
Bicarbonate: 27.9 mEq/L — ABNORMAL HIGH (ref 20.0–24.0)
O2 Saturation: 93 %
Patient temperature: 98.6
TCO2: 25.5 mmol/L (ref 0–100)
pH, Arterial: 7.465 — ABNORMAL HIGH (ref 7.350–7.450)
pO2, Arterial: 65.8 mmHg — ABNORMAL LOW (ref 80.0–100.0)

## 2012-10-16 LAB — PROTIME-INR: Prothrombin Time: 16.4 seconds — ABNORMAL HIGH (ref 11.6–15.2)

## 2012-10-16 LAB — BASIC METABOLIC PANEL
CO2: 28 mEq/L (ref 19–32)
Calcium: 8.7 mg/dL (ref 8.4–10.5)
Creatinine, Ser: 1.21 mg/dL (ref 0.50–1.35)
GFR calc Af Amer: 70 mL/min — ABNORMAL LOW (ref >90)
GFR calc non Af Amer: 60 mL/min — ABNORMAL LOW (ref >90)
Sodium: 135 mEq/L (ref 135–145)

## 2012-10-16 LAB — CBC
MCV: 80.9 fL (ref 78.0–100.0)
Platelets: 248 10*3/uL (ref 150–400)
RDW: 24.7 % — ABNORMAL HIGH (ref 11.5–15.5)
WBC: 6.3 10*3/uL (ref 4.0–10.5)

## 2012-10-16 MED ORDER — GABAPENTIN 100 MG PO CAPS
100.0000 mg | ORAL_CAPSULE | Freq: Three times a day (TID) | ORAL | Status: DC
  Administered 2012-10-16 – 2012-10-17 (×5): 100 mg via ORAL
  Filled 2012-10-16 (×9): qty 1

## 2012-10-16 MED ORDER — FUROSEMIDE 10 MG/ML IJ SOLN
80.0000 mg | Freq: Two times a day (BID) | INTRAMUSCULAR | Status: DC
  Administered 2012-10-16 – 2012-10-18 (×4): 80 mg via INTRAVENOUS
  Filled 2012-10-16 (×5): qty 8

## 2012-10-16 MED ORDER — WARFARIN SODIUM 7.5 MG PO TABS
7.5000 mg | ORAL_TABLET | Freq: Once | ORAL | Status: AC
  Administered 2012-10-16: 7.5 mg via ORAL
  Filled 2012-10-16: qty 1

## 2012-10-16 MED ORDER — SPIRONOLACTONE 25 MG PO TABS
25.0000 mg | ORAL_TABLET | Freq: Two times a day (BID) | ORAL | Status: DC
  Administered 2012-10-17 – 2012-10-20 (×8): 25 mg via ORAL
  Filled 2012-10-16 (×9): qty 1

## 2012-10-16 NOTE — Progress Notes (Signed)
[facility] PROGRESS NOTE  Terry [name] WUJ:[id number] DOB: [birth year] DOA: 09/24/2012 PCP: No primary provider on file.  Brief narrative:  67 y/o obese Male with hx of HTN, DM, PVD, diastolic dysfunction, COPD with cor pulmonale, urinary retention on foley , chronic low back pain,recent right tibial fracture repair presented to ED with no urine output for 24 hrs and found to be hypoxic.  He was admitted for evaluation of hypoxia. VQ scan showed low probability for pulmonary embolism. Lower extremity doppler study negative for DVT. He was started on Lasix in ED for mild interstitial edema based on chest x-ray. In addition, creatinine was 1.65 on this admission. Lasix was temporarily discontinued due to development of hypotension and worsening kidney function.  Patient continued to experience abdominal pain and distention. Abdominal x-ray done on 01/24 showed possible constipation. Repeat abdominal x-ray showed possible colonic ileus and CT abdomen/pelvis done 01/26 shows concern for the same.   Assessment/Plan:  Abdominal distention, pain  Secondary to colonic ileus  increased bowel regimen to include Colace, MiraLAX and bisacodyl. Patient was on quite an extensive pain medication regimen which has been cut down to include morphine 1-2 mg every 6 hours as needed IV for pain, oxycodone 20 mg every 12 hours scheduled by mouth and tramadol 50 mg every 6 hours as needed. Patient currently on enemas TID as per GI recommendations.  Had a good bowel movement yesterday and symptoms much improved.  Active Problems:  Right upper extremity DVT  In proximal right axillary vein  Dr Elisabeth Pigeon spoke with PCCM on call and recommendation was to take PICC line out and continue anticoagulation for next 3 months . Left UE PICC placed.  Lovenox and coumadin per pharmacy. Can be discharged on lovenox alone.   Acute respiratory failure with hypoxia  Likely secondary to COPD exacerbation / acute diastolic  dysfunction VQ scan showed low probability for PE; lower extremity doppler study negative for DVT  Requiring facemask with almost 8-10 L O2. Will continue with scheduled nebs. IV lasix started and does. Monitor I/O. Recent echo with diastolic dysfn and cor pulmonale. Patient follows with Dr. Donnie Aho as outpatient. Will ask for evaluation -ABG showing hypoxemia.  Hypertensive heart disease  Added Cardizem 60 mg Q6 hours for sinus tacycardia. Switch to long acting Cardizem Continue Imdur 60 mg daily and add hydralazine 10 mg every 6 hours IV   COPD (chronic obstructive pulmonary disease)  Continue Spiriva. On facemask. Will titrate as tolerated. Continue nebs   Diastolic CHF, Acute on chronic  Pro BNP on admission 5080 . A 2-D echo was done in December 2013 with normal ejection fraction and grade 1 diastolic dysfunction. However has cor pulmonale with severe PAH.  Lasix discontinued with worsening renal function. Given 40 mg IV lasx on 1/28. He clinically appears volume overload with SOB and leg edema. Started him on IV Lasix 40 mg twice a day. Increase it further to 80 mg twice a day as he is still short of breath. Was supposed to be on po lasix as outpatient. There is a strong possibility of medication non compliance.   Urinary retention  Secondary to chronic urethral stricture Foley in place and has good urine output   UTI (lower urinary tract infection)  On rocephin prior to urine cx obtained. Completed 7 day course.  Fracture of tibia, distal, right, closed  Stable . Continue pain meds and encouraged PT.   Bilateral leg pain Seems to be chronic and now has painful LLE blisters.  Continue dressing . Continue when necessary morphine and oxycodone Will add Neurontin for pain control.  Diabetes mellitus type 2 with complications  A1c 7.3  Continue sliding scale insulin  Appreciate diabetic coordinator recommendations   Anemia of chronic disease  No signs of active bleed  Hemoglobin  stable, range 8.9 - 9.2   Acute kidney injury  Secondary to UTI, urinary retention and Lasix  Resolved. Resumed Lasix  Obesity (BMI 30-39.9)  Nutrition consulted   Code Status: Full code  Family Communication: Wife at bedside  Disposition Plan: Home health PT versus SNF once symptoms improved   Consultants:  [facility] Procedures:  PICC line. Antibiotics:  Completed 7days of rocephin     HPI/Subjective: Patient still short of breath requiring face mask. Has had good bowel movements yesterday and feels his abdominal discomfort to have markedly improved. Still complains of a lot of pain in his legs.  Objective: Filed Vitals:   10/16/12 0432 10/16/12 0451 10/16/12 1013 10/16/12 1335  BP: 159/91 164/95  130/77  Pulse:    125  Temp:  97.6 F (36.4 C)  98.2 F (36.8 C)  TempSrc:  Oral  Oral  Resp:  18  20  Height:      Weight:  116.7 kg (257 lb 4.4 oz)    SpO2:  97% 91% 99%    Intake/Output Summary (Last 24 hours) at 10/16/12 1401 Last data filed at 10/16/12 0552  Gross per 24 hour  Intake 931.33 ml  Output   1551 ml  Net -619.67 ml   Filed Weights   10/14/12 0800 10/15/12 0450 10/16/12 0451  Weight: 120.4 kg (265 lb 6.9 oz) 118.6 kg (261 lb 7.5 oz) 116.7 kg (257 lb 4.4 oz)    Exam: General: Elderly obese male on face mask in distress with LE pain  Heent: no pallor, moist oral mucosa, JVD not appreciated  Cardiovascular: NS 1&S2, no murmurs, rub or gallop  Respiratory: equal air entry b/l. No crackles or rhonchi heard  Abdomen: soft, NT, distended. BS+  EXT: Warm, 1 + pitting edema b/l, blisters over left tibia. tender b/l LE.  Cns: AAOx 3, non focal   Data Reviewed: Basic Metabolic Panel:  Lab 10/16/12 1610 10/15/12 0610 10/14/12 0522 10/13/12 0421 10/12/12 0430  NA 135 134* 135 137 139  K 4.1 4.0 4.1 4.1 4.0  CL 98 98 100 100 102  CO2 28 27 27 28 27   GLUCOSE 111* 106* 113* 108* 106*  BUN 24* 24* 25* 28* 30*  CREATININE 1.21 1.18 1.25 1.26 1.26   CALCIUM 8.7 8.7 8.7 8.7 8.4  MG -- -- -- -- --  PHOS -- -- -- -- --   Liver Function Tests:  Lab 10/09/12 2240  AST 44*  ALT 35  ALKPHOS 109  BILITOT 0.6  PROT 7.2  ALBUMIN 2.6*   No results found for this basename: LIPASE:5,AMYLASE:5 in the last 168 hours No results found for this basename: AMMONIA:5 in the last 168 hours CBC:  Lab 10/16/12 0447 10/15/12 0610 10/14/12 0522 10/13/12 0421 10/12/12 0430 10/11/12 0425  WBC 6.3 6.8 8.2 6.6 7.6 --  NEUTROABS -- -- -- -- -- 5.7  HGB 9.0* 8.8* 9.2* 9.0* 8.9* --  HCT 30.4* 30.2* 32.2* 30.5* 30.0* --  MCV 80.9 81.8 83.0 82.7 81.7 --  PLT 248 219 217 205 177 --   Cardiac Enzymes: No results found for this basename: CKTOTAL:5,CKMB:5,CKMBINDEX:5,TROPONINI:5 in the last 168 hours BNP (last 3 results)  Basename 10/13/12 1403  10/06/2012 2125 08/22/12 0456  PROBNP 2380.0* 5080.0* 5872.0*   CBG:  Lab 10/15/12 2039 10/15/12 1602 10/15/12 1135 10/15/12 0747 10/14/12 1730  GLUCAP 117* 117* 147* 114* 103*    Recent Results (from the past 240 hour(s))  MRSA PCR SCREENING     Status: Normal   Collection Time   10/08/12  4:21 AM      Component Value Range Status Comment   MRSA by PCR NEGATIVE  NEGATIVE Final      Studies: Dg Chest Port 1 View  10/15/2012  *RADIOLOGY REPORT*  Clinical Data: Left PICC placement.  PORTABLE CHEST - 1 VIEW  Comparison: Portable chest 09/17/2012.  Findings: The patient has a new left PICC with its tip projecting over the lower superior vena cava.  A new right PICC is also identified with the tip projecting over the mid superior vena cava. Cardiomegaly and vascular congestion persist.  Small left effusion and basilar atelectasis are noted.  No pneumothorax.  IMPRESSION:  1.  Tip of left PICC projects over the lower superior vena cava. 2.  Tip of right PICC projects over the mid superior vena cava. 3.  Cardiomegaly and vascular congestion. 4.  Small left pleural effusion and mild basilar atelectasis.   Original  Report Authenticated By: Holley Dexter, M.D.     Scheduled Meds:   . antiseptic oral rinse  15 mL Mouth Rinse BID  . bisacodyl  10 mg Rectal Daily  . diltiazem  240 mg Oral Daily  . docusate sodium  100 mg Oral BID  . enoxaparin (LOVENOX) injection  120 mg Subcutaneous Q12H  . furosemide  80 mg Intravenous Q12H  . gabapentin  100 mg Oral TID  . hydrALAZINE  10 mg Intravenous Q6H  . insulin aspart  0-15 Units Subcutaneous TID WC  . isosorbide mononitrate  60 mg Oral Daily  . methocarbamol (ROBAXIN) IV  500 mg Intravenous Q8H  . OxyCODONE  20 mg Oral Q12H  . sodium chloride  10-40 mL Intracatheter Q12H  . Tamsulosin HCl  0.4 mg Oral Daily  . tiotropium  18 mcg Inhalation Daily  . warfarin  7.5 mg Oral ONCE-1800  . Warfarin - Pharmacist Dosing Inpatient   Does not apply q1800     Time spent: 25 minutes    Terry [name]  [facility] Pager [phone] If 8PM-8AM, please contact night-coverage at [email or web address], password [facility] 10/16/2012, 2:01 PM  LOS: 9 days

## 2012-10-16 NOTE — Consult Note (Addendum)
Admit date: 09/28/2012 Referring Physician  [facility], to Dr. Renda Rolls Primary Physician  unknown Primary Cardiologist  Vilinda Blanks. Donnie Aho, M.D. Reason for Consultation  right heart failure  ASSESSMENT: 1. Right heart failure with anasarca secondary to problem #2  2. Secondary pulmonary hypertension, multifactorial related to COPD, obesity, and diastolic heart failure  3. COPD with CO2 retention  4. Acute on chronic diastolic heart failure, with severe left ventricular hypertrophy by echo in 2013  5. Urethral stricture with obstructive uropathy, resolved after Foley  6. Morbid obesity  7. Peripheral vascular disease  8. History of hypertension  9. Junctional Tachycardia with loss of AV synchrony, aggravating both left and right heart failure.  PLAN:  1. Diuresis  2. Evaluate to exclude the possibility of significant CO2 retention; arterial blood gas  3. Chronic kidney disease will be aggravated by diuresis.  4. O2 therapy as is appropriate to prevent CO2 narcosis but keep O2 saturation greater than 90%  5. Consider adding a distal tubal blocker such as Aldactone to facilitate diuresis  6. EKG to evaluate tachycardia  7. If significant CO2 retention, get pulmonary consult.  HPI: We are consult in this gentleman because of dyspnea. He has a history of COPD, cor pulmonale, obesity, severe hypertension, diastolic heart failure, and renal impairment who was admitted to the hospital with urinary tract obstruction. Cardiology is asked to evaluate because of persistent severe right heart failure and dyspnea.   PMH:   Past Medical History  Diagnosis Date  . Diabetes mellitus without complication   . Peripheral vascular disease   . Hypertensive heart disease   . Obesity (BMI 30-39.9)   . Lumbar disc disease   . Anginal pain   . Hypertension      PSH:   Past Surgical History  Procedure Date  . Back surgery     numerous spine surgeries, 1966-2011  . Hip  replacement     bilateral, each replaced twice  . Tonsillectomy   . Tibia im nail insertion 08/17/2012    Procedure: INTRAMEDULLARY (IM) NAIL TIBIAL;  Surgeon: Kerrin Champagne, MD;  Location: [facility];  Service: Orthopedics;  Laterality: Right;  . Joint replacement Bilateral hip    Allergies:  Review of patient's allergies indicates no known allergies. Prior to Admit Meds:   Prescriptions prior to admission  Medication Sig Dispense Refill  . insulin aspart (NOVOLOG) 100 UNIT/ML injection Inject 0-15 Units into the skin 3 (three) times daily with meals. CBG < 70: Drink juice; CBG 70 - 120: 0 units: CBG 121 - 150: 2 units; CBG 151 - 200: 3 units; CBG 201 - 250: 5 units; CBG 251 - 300: 8 units;CBG 301 - 350: 11 units; CBG 351 - 400: 15 units; CBG > 400 : 15 units and notify MD  1 vial    . isosorbide mononitrate (IMDUR) 60 MG 24 hr tablet Take 60 mg by mouth daily.      Marland Kitchen losartan-hydrochlorothiazide (HYZAAR) 100-25 MG per tablet Take 1 tablet by mouth daily.      . metformin (FORTAMET) 500 MG (OSM) 24 hr tablet Take 500 mg by mouth 3 (three) times daily.      . metoprolol (LOPRESSOR) 50 MG tablet Take 50 mg by mouth 2 (two) times daily.      . OxyCODONE (OXYCONTIN) 40 mg T12A Take 1 tablet (40 mg total) by mouth every 12 (twelve) hours.  60 tablet  0  . polyethylene glycol (MIRALAX / GLYCOLAX) packet Take  17 g by mouth daily as needed. For constipation      . silodosin (RAPAFLO) 8 MG CAPS capsule Take 8 mg by mouth daily.      Marland Kitchen tiotropium (SPIRIVA) 18 MCG inhalation capsule Place 1 capsule (18 mcg total) into inhaler and inhale daily.  30 capsule    . albuterol (PROVENTIL) (5 MG/ML) 0.5% nebulizer solution Take 0.5 mLs (2.5 mg total) by nebulization 2 (two) times daily. And q4hours PRN for wheezing and SOB  20 mL     Fam HX:   History reviewed. No pertinent family history. Social HX:    History   Social History  . Marital Status: Married    Spouse Name: N/A    Number of Children: N/A  . Years  of Education: N/A   Occupational History  . Not on file.   Social History Main Topics  . Smoking status: Former Smoker -- 1.0 packs/day for 50 years    Types: Cigarettes    Quit date: 08/16/2012  . Smokeless tobacco: Never Used  . Alcohol Use: No     Comment: rarely  . Drug Use: No  . Sexually Active: Yes   Other Topics Concern  . Not on file   Social History Narrative   Divorced.   2 children. Remarried.  He is on disability and retired from [employer].     Review of Systems: The patient is lethargic and not able to coherently answer review of system items  Physical Exam: Blood pressure 130/77, pulse 125, temperature 98.2 F (36.8 C), temperature source Oral, resp. rate 20, height 6\' 1"  (1.854 m), weight 116.7 kg (257 lb 4.4 oz), SpO2 96.00%. Weight change: -3.7 kg (-8 lb 2.5 oz)  Markedly obese, lying in bed with some dyspnea relatively flat  Moderate to severe JVD  2 of 6 systolic murmur at the left lower sternal border. No gallop or diastolic murmur is heard.  Distended abdomen. A D- abdominal wall.  Lower extremities with 3+ edema.  Neurological exam is significant for lethargy   Labs:   Lab Results  Component Value Date   WBC 6.3 10/16/2012   HGB 9.0* 10/16/2012   HCT 30.4* 10/16/2012   MCV 80.9 10/16/2012   PLT 248 10/16/2012    Lab 10/16/12 0447 10/09/12 2240  NA 135 --  K 4.1 --  CL 98 --  CO2 28 --  BUN 24* --  CREATININE 1.21 --  CALCIUM 8.7 --  PROT -- 7.2  BILITOT -- 0.6  ALKPHOS -- 109  ALT -- 35  AST -- 44*  GLUCOSE 111* --   No results found for this basename: PTT   Lab Results  Component Value Date   INR 1.35 10/16/2012   INR 1.18 10/15/2012   INR 1.16 10/14/2012   Lab Results  Component Value Date   CKTOTAL 610* 10/18/2010   CKMB 11.4 CRITICAL RESULT CALLED TO, READ BACK BY AND VERIFIED WITH: K.MCCALL,RN 1455 10/18/10 CLARK,S* 10/18/2010   TROPONINI <0.30 10/08/2012     Lab Results  Component Value Date   CHOL  Value: 158         ATP III CLASSIFICATION:  <200     mg/dL   Desirable  782-956  mg/dL   Borderline High  >=213    mg/dL   High        0/04/6577   Lab Results  Component Value Date   HDL 58 10/18/2010   Lab Results  Component Value Date  LDLCALC  Value: 87        Total Cholesterol/HDL:CHD Risk Coronary Heart Disease Risk Table                     Men   Women  1/2 Average Risk   3.4   3.3  Average Risk       5.0   4.4  2 X Average Risk   9.6   7.1  3 X Average Risk  23.4   11.0        Use the calculated Patient Ratio above and the CHD Risk Table to determine the patient's CHD Risk.        ATP III CLASSIFICATION (LDL):  <100     mg/dL   Optimal  161-096  mg/dL   Near or Above                    Optimal  130-159  mg/dL   Borderline  045-409  mg/dL   High  >811     mg/dL   Very High 05/18/4781   Lab Results  Component Value Date   TRIG 66 10/18/2010   Lab Results  Component Value Date   CHOLHDL 2.7 10/18/2010   No results found for this basename: LDLDIRECT      Radiology:  Dg Chest Port 1 View  10/15/2012  *RADIOLOGY REPORT*  Clinical Data: Left PICC placement.  PORTABLE CHEST - 1 VIEW  Comparison: Portable chest 25-Oct-2012.  Findings: The patient has a new left PICC with its tip projecting over the lower superior vena cava.  A new right PICC is also identified with the tip projecting over the mid superior vena cava. Cardiomegaly and vascular congestion persist.  Small left effusion and basilar atelectasis are noted.  No pneumothorax.  IMPRESSION:  1.  Tip of left PICC projects over the lower superior vena cava. 2.  Tip of right PICC projects over the mid superior vena cava. 3.  Cardiomegaly and vascular congestion. 4.  Small left pleural effusion and mild basilar atelectasis.   Original Report Authenticated By: Holley Dexter, M.D.    EKG:    Veatrice Kells W 10/16/2012 8:05 PM

## 2012-10-16 NOTE — Progress Notes (Signed)
VASCULAR LAB PRELIMINARY  PRELIMINARY  PRELIMINARY  PRELIMINARY  Bilateral lower extremity venous duplex completed.    Preliminary report:  Bilateral:  No obvious evidence of DVT, superficial thrombosis, or Baker's Cyst. Technically difficult and somewhat limited as before No evidence of changes from study of 10/08/2012   Scout Gumbs, RVS 10/16/2012, 11:46 AM

## 2012-10-16 NOTE — Progress Notes (Signed)
Pt had a large loose, brown BM this am. Pt refusing dulcolax suppository and colace at this time. Pt also refusing the three tap water enemas that are ordered for today. Will continue to monitor pt's BM output.   Arta Bruce [facility] 10/16/2012 11:06 AM

## 2012-10-16 NOTE — Progress Notes (Signed)
ANTICOAGULATION CONSULT NOTE - Follow Up Consult  Pharmacy Consult for Lovenox, Warfarin Indication: new RUE DVT  No Known Allergies  Patient Measurements: Height: 6\' 1"  (185.4 cm) Weight: 257 lb 4.4 oz (116.7 kg) IBW/kg (Calculated) : 79.9   Vital Signs: Temp: 97.6 F (36.4 C) (01/30 0451) Temp src: Oral (01/30 0451) BP: 164/95 mmHg (01/30 0451) Pulse Rate: 112  (01/29 2033)  Labs:  Basename 10/16/12 0447 10/15/12 0610 10/14/12 1429 10/14/12 0522  HGB 9.0* 8.8* -- --  HCT 30.4* 30.2* -- 32.2*  PLT 248 219 -- 217  APTT -- -- -- --  LABPROT 16.4* 14.8 14.6 --  INR 1.35 1.18 1.16 --  HEPARINUNFRC -- -- -- --  CREATININE 1.21 1.18 -- 1.25  CKTOTAL -- -- -- --  CKMB -- -- -- --  TROPONINI -- -- -- --    Estimated Creatinine Clearance: 79.3 ml/min (by C-G formula based on Cr of 1.21).   Medications:  Scheduled:     . antiseptic oral rinse  15 mL Mouth Rinse BID  . bisacodyl  10 mg Rectal Daily  . diltiazem  240 mg Oral Daily  . docusate sodium  100 mg Oral BID  . enoxaparin (LOVENOX) injection  120 mg Subcutaneous Q12H  . furosemide  40 mg Intravenous Q12H  . hydrALAZINE  10 mg Intravenous Q6H  . insulin aspart  0-15 Units Subcutaneous TID WC  . isosorbide mononitrate  60 mg Oral Daily  . methocarbamol (ROBAXIN) IV  500 mg Intravenous Q8H  . OxyCODONE  20 mg Oral Q12H  . [COMPLETED] polyethylene glycol powder  1 Container Oral Daily  . sodium chloride  10-40 mL Intracatheter Q12H  . Tamsulosin HCl  0.4 mg Oral Daily  . tiotropium  18 mcg Inhalation Daily  . [COMPLETED] warfarin  7.5 mg Oral ONCE-1800  . Warfarin - Pharmacist Dosing Inpatient   Does not apply q1800  . [DISCONTINUED] diltiazem  60 mg Oral Q6H  . [DISCONTINUED] enoxaparin (LOVENOX) injection  120 mg Subcutaneous Q12H  . [DISCONTINUED] furosemide  40 mg Intravenous Q12H    Assessment: 66 yom on Lovenox for new RUE DVT (10/15/11) near PICC insertion side.  Right sided PICC removed, placed new  PICC in left.  Pt is s/p tibia IM nail insertion 08/17/2012.  He was admit 10/14/2012 with urinary retention, acute resp failure, hypoxia.  Lovenox was given 1/21 - 1/23 for r/o VTE, d/c after negative doppler, but restarted 1/28.  INR remains subtherapeutic, but increasing as expected with warfarin initiation.  SCr stable/improved, CrCl ~ 61 ml/min (normalized)  CBC: Plt wnl, Hgb low/stable Patient will need a minimum of 5 days warfarin / Lovenox overlap and until INR > 2 for 24 hours.   Goal of Therapy:  INR 2-3 Anti-Xa level 0.6-1.2 units/ml 4hrs after LMWH dose given Monitor platelets by anticoagulation protocol: Yes   Plan:   Continue Lovenox 120mg  SQ q12h  Warfarin 7.5mg  PO tonight x1  Daily INR  CBC q72 hours while on Lovenox (MD already ordered daily CBC  Warfarin education prior to pt discharge   Lynann Beaver PharmD, BCPS Pager [phone] 10/16/2012 8:24 AM

## 2012-10-16 NOTE — Progress Notes (Signed)
Mr. Rossin declines tap water enema at present due to leg pain.  He is still drinking Miralax from earlier dose.  His wife states that he had multiple loose BM's this afternoon and states that they were large in volume.  Berton Bon D

## 2012-10-17 DIAGNOSIS — R601 Generalized edema: Secondary | ICD-10-CM | POA: Insufficient documentation

## 2012-10-17 DIAGNOSIS — I2781 Cor pulmonale (chronic): Secondary | ICD-10-CM | POA: Diagnosis present

## 2012-10-17 LAB — PROTIME-INR: INR: 1.71 — ABNORMAL HIGH (ref 0.00–1.49)

## 2012-10-17 LAB — BASIC METABOLIC PANEL
Calcium: 8.4 mg/dL (ref 8.4–10.5)
Creatinine, Ser: 1.2 mg/dL (ref 0.50–1.35)
GFR calc Af Amer: 71 mL/min — ABNORMAL LOW (ref >90)

## 2012-10-17 LAB — GLUCOSE, CAPILLARY
Glucose-Capillary: 103 mg/dL — ABNORMAL HIGH (ref 70–99)
Glucose-Capillary: 136 mg/dL — ABNORMAL HIGH (ref 70–99)

## 2012-10-17 MED ORDER — ENOXAPARIN SODIUM 120 MG/0.8ML ~~LOC~~ SOLN
115.0000 mg | Freq: Two times a day (BID) | SUBCUTANEOUS | Status: DC
  Administered 2012-10-17 – 2012-10-22 (×12): 115 mg via SUBCUTANEOUS
  Filled 2012-10-17 (×14): qty 0.8

## 2012-10-17 NOTE — Progress Notes (Signed)
10/17/12 1248  PT Visit Information  Last PT Received On 10/17/12  Reason Eval/Treat Not Completed Fatigue/lethargy limiting ability to participate; unable to arouse pt to participate in therapy; pt significant other states he just had morphine; She also states he was up to [employer] today. Will attempt again at later date.

## 2012-10-17 NOTE — Progress Notes (Signed)
ANTICOAGULATION CONSULT NOTE - Follow Up Consult  Pharmacy Consult for Lovenox Indication: new RUE DVT  No Known Allergies  Patient Measurements: Height: 6\' 1"  (185.4 cm) Weight: 254 lb 3.1 oz (115.3 kg) IBW/kg (Calculated) : 79.9   Vital Signs: Temp: 98.2 F (36.8 C) (01/31 0522) Temp src: Oral (01/31 0522) BP: 149/88 mmHg (01/31 0522) Pulse Rate: 112  (01/31 0522)  Labs:  Basename 10/17/12 0440 10/16/12 0447 10/15/12 0610  HGB -- 9.0* 8.8*  HCT -- 30.4* 30.2*  PLT -- 248 219  APTT -- -- --  LABPROT 19.5* 16.4* 14.8  INR 1.71* 1.35 1.18  HEPARINUNFRC -- -- --  CREATININE 1.20 1.21 1.18  CKTOTAL -- -- --  CKMB -- -- --  TROPONINI -- -- --    Estimated Creatinine Clearance: 79.5 ml/min (by C-G formula based on Cr of 1.2).   Medications:  Scheduled:     . antiseptic oral rinse  15 mL Mouth Rinse BID  . bisacodyl  10 mg Rectal Daily  . diltiazem  240 mg Oral Daily  . docusate sodium  100 mg Oral BID  . enoxaparin (LOVENOX) injection  120 mg Subcutaneous Q12H  . furosemide  80 mg Intravenous Q12H  . gabapentin  100 mg Oral TID  . hydrALAZINE  10 mg Intravenous Q6H  . insulin aspart  0-15 Units Subcutaneous TID WC  . isosorbide mononitrate  60 mg Oral Daily  . methocarbamol (ROBAXIN) IV  500 mg Intravenous Q8H  . OxyCODONE  20 mg Oral Q12H  . sodium chloride  10-40 mL Intracatheter Q12H  . spironolactone  25 mg Oral BID  . Tamsulosin HCl  0.4 mg Oral Daily  . tiotropium  18 mcg Inhalation Daily  . [COMPLETED] warfarin  7.5 mg Oral ONCE-1800  . Warfarin - Pharmacist Dosing Inpatient   Does not apply q1800  . [DISCONTINUED] furosemide  40 mg Intravenous Q12H    Assessment: 66 yom on Lovenox for new RUE DVT (10/15/11) near PICC insertion side.  Right sided PICC removed, placed new PICC in left.  Pt is s/p tibia IM nail insertion 08/17/2012.  He was admit 10/03/2012 with urinary retention, acute resp failure, hypoxia.  Lovenox was given 1/21 - 1/23 for r/o VTE, d/c  after negative doppler, but restarted 1/28.  INR remains subtherapeutic, but now responding after warfarin x3 doses.  SCr stable/improved, CrCl ~ 61 ml/min (normalized)  CBC: Plt wnl, Hgb low/stable last on 1/30 Day #4 warfarin, but warfarin orders d/c.  Plan is to discharge on Lovenox alone.  Goal of Therapy:  Anti-Xa level 0.6-1.2 units/ml 4hrs after LMWH dose given Monitor platelets by anticoagulation protocol: Yes   Plan:   Continue Lovenox - will decrease to 115mg  SQ q12h  Weight is decreasing daily with diuresis.  Pharmacy will follow and adjust Lovenox dose as needed.  SCr and CBC at least q72h while on Lovenox.    Warfarin consult d/c per MD request.   Lynann Beaver PharmD, BCPS Pager [phone] 10/17/2012 8:37 AM

## 2012-10-17 NOTE — Consult Note (Signed)
PULMONARY  / CRITICAL CARE MEDICINE  Name: Terry [name] MRN: [id number] DOB: December 15, 1945    ADMISSION DATE:  10-23-2012 CONSULTATION DATE:  1/31  REFERRING MD : Dhungel  CHIEF COMPLAINT:  Hypoxia   BRIEF PATIENT DESCRIPTION:  67 year old male w/ chronic foley cath d/t urinary retention, HTN, DM, Obesity, and report of COPD, who initially presented on 1/21 for low UOP after routine cath change. On eval found to have diffuse anasarca and hypoxia which lead to admission for further eval and therapy. To date work up: V/Q scan: low prob for PE, LE dopplers: neg, but did have RUE DVT from PICC line. Treatment to date has included: IV diuresis, maintenance of spiriva, anticoagulation for DVT and supplemental oxygen. PCCM was asked to see in consult on 1/31 for persistent hypoxia.   Of note we evaluated in consult early December 2013, at that time we felt he needed PFTs, PSG and sent him home w/ spiriva, oxygen and recs to see [place]. Unfortunately he went without oxygen and got lost to follow-up.    SIGNIFICANT EVENTS / STUDIES:  12/2 echo: mod LVH, EF 65-70%, gd 1 diastolic dysfxn, RA mildly dilated, PA peak pressure: 73 mmhg, Cor pulmonale 1/22 V/Q low prob 1/28: RU doppler: positive for DVT  1/30 LE dopplers: negative   LINES / TUBES: picc removed 1/24   CULTURES:   ANTIBIOTICS:   PAST MEDICAL HISTORY :  Past Medical History  Diagnosis Date  . Diabetes mellitus without complication   . Peripheral vascular disease   . Hypertensive heart disease   . Obesity (BMI 30-39.9)   . Lumbar disc disease   . Anginal pain   . Hypertension    Past Surgical History  Procedure Date  . Back surgery     numerous spine surgeries, 1966-2011  . Hip replacement     bilateral, each replaced twice  . Tonsillectomy   . Tibia im nail insertion 08/17/2012    Procedure: INTRAMEDULLARY (IM) NAIL TIBIAL;  Surgeon: Kerrin Champagne, MD;  Location: [facility];  Service: Orthopedics;  Laterality: Right;  . Joint  replacement Bilateral hip   Prior to Admission medications   Medication Sig Start Date End Date Taking? Authorizing Provider  insulin aspart (NOVOLOG) 100 UNIT/ML injection Inject 0-15 Units into the skin 3 (three) times daily with meals. CBG < 70: Drink juice; CBG 70 - 120: 0 units: CBG 121 - 150: 2 units; CBG 151 - 200: 3 units; CBG 201 - 250: 5 units; CBG 251 - 300: 8 units;CBG 301 - 350: 11 units; CBG 351 - 400: 15 units; CBG > 400 : 15 units and notify MD 08/22/12  Yes Ripudeep Jenna Luo, MD  isosorbide mononitrate (IMDUR) 60 MG 24 hr tablet Take 60 mg by mouth daily.   Yes Historical Provider, MD  losartan-hydrochlorothiazide (HYZAAR) 100-25 MG per tablet Take 1 tablet by mouth daily.   Yes Historical Provider, MD  metformin (FORTAMET) 500 MG (OSM) 24 hr tablet Take 500 mg by mouth 3 (three) times daily.   Yes Historical Provider, MD  metoprolol (LOPRESSOR) 50 MG tablet Take 50 mg by mouth 2 (two) times daily.   Yes Historical Provider, MD  OxyCODONE (OXYCONTIN) 40 mg T12A Take 1 tablet (40 mg total) by mouth every 12 (twelve) hours. 08/22/12  Yes Kerrin Champagne, MD  polyethylene glycol [facility] / GLYCOLAX) packet Take 17 g by mouth daily as needed. For constipation   Yes Historical Provider, MD  silodosin (RAPAFLO) 8  MG CAPS capsule Take 8 mg by mouth daily.   Yes Historical Provider, MD  tiotropium (SPIRIVA) 18 MCG inhalation capsule Place 1 capsule (18 mcg total) into inhaler and inhale daily. 08/22/12  Yes Ripudeep Jenna Luo, MD  albuterol (PROVENTIL) (5 MG/ML) 0.5% nebulizer solution Take 0.5 mLs (2.5 mg total) by nebulization 2 (two) times daily. And q4hours PRN for wheezing and SOB 08/22/12   Ripudeep Jenna Luo, MD   No Known Allergies  FAMILY HISTORY:  History reviewed. No pertinent family history. SOCIAL HISTORY:  reports that he quit smoking about 2 months ago. His smoking use included Cigarettes. He has a 50 pack-year smoking history. He has never used smokeless tobacco. He reports that he does  not drink alcohol or use illicit drugs.  REVIEW OF SYSTEMS:  Bolds positive  Constitutional: Negative for fever, chills, weight loss, +malaise/fatigue and diaphoresis.  HENT: Negative for hearing loss, ear pain, nosebleeds, congestion, sore throat, neck pain, tinnitus and ear discharge.   Eyes: Negative for blurred vision, double vision, photophobia, pain, discharge and redness.  Respiratory: Negative for cough, hemoptysis, sputum production, shortness of breath, wheezing and stridor.   Cardiovascular: Negative for chest pain, palpitations, orthopnea, claudication, leg swelling and PND.  Gastrointestinal: Negative for heartburn, nausea, vomiting, abdominal pain, diarrhea, constipation, blood in stool and melena.  Genitourinary: Negative for dysuria, urgency, frequency, hematuria and flank pain.  Musculoskeletal: Negative for myalgias, back pain, joint pain and falls.  Skin: Negative for itching and rash.  Neurological: Negative for dizziness, tingling, tremors, sensory change, speech change, focal weakness, seizures, loss of consciousness, weakness and headaches.  Endo/Heme/Allergies: Negative for environmental allergies and polydipsia. Does not bruise/bleed easily.   VITAL SIGNS: Temp:  [97.8 F (36.6 C)-98.2 F (36.8 C)] 98.2 F (36.8 C) (01/31 0522) Pulse Rate:  [110-125] 112  (01/31 0522) Resp:  [12-20] 12  (01/31 0522) BP: (130-153)/(77-89) 149/88 mmHg (01/31 0522) SpO2:  [88 %-100 %] 100 % (01/31 0522) FiO2 (%):  [40 %] 40 % (01/30 1335) Weight:  [115.3 kg (254 lb 3.1 oz)] 115.3 kg (254 lb 3.1 oz) (01/31 0522) 5 liters  PHYSICAL EXAMINATION: General:  Chronically ill appearing AAM, no distress.  Neuro:  Awake, oriented, no focal def HEENT:  [place], no JVD Cardiovascular:  rrr Lungs:  Crackles in bases  Abdomen:  Firm, distended  Musculoskeletal:  RLE cast  Skin:  Taunt, diffuse anasarca    Lab 10/17/12 0440 10/16/12 0447 10/15/12 0610  NA 134* 135 134*  K 3.6 4.1 4.0  CL  98 98 98  CO2 30 28 27   BUN 21 24* 24*  CREATININE 1.20 1.21 1.18  GLUCOSE 100* 111* 106*    Lab 10/16/12 0447 10/15/12 0610 10/14/12 0522  HGB 9.0* 8.8* 9.2*  HCT 30.4* 30.2* 32.2*  WBC 6.3 6.8 8.2  PLT 248 219 217   No results found.  ASSESSMENT / PLAN:  1) COPD: stopped smoking Nov 2013, he is not having acute exacerbation  2) secondary PAH (severe & due to #1) w/ evidence of Cor Pulmonale. CT negative for PE 08/18/12 and on V/Q scan this admit 3) decompensated diastolic heart failure and R heart failure 4) pulmonary edema in setting of decompensated diastolic dysfxn  5) probable sleep apnea  6) acute on chronic respiratory failure: due to # 1,2, and prob #3.  7) anemia  8) DM 9) RUE DVT Plan Home on oxygen, walking oximetry to decide on how much  Cont spiriva Agree, needs diuresis F/u with [place]  for both PFTs and PSG F/u Dr Shelle Iron 2/18 at 11am   Pulmonary and Critical Care Medicine [facility] Pager: [phone]  10/17/2012, 9:18 AM  Levy Pupa, MD, PhD 10/17/2012, 2:33 PM [facility] [phone] or if no answer [phone]

## 2012-10-17 NOTE — Progress Notes (Signed)
Nutrition Brief Note  Body mass index is 33.54 kg/(m^2). Patient meets criteria for Obesity Grade 1 based on current BMI.   Current diet order is Carb-modified, patient is consuming approximately 100% of meals at this time. Labs and medications reviewed.   Pt did not know where his Carbohydrate Counting handout was, so I made a second copy for him.  His wife was in the room and informed me that she was very familiar with the importance of counting carbohydrates and she was able to tell me the reasons.  Pt was able to name foods that contained carbohydrates, but was in a lot of pain.  Consult RD if there are any other nutritional needs.  Ebbie Latus RD, LDN

## 2012-10-17 NOTE — Progress Notes (Signed)
Chart reviewed. I just saw the patient less than 24 hours ago and made recommendations. He seems to be diuresis in. However recommend aggressive diuretic regimen while following renal function. We will continue to follow over the weekend and advise as needed.

## 2012-10-17 NOTE — Progress Notes (Signed)
Pt currently refuses to wear CPAP, RT will assist as needed.

## 2012-10-17 NOTE — Progress Notes (Signed)
[facility] PROGRESS NOTE  BLAYDEN CONWELL ZOX:[id number] DOB: [birth year] DOA: 10/11/2012 PCP: No primary provider on file.   Brief narrative:  67 y/o obese Male with hx of HTN, DM, PVD, diastolic dysfunction, COPD with cor pulmonale, urinary retention on foley , chronic low back pain,recent right tibial fracture repair presented to ED with no urine output for 24 hrs and found to be hypoxic.  He was admitted for evaluation of hypoxia. VQ scan showed low probability for pulmonary embolism. Lower extremity doppler study negative for DVT. He was started on Lasix in ED for mild interstitial edema based on chest x-ray. In addition, creatinine was 1.65 on this admission. Lasix was temporarily discontinued due to development of hypotension and worsening kidney function.  Patient continued to experience abdominal pain and distention. Abdominal x-ray done on 01/24 showed possible constipation. Repeat abdominal x-ray showed possible colonic ileus and CT abdomen/pelvis done 01/26 shows concern for the same.   Assessment/Plan:  Abdominal distention, pain  Secondary to colonic ileus  increased bowel regimen to include Colace, MiraLAX and bisacodyl. Patient was on quite an extensive pain medication regimen which has been cut down to include morphine 1-2 mg every 6 hours as needed IV for pain, oxycodone 20 mg every 12 hours scheduled by mouth and tramadol 50 mg every 6 hours as needed. Patient currently on enemas TID as per GI recommendations.  Has been having good bowel movements daily.  Diastolic CHF, Acute on chronic  With anasarca Pro BNP on admission 5080 . A 2-D echo was done in December 2013 with normal ejection fraction and grade 1 diastolic dysfunction. However has cor pulmonale with severe PAH.  Lasix discontinued with worsening renal function. Given 40 mg IV lasix on 1/28. He clinically appears volume overload with SOB and leg edema. Started him on IV Lasix 40 mg twice a day. Increase it further to  80 mg twice a day as he is still short of breath. Diuresing well now but has anasarca. Was supposed to be on po lasix as outpatient. There is a strong possibility of medication non compliance.   Active Problems:  Right upper extremity DVT  In proximal right axillary vein  Dr Elisabeth Pigeon spoke with PCCM on call and recommendation was to take PICC line out and continue anticoagulation for next 3 months . Left UE PICC placed.  On lovenox per pharmacy.  Acute respiratory failure with hypoxia  Likely secondary to COPD exacerbation / acute diastolic dysfunction VQ scan showed low probability for PE; lower extremity doppler study negative for DVT.  Requiring facemask with almost 8-10 L O2. Now transitioned to 5L.   continue with scheduled nebs. IV lasix started. Monitor I/O. Recent echo with diastolic dysfn and cor pulmonale. Patient follows with Dr. Donnie Aho as outpatient. Will ask for evaluation  -ABG showing hypoxemia.  -appreciate pulmonary recs. Plan for outpt PFTs  Hypertensive heart disease  Added Cardizem 60 mg Q6 hours for sinus tacycardia. Switch to long acting Cardizem  Continue Imdur 60 mg daily and add hydralazine 10 mg every 6 hours IV.  COPD (chronic obstructive pulmonary disease)   Continue nebs . Titrated to 5L [place] from facemask    Urinary retention  Secondary to chronic urethral stricture Foley in place and has good urine output   UTI (lower urinary tract infection)  On rocephin prior to urine cx obtained. Completed 7 day course.   Fracture of tibia, distal, right, closed  Stable . Continue pain meds and encouraged PT.   Bilateral  leg pain  Seems to be chronic and now has painful LLE blisters. Continue dressing . Continue when necessary morphine and oxycodone Will add Neurontin for pain control.   Diabetes mellitus type 2 with complications  A1c 7.3  Continue sliding scale insulin  Appreciate diabetic coordinator recommendations   Anemia of chronic disease  No signs of  active bleed  Hemoglobin stable, range 8.9 - 9.2   Acute kidney injury  Secondary to UTI, urinary retention and Lasix  Resolved. Resumed Lasix   Obesity (BMI 30-39.9)  Nutrition consulted   Code Status: Full code  Family Communication: none at bedside Disposition Plan: Home health PT versus SNF once symptoms improved    Consultants:  [facility] Procedures:  PICC line. Antibiotics:  Completed 7days of rocephin  HPI/Subjective: Informs his breathing to be slightly better today. Diuresing well.   Objective: Filed Vitals:   10/17/12 0304 10/17/12 0522 10/17/12 0957 10/17/12 1000  BP: 153/89 149/88  155/88  Pulse: 110 112    Temp: 98.1 F (36.7 C) 98.2 F (36.8 C)    TempSrc: Oral Oral    Resp:  12    Height:      Weight:  115.3 kg (254 lb 3.1 oz)    SpO2: 91% 100% 98%     Intake/Output Summary (Last 24 hours) at 10/17/12 1258 Last data filed at 10/17/12 1021  Gross per 24 hour  Intake 1595.67 ml  Output   1875 ml  Net -279.33 ml   Filed Weights   10/15/12 0450 10/16/12 0451 10/17/12 0522  Weight: 118.6 kg (261 lb 7.5 oz) 116.7 kg (257 lb 4.4 oz) 115.3 kg (254 lb 3.1 oz)    Exam: General: Elderly obese male lying in bed , appears less restless today Heent: no pallor, moist oral mucosa, JVD not appreciated  Cardiovascular: NS 1&S2, no murmurs, rub or gallop  Respiratory: equal air entry b/l. No crackles or rhonchi heard  Abdomen: soft, NT, distended. BS+, pitting edema present EXT: Warm, 1 + pitting edema b/l, blisters over left tibia. tender b/l LE.  Anasarca + CNS: AAOx 3, non focal  Data Reviewed: Basic Metabolic Panel:  Lab 10/17/12 4098 10/16/12 0447 10/15/12 0610 10/14/12 0522 10/13/12 0421  NA 134* 135 134* 135 137  K 3.6 4.1 4.0 4.1 4.1  CL 98 98 98 100 100  CO2 30 28 27 27 28   GLUCOSE 100* 111* 106* 113* 108*  BUN 21 24* 24* 25* 28*  CREATININE 1.20 1.21 1.18 1.25 1.26  CALCIUM 8.4 8.7 8.7 8.7 8.7  MG -- -- -- -- --  PHOS -- -- -- -- --    Liver Function Tests: No results found for this basename: AST:5,ALT:5,ALKPHOS:5,BILITOT:5,PROT:5,ALBUMIN:5 in the last 168 hours No results found for this basename: LIPASE:5,AMYLASE:5 in the last 168 hours No results found for this basename: AMMONIA:5 in the last 168 hours CBC:  Lab 10/16/12 0447 10/15/12 0610 10/14/12 0522 10/13/12 0421 10/12/12 0430 10/11/12 0425  WBC 6.3 6.8 8.2 6.6 7.6 --  NEUTROABS -- -- -- -- -- 5.7  HGB 9.0* 8.8* 9.2* 9.0* 8.9* --  HCT 30.4* 30.2* 32.2* 30.5* 30.0* --  MCV 80.9 81.8 83.0 82.7 81.7 --  PLT 248 219 217 205 177 --   Cardiac Enzymes: No results found for this basename: CKTOTAL:5,CKMB:5,CKMBINDEX:5,TROPONINI:5 in the last 168 hours BNP (last 3 results)  Basename 10/13/12 1403 09/26/2012 2125 08/22/12 0456  PROBNP 2380.0* 5080.0* 5872.0*   CBG:  Lab 10/17/12 0739 10/16/12 1712 10/16/12 1157  10/16/12 0815 10/15/12 2039  GLUCAP 103* 127* 121* 112* 117*    Recent Results (from the past 240 hour(s))  MRSA PCR SCREENING     Status: Normal   Collection Time   10/08/12  4:21 AM      Component Value Range Status Comment   MRSA by PCR NEGATIVE  NEGATIVE Final      Studies: No results found.  Scheduled Meds:   . antiseptic oral rinse  15 mL Mouth Rinse BID  . bisacodyl  10 mg Rectal Daily  . diltiazem  240 mg Oral Daily  . docusate sodium  100 mg Oral BID  . enoxaparin (LOVENOX) injection  115 mg Subcutaneous Q12H  . furosemide  80 mg Intravenous Q12H  . gabapentin  100 mg Oral TID  . hydrALAZINE  10 mg Intravenous Q6H  . insulin aspart  0-15 Units Subcutaneous TID WC  . isosorbide mononitrate  60 mg Oral Daily  . methocarbamol (ROBAXIN) IV  500 mg Intravenous Q8H  . OxyCODONE  20 mg Oral Q12H  . sodium chloride  10-40 mL Intracatheter Q12H  . spironolactone  25 mg Oral BID  . Tamsulosin HCl  0.4 mg Oral Daily  . tiotropium  18 mcg Inhalation Daily    Time spent: 25 minutes    Chrsitopher Wik  [facility] Pager [phone]  If 8PM-8AM, please contact night-coverage at [email or web address], password [facility] 10/17/2012, 12:58 PM  LOS: 10 days

## 2012-10-17 NOTE — Progress Notes (Signed)
Pt. Refusing cpap. Does not wear at home

## 2012-10-18 DIAGNOSIS — I471 Supraventricular tachycardia: Secondary | ICD-10-CM | POA: Insufficient documentation

## 2012-10-18 LAB — CBC
HCT: 29 % — ABNORMAL LOW (ref 39.0–52.0)
MCH: 23.8 pg — ABNORMAL LOW (ref 26.0–34.0)
MCHC: 29.3 g/dL — ABNORMAL LOW (ref 30.0–36.0)
MCV: 81.2 fL (ref 78.0–100.0)
RDW: 24.8 % — ABNORMAL HIGH (ref 11.5–15.5)

## 2012-10-18 LAB — GLUCOSE, CAPILLARY: Glucose-Capillary: 103 mg/dL — ABNORMAL HIGH (ref 70–99)

## 2012-10-18 LAB — BASIC METABOLIC PANEL
BUN: 18 mg/dL (ref 6–23)
Chloride: 96 mEq/L (ref 96–112)
Creatinine, Ser: 1.12 mg/dL (ref 0.50–1.35)
GFR calc Af Amer: 77 mL/min — ABNORMAL LOW (ref >90)
Glucose, Bld: 105 mg/dL — ABNORMAL HIGH (ref 70–99)

## 2012-10-18 LAB — PROTIME-INR: Prothrombin Time: 21.8 seconds — ABNORMAL HIGH (ref 11.6–15.2)

## 2012-10-18 MED ORDER — DILTIAZEM HCL ER COATED BEADS 120 MG PO CP24
120.0000 mg | ORAL_CAPSULE | Freq: Every day | ORAL | Status: AC
  Filled 2012-10-18: qty 1

## 2012-10-18 MED ORDER — FUROSEMIDE 10 MG/ML IJ SOLN
80.0000 mg | Freq: Three times a day (TID) | INTRAMUSCULAR | Status: DC
  Administered 2012-10-18 – 2012-10-23 (×14): 80 mg via INTRAVENOUS
  Filled 2012-10-18 (×17): qty 8

## 2012-10-18 MED ORDER — GABAPENTIN 300 MG PO CAPS
300.0000 mg | ORAL_CAPSULE | Freq: Three times a day (TID) | ORAL | Status: DC
  Administered 2012-10-18 – 2012-10-22 (×15): 300 mg via ORAL
  Filled 2012-10-18 (×18): qty 1

## 2012-10-18 MED ORDER — DILTIAZEM HCL ER COATED BEADS 360 MG PO CP24
360.0000 mg | ORAL_CAPSULE | Freq: Every day | ORAL | Status: DC
  Administered 2012-10-19 – 2012-10-22 (×4): 360 mg via ORAL
  Filled 2012-10-18 (×6): qty 1

## 2012-10-18 NOTE — Progress Notes (Addendum)
[facility] PROGRESS NOTE  Terry [name] OZH:[id number] DOB: September 07, 1946 DOA: 2012/10/23 PCP: No primary provider on file.  Brief narrative:  67 y/o obese Male with hx of HTN, DM, PVD, diastolic dysfunction, COPD with cor pulmonale, urinary retention on foley , chronic low back pain,recent right tibial fracture repair presented to ED with no urine output for 24 hrs and found to be hypoxic.  He was admitted for evaluation of hypoxia. VQ scan showed low probability for pulmonary embolism. Lower extremity doppler study negative for DVT. He was started on Lasix in ED for mild interstitial edema based on chest x-ray. In addition, creatinine was 1.65 on this admission. Lasix was temporarily discontinued due to development of hypotension and worsening kidney function.  Patient continued to experience abdominal pain and distention. Abdominal x-ray done on 01/24 showed possible constipation. Repeat abdominal x-ray showed possible colonic ileus and CT abdomen/pelvis done 01/26 shows concern for the same.   Assessment/Plan:    Diastolic CHF, Acute on chronic With anasarca  Pro BNP on admission 5080 . A 2-D echo was done in December 2013 with normal ejection fraction and grade 1 diastolic dysfunction. However has cor pulmonale with severe PAH.  Lasix discontinued with worsening renal function but now   clinically appears volume overload with SOB and leg edema. Started him on IV Lasix 40 mg twice a day and increased dose to 80 mg bid on 1/31.  Diuresing well now but has anasarca. Was supposed to be on po lasix as outpatient. There is a strong possibility of medication non compliance.  -he is negative by about 2500 cc past 24 hrs. Continue current IV lasix dose. Monitor renal fn. -appreciate cardiology recs. Added aldactone.  Abdominal distention, pain  Secondary to colonic ileus  increased bowel regimen to include Colace, MiraLAX and bisacodyl. Patient was on quite an extensive pain medication regimen  which has been cut down to include morphine 1-2 mg every 6 hours as needed IV for pain, oxycodone 20 mg every 12 hours scheduled by mouth and tramadol 50 mg every 6 hours as needed. Received TID enemas and now has good bowel movements daily.   Active Problems:  Right upper extremity DVT  In proximal right axillary vein  Dr Elisabeth Pigeon spoke with PCCM on call and recommendation was to take PICC line out and continue anticoagulation for next 3 months . Left UE PICC placed.  On lovenox per pharmacy.   Acute respiratory failure with hypoxia  Likely secondary to COPD exacerbation / acute diastolic dysfunction VQ scan showed low probability for PE; lower extremity doppler study negative for DVT.  Requiring facemask with almost 8-10 L O2. Now transitioned to 5L. continue with scheduled nebs. IV lasix started. Monitor I/O. Recent echo with diastolic dysfn and cor pulmonale. Patient follows with Dr. Donnie Aho as outpatient. Seen by Dr Katrinka Blazing here.  -ABG showing hypoxemia.  -appreciate pulmonary recs. Plan for outpt PFTs  -02 sats now improved and currently maintained on 3L via [place]. Will need home o2 on d/c   Hypertensive heart disease  Added Cardizem 60 mg Q6 hours for sinus tacycardia. Switch to long acting Cardizem  Continue Imdur 60 mg daily and add hydralazine 10 mg every 6 hours IV.   COPD (chronic obstructive pulmonary disease)  Continue nebs . Titrated to to 3L via [place]  Urinary retention  Secondary to chronic urethral stricture Foley in place and has good urine output . Follow with urology as outpt   UTI (lower urinary tract infection)  Completed 7 day course with rocephin  Fracture of tibia, distal, right, closed  Stable . Continue pain meds and encouraged PT.   Bilateral leg pain  Seems to be chronic and now has painful LLE blisters. Continue dressing . Continue when necessary morphine and oxycodone  added Neurontin for pain control. Increased dose today   Diabetes mellitus type 2 with  complications  A1c 7.3  Continue sliding scale insulin  Appreciate diabetic coordinator recommendations   Anemia of chronic disease  No signs of active bleed  Hemoglobin stable, range 8.9 - 9.2   Acute kidney injury  Secondary to UTI, urinary retention and Lasix  Resolved. Resumed Lasix   Obesity (BMI 30-39.9)  Nutrition consulted    Consults: [facility] ( follows with Dr Donnie Aho as outpt) [facility]  Code Status: Full code  Family Communication: wife at bedside Disposition Plan: home with [facility].   Patient refusing SNF     HPI/Subjective: Informs poor sleep overnight. Still has b/l leg pain. SOB improving  Objective: Filed Vitals:   10/17/12 1529 10/17/12 2027 10/18/12 0343 10/18/12 0512  BP: 155/82 157/82 150/80 144/75  Pulse:  116  104  Temp:  98 F (36.7 C)  97.9 F (36.6 C)  TempSrc:  Oral  Oral  Resp:  18  20  Height:      Weight:    115.6 kg (254 lb 13.6 oz)  SpO2:  100%  100%    Intake/Output Summary (Last 24 hours) at 10/18/12 0928 Last data filed at 10/18/12 0516  Gross per 24 hour  Intake 1266.67 ml  Output   2450 ml  Net -1183.33 ml   Filed Weights   10/16/12 0451 10/17/12 0522 10/18/12 0512  Weight: 116.7 kg (257 lb 4.4 oz) 115.3 kg (254 lb 3.1 oz) 115.6 kg (254 lb 13.6 oz)    Exam: General: Elderly obese male lying in bed, minimal distress Heent: no pallor, moist oral mucosa, JVD not appreciated  Cardiovascular: NS 1&S2, no murmurs, rub or gallop  Respiratory: equal air entry b/l. No crackles or rhonchi heard  Abdomen: soft, NT, distended. BS+, pitting edema present  EXT: Warm, 1 + pitting edema b/l ( improving) , blisters over left tibia. tender b/l LE. Anasarca +  CNS: AAOx 3, non focal  Data Reviewed: Basic Metabolic Panel:  Lab 10/18/12 1610 10/17/12 0440 10/16/12 0447 10/15/12 0610 10/14/12 0522  NA 135 134* 135 134* 135  K 3.5 3.6 4.1 4.0 4.1  CL 96 98 98 98 100  CO2 31 30 28 27 27   GLUCOSE 105* 100* 111* 106*  113*  BUN 18 21 24* 24* 25*  CREATININE 1.12 1.20 1.21 1.18 1.25  CALCIUM 8.5 8.4 8.7 8.7 8.7  MG -- -- -- -- --  PHOS -- -- -- -- --   Liver Function Tests: No results found for this basename: AST:5,ALT:5,ALKPHOS:5,BILITOT:5,PROT:5,ALBUMIN:5 in the last 168 hours No results found for this basename: LIPASE:5,AMYLASE:5 in the last 168 hours No results found for this basename: AMMONIA:5 in the last 168 hours CBC:  Lab 10/18/12 0440 10/16/12 0447 10/15/12 0610 10/14/12 0522 10/13/12 0421  WBC 5.5 6.3 6.8 8.2 6.6  NEUTROABS -- -- -- -- --  HGB 8.5* 9.0* 8.8* 9.2* 9.0*  HCT 29.0* 30.4* 30.2* 32.2* 30.5*  MCV 81.2 80.9 81.8 83.0 82.7  PLT 216 248 219 217 205   Cardiac Enzymes: No results found for this basename: CKTOTAL:5,CKMB:5,CKMBINDEX:5,TROPONINI:5 in the last 168 hours BNP (last 3 results)  Basename  10/13/12 1403 10/02/2012 2125 08/22/12 0456  PROBNP 2380.0* 5080.0* 5872.0*   CBG:  Lab 10/18/12 0755 10/17/12 2149 10/17/12 1629 10/17/12 1127 10/17/12 0739  GLUCAP 122* 149* 136* 129* 103*    No results found for this or any previous visit (from the past 240 hour(s)).   Studies: No results found.  Scheduled Meds:   . antiseptic oral rinse  15 mL Mouth Rinse BID  . bisacodyl  10 mg Rectal Daily  . diltiazem  240 mg Oral Daily  . docusate sodium  100 mg Oral BID  . enoxaparin (LOVENOX) injection  115 mg Subcutaneous Q12H  . furosemide  80 mg Intravenous Q12H  . gabapentin  100 mg Oral TID  . hydrALAZINE  10 mg Intravenous Q6H  . insulin aspart  0-15 Units Subcutaneous TID WC  . isosorbide mononitrate  60 mg Oral Daily  . methocarbamol (ROBAXIN) IV  500 mg Intravenous Q8H  . OxyCODONE  20 mg Oral Q12H  . sodium chloride  10-40 mL Intracatheter Q12H  . spironolactone  25 mg Oral BID  . Tamsulosin HCl  0.4 mg Oral Daily  . tiotropium  18 mcg Inhalation Daily   Continuous Infusions:     Time spent: 25 minutes    Bindi Klomp  [facility] Pager  [phone]. If 8PM-8AM, please contact night-coverage at [email or web address], password [facility] 10/18/2012, 9:28 AM  LOS: 11 days

## 2012-10-18 NOTE — Progress Notes (Signed)
Patient refuses CPAP at this time.  

## 2012-10-18 NOTE — Progress Notes (Signed)
CHF teaching done.  Pt was asking why "this hit him so sudden". With talking to pt it was found that he eats chinese approx 2x a week, sodium intake was discussed w/pt and importance of daily weighing

## 2012-10-18 NOTE — Progress Notes (Addendum)
Patient Name: Terry [name] Date of Encounter: 10/18/2012    SUBJECTIVE: He complains of leg discomfort. He is also having dyspnea although slightly improved.  TELEMETRY:  Normal sinus rhythm on monitor: Filed Vitals:   10/17/12 2027 10/18/12 0343 10/18/12 0512 10/18/12 1057  BP: 157/82 150/80 144/75   Pulse: 116  104   Temp: 98 F (36.7 C)  97.9 F (36.6 C)   TempSrc: Oral  Oral   Resp: 18  20   Height:      Weight:   115.6 kg (254 lb 13.6 oz)   SpO2: 100%  100% 89%    Intake/Output Summary (Last 24 hours) at 10/18/12 1251 Last data filed at 10/18/12 0900  Gross per 24 hour  Intake 1026.67 ml  Output   2450 ml  Net -1423.33 ml    LABS: Basic Metabolic Panel:  Basename 10/18/12 0440 10/17/12 0440  NA 135 134*  K 3.5 3.6  CL 96 98  CO2 31 30  GLUCOSE 105* 100*  BUN 18 21  CREATININE 1.12 1.20  CALCIUM 8.5 8.4  MG -- --  PHOS -- --   CBC:  Basename 10/18/12 0440 10/16/12 0447  WBC 5.5 6.3  NEUTROABS -- --  HGB 8.5* 9.0*  HCT 29.0* 30.4*  MCV 81.2 80.9  PLT 216 248    Radiology/Studies:  No new data  Physical Exam: Blood pressure 144/75, pulse 104, temperature 97.9 F (36.6 C), temperature source Oral, resp. rate 20, height 6\' 1"  (1.854 m), weight 115.6 kg (254 lb 13.6 oz), SpO2 89.00%. Weight change: 0.3 kg (10.6 oz)   Markedly elevated neck veins.  Decreased breath sounds at the bases.   ASSESSMENT:  1. 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  2. Acute on chronic diastolic heart failure, with pulmonary congestion  3. Junctional tachycardia persists, and is aggravating diastolic heart failure due to loss of AV synchrony  Plan:  1. Will increase diltiazem to see if this breaks the tachycardia.  2. We'll intensify diuresis.  [name] 10/18/2012, 12:51 PM

## 2012-10-18 DEATH — deceased

## 2012-10-19 ENCOUNTER — Inpatient Hospital Stay (HOSPITAL_COMMUNITY): Payer: 59

## 2012-10-19 DIAGNOSIS — M109 Gout, unspecified: Secondary | ICD-10-CM | POA: Insufficient documentation

## 2012-10-19 DIAGNOSIS — I279 Pulmonary heart disease, unspecified: Secondary | ICD-10-CM

## 2012-10-19 LAB — BASIC METABOLIC PANEL
BUN: 18 mg/dL (ref 6–23)
Calcium: 8.4 mg/dL (ref 8.4–10.5)
Creatinine, Ser: 1.15 mg/dL (ref 0.50–1.35)
GFR calc Af Amer: 74 mL/min — ABNORMAL LOW (ref >90)
GFR calc non Af Amer: 64 mL/min — ABNORMAL LOW (ref >90)
Glucose, Bld: 123 mg/dL — ABNORMAL HIGH (ref 70–99)

## 2012-10-19 LAB — PROTIME-INR: Prothrombin Time: 16.6 seconds — ABNORMAL HIGH (ref 11.6–15.2)

## 2012-10-19 LAB — GLUCOSE, CAPILLARY
Glucose-Capillary: 152 mg/dL — ABNORMAL HIGH (ref 70–99)
Glucose-Capillary: 189 mg/dL — ABNORMAL HIGH (ref 70–99)

## 2012-10-19 MED ORDER — PREDNISONE 50 MG PO TABS
50.0000 mg | ORAL_TABLET | Freq: Every day | ORAL | Status: DC
  Administered 2012-10-19 – 2012-10-22 (×4): 50 mg via ORAL
  Filled 2012-10-19 (×6): qty 1

## 2012-10-19 NOTE — Progress Notes (Addendum)
Spoke with patient regarding cpap.  Pt refuses to wear it tonight, stating "I'm fine".  RN notified.

## 2012-10-19 NOTE — Progress Notes (Signed)
Patient converted to NSR at 1413 this afternoon and has remained in NSR since then. Dr. Gonzella Lex notified by text-page. Erskin Burnet RN

## 2012-10-19 NOTE — Progress Notes (Signed)
[facility] PROGRESS NOTE  Terry [name]:[id number] DOB: 11/14/1945 DOA: 09/22/2012 PCP: No primary provider on file.  Brief narrative:  67 y/o obese Male with hx of HTN, DM, PVD, diastolic dysfunction, COPD with cor pulmonale, urinary retention on foley , chronic low back pain,recent right tibial fracture repair presented to ED with no urine output for 24 hrs and found to be hypoxic.  He was admitted for evaluation of hypoxia. VQ scan showed low probability for pulmonary embolism. Lower extremity doppler study negative for DVT. He was started on Lasix in ED for mild interstitial edema based on chest x-ray. In addition, creatinine was 1.65 on this admission. Lasix was temporarily discontinued due to development of hypotension and worsening kidney function.  Patient continued to experience abdominal pain and distention. Abdominal x-ray done on 01/24 showed possible constipation. Repeat abdominal x-ray showed possible colonic ileus and CT abdomen/pelvis done 01/26 shows concern for the same.   Assessment/Plan:  Diastolic CHF, Acute on chronic With anasarca  Pro BNP on admission 5080 . A 2-D echo was done in December 2013 with normal ejection fraction and grade 1 diastolic dysfunction. However has cor pulmonale with severe PAH.  Lasix discontinued with worsening renal function but now clinically appears volume overload with SOB and leg edema. Started him on IV Lasix 40 mg twice a day and increased dose to 80 mg bid on 1/31. Diuresing well now but has anasarca. Was supposed to be on po lasix as outpatient. There is a strong possibility of medication non compliance.  -he is negative by about 3 L past 24 hrs and 5L since admission. Still needs significant diuresis  IV lasix dose increased further to 80 mg tid. Monitor renal fn. Added aldactone -appreciate cardiology recs.   Abdominal distention, pain  Secondary to colonic ileus  increased bowel regimen to include Colace, MiraLAX and bisacodyl.  Patient was on quite an extensive pain medication regimen which has been cut down to include morphine 1-2 mg every 6 hours as needed IV for pain, oxycodone 20 mg every 12 hours scheduled by mouth and tramadol 50 mg every 6 hours as needed. Received TID enemas and now has good bowel movements daily.   Active Problems:  Right upper extremity DVT  In proximal right axillary vein  Dr Elisabeth Pigeon spoke with PCCM on call and recommendation was to take PICC line out and continue anticoagulation for next 3 months . Left UE PICC placed.  On lovenox per pharmacy.   Acute respiratory failure with hypoxia  Likely secondary to COPD exacerbation / acute diastolic dysfunction VQ scan showed low probability for PE; lower extremity doppler study negative for DVT.  Requiring facemask with almost 8-10 L O2. Now transitioned to 5L. continue with scheduled nebs. IV lasix started. Monitor I/O. Recent echo with diastolic dysfn and cor pulmonale. Patient follows with Dr. Donnie Aho as outpatient. Seen by Dr Katrinka Blazing here.  -ABG showing hypoxemia.  -appreciate pulmonary recs. Plan for outpt PFTs  -02 sats now improved and currently maintained on 3L via [place]. Will need home o2 on d/c   Hypertensive heart disease  - Switch to long acting Cardizem and dose increased to 360 mg daily Continue Imdur 60 mg daily and add hydralazine 10 mg every 6 hours IV.   COPD (chronic obstructive pulmonary disease)  Continue nebs . Titrated to to 3L via [place]   Urinary retention  Secondary to chronic urethral stricture Foley in place and has good urine output . Follow with urology as outpt  UTI (lower urinary tract infection)  Completed 7 day course with rocephin   Fracture of tibia, distal, right, closed  Stable . Continue pain meds and encouraged PT.   Bilateral leg pain  Seems to be chronic and now has painful LLE blisters. Continue dressing . Continue when necessary morphine and oxycodone added Neurontin for pain control. Patient c/o  persistent rt knee and ankle pain. Had sx in the leg recently . Will get x ray of rt knee and ankle . Could have possible gout with hx of it and now on diuretic. Elevated uric acid noted. Will treat with a course of prednisone.   Diabetes mellitus type 2 with complications  A1c 7.3  Continue sliding scale insulin  Appreciate diabetic coordinator recommendations.  Anemia of chronic disease  No signs of active bleed  Hemoglobin stable, range 8.9 - 9.2   Acute kidney injury  Secondary to UTI, urinary retention and Lasix  Resolved.   Obesity (BMI 30-39.9)  Nutrition consulted   Consults:  [facility] ( follows with Dr Donnie Aho as outpt)  Pulmonary  [facility]   Code Status: Full code  Family Communication: wife at bedside  Disposition Plan: home with [facility]. Patient refusing SNF      HPI/Subjective: Continues to have leg pain ( mainly over right knee and ankle. )  Objective: Filed Vitals:   10/18/12 1350 10/18/12 2105 10/19/12 0530 10/19/12 0732  BP: 151/80 156/87 114/70   Pulse: 122 113 82   Temp: 98.3 F (36.8 C) 97.9 F (36.6 C)    TempSrc: Oral Oral    Resp: 14 16    Height:      Weight:    114.1 kg (251 lb 8.7 oz)  SpO2: 100% 99%      Intake/Output Summary (Last 24 hours) at 10/19/12 0915 Last data filed at 10/19/12 1610  Gross per 24 hour  Intake    824 ml  Output   3750 ml  Net  -2926 ml   Filed Weights   10/17/12 0522 10/18/12 0512 10/19/12 0732  Weight: 115.3 kg (254 lb 3.1 oz) 115.6 kg (254 lb 13.6 oz) 114.1 kg (251 lb 8.7 oz)    Exam: General: Elderly obese male lying in bed, minimal distress  Heent: no pallor, moist oral mucosa, JVD not appreciated  Cardiovascular: NS 1&S2, no murmurs, rub or gallop  Respiratory: equal air entry b/l. No crackles or rhonchi heard  Abdomen: soft, NT, distended. BS+, pitting edema present  EXT: Warm, 1 + pitting edema b/l ( improving) , blisters over left tibia. tender b/l LE with limited ROM of knee and ankle .  Anasarca + , scrotal edema CNS: AAOx 3, non focal    Data Reviewed: Basic Metabolic Panel:  Lab 10/19/12 9604 10/18/12 0440 10/17/12 0440 10/16/12 0447 10/15/12 0610  NA 135 135 134* 135 134*  K 3.7 3.5 3.6 4.1 4.0  CL 96 96 98 98 98  CO2 31 31 30 28 27   GLUCOSE 123* 105* 100* 111* 106*  BUN 18 18 21  24* 24*  CREATININE 1.15 1.12 1.20 1.21 1.18  CALCIUM 8.4 8.5 8.4 8.7 8.7  MG -- -- -- -- --  PHOS -- -- -- -- --   Liver Function Tests: No results found for this basename: AST:5,ALT:5,ALKPHOS:5,BILITOT:5,PROT:5,ALBUMIN:5 in the last 168 hours No results found for this basename: LIPASE:5,AMYLASE:5 in the last 168 hours No results found for this basename: AMMONIA:5 in the last 168 hours CBC:  Lab 10/18/12 0440 10/16/12 0447  10/15/12 0610 10/14/12 0522 10/13/12 0421  WBC 5.5 6.3 6.8 8.2 6.6  NEUTROABS -- -- -- -- --  HGB 8.5* 9.0* 8.8* 9.2* 9.0*  HCT 29.0* 30.4* 30.2* 32.2* 30.5*  MCV 81.2 80.9 81.8 83.0 82.7  PLT 216 248 219 217 205   Cardiac Enzymes: No results found for this basename: CKTOTAL:5,CKMB:5,CKMBINDEX:5,TROPONINI:5 in the last 168 hours BNP (last 3 results)  Basename 10/13/12 1403 10/02/2012 2125 08/22/12 0456  PROBNP 2380.0* 5080.0* 5872.0*   CBG:  Lab 10/19/12 0756 10/18/12 1648 10/18/12 1142 10/18/12 0755 10/17/12 2149  GLUCAP 118* 146* 103* 122* 149*    No results found for this or any previous visit (from the past 240 hour(s)).   Studies: No results found.  Scheduled Meds:   . antiseptic oral rinse  15 mL Mouth Rinse BID  . bisacodyl  10 mg Rectal Daily  . diltiazem  120 mg Oral Daily  . diltiazem  360 mg Oral Daily  . docusate sodium  100 mg Oral BID  . enoxaparin (LOVENOX) injection  115 mg Subcutaneous Q12H  . furosemide  80 mg Intravenous Q8H  . gabapentin  300 mg Oral TID  . hydrALAZINE  10 mg Intravenous Q6H  . insulin aspart  0-15 Units Subcutaneous TID WC  . isosorbide mononitrate  60 mg Oral Daily  . methocarbamol (ROBAXIN) IV  500  mg Intravenous Q8H  . OxyCODONE  20 mg Oral Q12H  . sodium chloride  10-40 mL Intracatheter Q12H  . spironolactone  25 mg Oral BID  . Tamsulosin HCl  0.4 mg Oral Daily  . tiotropium  18 mcg Inhalation Daily     Time spent: 35 minutes    Mariangela Heldt  [facility] Pager [phone]. If 8PM-8AM, please contact night-coverage at [email or web address], password [facility] 10/19/2012, 9:15 AM  LOS: 12 days

## 2012-10-19 NOTE — Progress Notes (Signed)
Patient Name: Terry [name] Date of Encounter: 10/19/2012    SUBJECTIVE: He states his breathing is much improved over the past 48 hours. He denies chest pain  TELEMETRY:  Normal sinus rhythm: Filed Vitals:   10/18/12 1350 10/18/12 2105 10/19/12 0530 10/19/12 0732  BP: 151/80 156/87 114/70   Pulse: 122 113 82   Temp: 98.3 F (36.8 C) 97.9 F (36.6 C)    TempSrc: Oral Oral    Resp: 14 16    Height:      Weight:    114.1 kg (251 lb 8.7 oz)  SpO2: 100% 99%      Intake/Output Summary (Last 24 hours) at 10/19/12 0842 Last data filed at 10/19/12 0806  Gross per 24 hour  Intake   1064 ml  Output   3750 ml  Net  -2686 ml   I/O:  - 5100 cc since admission LABS: Basic Metabolic Panel:  Basename 10/19/12 0500 10/18/12 0440  NA 135 135  K 3.7 3.5  CL 96 96  CO2 31 31  GLUCOSE 123* 105*  BUN 18 18  CREATININE 1.15 1.12  CALCIUM 8.4 8.5  MG -- --  PHOS -- --   CBC:  Basename 10/18/12 0440  WBC 5.5  NEUTROABS --  HGB 8.5*  HCT 29.0*  MCV 81.2  PLT 216    Radiology/Studies:  No new data  Physical Exam: Blood pressure 114/70, pulse 82, temperature 97.9 F (36.6 C), temperature source Oral, resp. rate 16, height 6\' 1"  (1.854 m), weight 114.1 kg (251 lb 8.7 oz), SpO2 99.00%. Weight change:    Marked edema arms, thighs, abdomen, and lower extremities.  Cardiac exam is unremarkable  ASSESSMENT:  1. Anasarca due to right heart failure, diuresis and nicely  2. Acute on chronic diastolic heart failure improved with diuresis  3. COPD with secondary pulmonary hypertension, breathing is improved.  4. Stable renal function so far.  Plan:  1. Continue aggressive diuresis  2. Closely monitor renal and electrolyte status  Signed, Lesleigh Noe 10/19/2012, 8:42 AM

## 2012-10-20 LAB — BASIC METABOLIC PANEL
BUN: 20 mg/dL (ref 6–23)
Chloride: 90 mEq/L — ABNORMAL LOW (ref 96–112)
GFR calc Af Amer: 73 mL/min — ABNORMAL LOW (ref >90)
GFR calc non Af Amer: 63 mL/min — ABNORMAL LOW (ref >90)
Potassium: 3.8 mEq/L (ref 3.5–5.1)

## 2012-10-20 LAB — CBC
Hemoglobin: 8.7 g/dL — ABNORMAL LOW (ref 13.0–17.0)
MCH: 24.1 pg — ABNORMAL LOW (ref 26.0–34.0)
MCHC: 29.6 g/dL — ABNORMAL LOW (ref 30.0–36.0)
Platelets: 236 10*3/uL (ref 150–400)
RDW: 24.9 % — ABNORMAL HIGH (ref 11.5–15.5)

## 2012-10-20 LAB — PROTIME-INR
INR: 1.27 (ref 0.00–1.49)
Prothrombin Time: 15.6 seconds — ABNORMAL HIGH (ref 11.6–15.2)

## 2012-10-20 LAB — GLUCOSE, CAPILLARY
Glucose-Capillary: 160 mg/dL — ABNORMAL HIGH (ref 70–99)
Glucose-Capillary: 156 mg/dL — ABNORMAL HIGH (ref 70–99)

## 2012-10-20 LAB — MAGNESIUM: Magnesium: 1.8 mg/dL (ref 1.5–2.5)

## 2012-10-20 MED ORDER — HYDRALAZINE HCL 25 MG PO TABS
25.0000 mg | ORAL_TABLET | Freq: Three times a day (TID) | ORAL | Status: DC
  Administered 2012-10-20 (×2): 25 mg via ORAL
  Filled 2012-10-20 (×4): qty 1

## 2012-10-20 MED ORDER — HYDRALAZINE HCL 50 MG PO TABS
50.0000 mg | ORAL_TABLET | Freq: Three times a day (TID) | ORAL | Status: DC
  Administered 2012-10-20 – 2012-10-23 (×8): 50 mg via ORAL
  Filled 2012-10-20 (×11): qty 1

## 2012-10-20 MED ORDER — SPIRONOLACTONE 50 MG PO TABS
50.0000 mg | ORAL_TABLET | Freq: Two times a day (BID) | ORAL | Status: DC
  Administered 2012-10-21 – 2012-10-22 (×4): 50 mg via ORAL
  Filled 2012-10-20 (×7): qty 1

## 2012-10-20 MED ORDER — METHOCARBAMOL 500 MG PO TABS
500.0000 mg | ORAL_TABLET | Freq: Three times a day (TID) | ORAL | Status: DC
  Administered 2012-10-20 – 2012-10-22 (×9): 500 mg via ORAL
  Filled 2012-10-20 (×13): qty 1

## 2012-10-20 NOTE — Progress Notes (Signed)
[facility] PROGRESS NOTE  Terry [name] ZOX:[id number] DOB: 11/22/1945 DOA: 09/26/2012 PCP: No primary provider on file.  Brief narrative:  67 y/o obese Male with hx of HTN, DM, PVD, diastolic dysfunction, COPD with cor pulmonale, urinary retention on foley , chronic low back pain,recent right tibial fracture repair presented to ED with no urine output for 24 hrs and found to be hypoxic.  He was admitted for evaluation of hypoxia. VQ scan showed low probability for pulmonary embolism. Lower extremity doppler study negative for DVT. He was started on Lasix in ED for mild interstitial edema based on chest x-ray. In addition, creatinine was 1.65 on this admission. Lasix was temporarily discontinued due to development of hypotension and worsening kidney function.  Patient continued to experience abdominal pain and distention. Abdominal x-ray done on 01/24 showed possible constipation. Repeat abdominal x-ray showed possible colonic ileus and CT abdomen/pelvis done 01/26 showed concern for the same. Hospital stay prolonged with development of anasarca needing aggressive diuresis.  Assessment/Plan:  Diastolic CHF, Acute on chronic With anasarca  Pro BNP on admission 5080 . A 2-D echo was done in December 2013 with normal ejection fraction and grade 1 diastolic dysfunction. However has cor pulmonale with severe PAH.  Lasix discontinued with worsening renal function but then developed volume overload with SOB and leg edema.  -resumed IV lasix and up titrated to 80 mg tid. Renal fn stable. Diuresing well and lost 13 lbs in 7 days. He informs his normal wt to be less than 200 lbs and is currently 248 Lbs. Still has a long way to diurese. Added aldactone. -appreciate cardiology recs.   Abdominal distention, pain  Secondary to colonic ileus  -required enemas and aggressive bowel regimen. Reduced dose of  narcotics. Now improved.   was seen by [facility].  Active Problems:  Right upper extremity DVT  In  proximal right axillary vein  Dr Elisabeth Pigeon spoke with PCCM on call and recommendation was to take PICC line out and continue anticoagulation for next 3 months . Left UE PICC placed.  On lovenox per pharmacy.   Acute respiratory failure with hypoxia  -likely more in the setting of diastolic dysfn and core pulmonale 2D echo reviewed  -now titrated to [place] from face mask. Follows with Dr Donnie Aho and seen by [facility] cards while in the hospital. -seen by pulmonary. Plan for outpt follow up with PFTs  Hypertensive heart disease  - Switch to long acting Cardizem and dose increased to 360 mg daily  Continue Imdur 60 mg daily. Added scheduled hydralazine. Cont prn hydralazine  COPD (chronic obstructive pulmonary disease)  Continue nebs . Titrated to to 3L via [place]   Urinary retention  Secondary to chronic urethral stricture Foley in place and has good urine output . Follow with urology as outpt   UTI (lower urinary tract infection)  Completed 7 day course with rocephin   Fracture of tibia, distal, right, closed  Stable . Continue pain meds and encouraged PT.   Bilateral leg pain  Seems to be chronic and now has painful LLE blisters. Continue dressing . Continue when necessary morphine and oxycodone added Neurontin for pain control.  Patient c/o persistent rt knee and ankle pain. Had sx in the leg recently . x ray of rt knee and ankle shows no acute insult. . Could have possible gout with hx of it and now on diuretic. Elevated uric acid noted. treating with a course of prednisone.   Diabetes mellitus type 2 with complications  A1c 7.3  Continue sliding scale insulin  Appreciate diabetic coordinator recommendations.   Anemia of chronic disease  No signs of active bleed  Hemoglobin stable, range 8.9 - 9.2   Acute kidney injury  Secondary to UTI, urinary retention and Lasix  Resolved.   Obesity (BMI 30-39.9)  Nutrition consulted   Consults:  [facility] ( follows with Dr Donnie Aho as outpt)   Pulmonary  [facility]   Code Status: Full code   Family Communication: wife at bedside   Disposition Plan: home with [facility]. Patient refusing SNF      HPI/Subjective: Feels better in terms of his breathing and abdominal distension. Has ongoing rt leg pains but slightly better today.   Objective: Filed Vitals:   10/19/12 2130 10/20/12 0428 10/20/12 0740 10/20/12 0937  BP: 137/67 185/95 163/67   Pulse: 99 95 101   Temp: 98.2 F (36.8 C) 97.9 F (36.6 C)    TempSrc: Oral Oral    Resp: 20 18    Height:      Weight:  112.764 kg (248 lb 9.6 oz)    SpO2: 98% 97%  95%    Intake/Output Summary (Last 24 hours) at 10/20/12 1058 Last data filed at 10/20/12 1015  Gross per 24 hour  Intake 3606.17 ml  Output   3300 ml  Net 306.17 ml   Filed Weights   10/18/12 0512 10/19/12 0732 10/20/12 0428  Weight: 115.6 kg (254 lb 13.6 oz) 114.1 kg (251 lb 8.7 oz) 112.764 kg (248 lb 9.6 oz)    Exam:  General: Elderly obese male lying in bed, minimal distress  Heent: no pallor, moist oral mucosa, JVD not appreciated  Cardiovascular: NS 1&S2, no murmurs, rub or gallop  Respiratory: equal air entry b/l. No crackles or rhonchi heard  Abdomen: soft, NT, distended. BS+, pitting edema present  EXT: Warm, 1 + pitting edema b/l ( improving) , blisters over left tibia. tender b/l LE with limited ROM of knee and ankle . Anasarca + , scrotal edema  CNS: AAOx 3, non focal   Data Reviewed: Basic Metabolic Panel:  Lab 10/20/12 4098 10/19/12 0500 10/18/12 0440 10/17/12 0440 10/16/12 0447  NA 130* 135 135 134* 135  K 3.8 3.7 3.5 3.6 4.1  CL 90* 96 96 98 98  CO2 34* 31 31 30 28   GLUCOSE 145* 123* 105* 100* 111*  BUN 20 18 18 21  24*  CREATININE 1.17 1.15 1.12 1.20 1.21  CALCIUM 8.5 8.4 8.5 8.4 8.7  MG -- -- -- -- --  PHOS -- -- -- -- --   Liver Function Tests: No results found for this basename: AST:5,ALT:5,ALKPHOS:5,BILITOT:5,PROT:5,ALBUMIN:5 in the last 168 hours No results found for this  basename: LIPASE:5,AMYLASE:5 in the last 168 hours No results found for this basename: AMMONIA:5 in the last 168 hours CBC:  Lab 10/20/12 1035 10/18/12 0440 10/16/12 0447 10/15/12 0610 10/14/12 0522  WBC 8.7 5.5 6.3 6.8 8.2  NEUTROABS -- -- -- -- --  HGB 8.7* 8.5* 9.0* 8.8* 9.2*  HCT 29.4* 29.0* 30.4* 30.2* 32.2*  MCV 81.4 81.2 80.9 81.8 83.0  PLT 236 216 248 219 217   Cardiac Enzymes: No results found for this basename: CKTOTAL:5,CKMB:5,CKMBINDEX:5,TROPONINI:5 in the last 168 hours BNP (last 3 results)  Basename 10/13/12 1403 10/08/2012 2125 08/22/12 0456  PROBNP 2380.0* 5080.0* 5872.0*   CBG:  Lab 10/20/12 0802 10/19/12 1602 10/19/12 1136 10/19/12 0756 10/18/12 1648  GLUCAP 160* 189* 152* 118* 146*    No results found  for this or any previous visit (from the past 240 hour(s)).   Studies: Dg Knee 1-2 Views Right  10/19/2012  *RADIOLOGY REPORT*  Clinical Data: Right knee pain and swelling, hot to touch, history of diabetes  RIGHT KNEE - 1-2 VIEW  Comparison: 10/10/2012  Findings:  Osteopenia.  No fracture or dislocation.  Unchanged nutrient foramen within the medial aspect of the proximal tibial metaphysis. The superior aspect of the tibial intramedullary rod is normal without discrete evidence of hardware failure or loosening.  Minimal amount of chondrocalcinosis.  Joint spaces are preserved. Small suprapatellar joint effusion.  No discrete areas of osteolysis to suggest osteomyelitis.  Vascular calcifications.  Regional soft tissue otherwise normal.  IMPRESSION: 1.  Small subpatellar joint effusion.  Otherwise, no acute findings.  2.  Chondrocalcinosis suggestive of CPPD. 3.  Please refer to separate dictation from right ankle radiographs performed earlier same day.   Original Report Authenticated By: Tacey Ruiz, MD    Dg Ankle 2 Views Right  10/19/2012  *RADIOLOGY REPORT*  Clinical Data: Right ankle pain and swelling, history of diabetes, hot to touch  RIGHT ANKLE - 2 VIEW   Comparison: Right knee radiographs - earlier same day; right ankle radiographs - 08/16/2012; intraoperative radiographs of the right tibia and fibula - 08/17/2012  Findings:  This examination was interpreted in conjunction with right knee radiograph performed earlier same day.  Post intramedullary rod fixation of previously noted oblique/spiral fracture of the distal diaphysis of the tibia.  There is persistent displacement of the fracture fragments with minimal callus formation.  There is no lucency around the tibial rod fixation to suggest hardware failure.  No discrete areas of osteolysis to suggest osteomyelitis.  Osteopenia is suspected within the talus and hindfoot.  No definite fracture.  Vascular calcifications.  IMPRESSION: Post intramedullary rod fixation of distal tibial oblique/spiral fracture without evidence of hardware failure or loosening. Minimal callus formation about the persistently lucent fracture site.   Original Report Authenticated By: Tacey Ruiz, MD     Scheduled Meds:   . antiseptic oral rinse  15 mL Mouth Rinse BID  . bisacodyl  10 mg Rectal Daily  . diltiazem  360 mg Oral Daily  . docusate sodium  100 mg Oral BID  . enoxaparin (LOVENOX) injection  115 mg Subcutaneous Q12H  . furosemide  80 mg Intravenous Q8H  . gabapentin  300 mg Oral TID  . hydrALAZINE  10 mg Intravenous Q6H  . hydrALAZINE  25 mg Oral Q8H  . insulin aspart  0-15 Units Subcutaneous TID WC  . isosorbide mononitrate  60 mg Oral Daily  . methocarbamol  500 mg Oral TID  . OxyCODONE  20 mg Oral Q12H  . predniSONE  50 mg Oral Q breakfast  . sodium chloride  10-40 mL Intracatheter Q12H  . spironolactone  25 mg Oral BID  . Tamsulosin HCl  0.4 mg Oral Daily  . tiotropium  18 mcg Inhalation Daily   Continuous Infusions:     Time spent: 25 minutes    Naomi Castrogiovanni  [facility] Pager [phone] If 8PM-8AM, please contact night-coverage at [email or web address], password [facility] 10/20/2012, 10:58 AM   LOS: 13 days

## 2012-10-20 NOTE — Progress Notes (Signed)
ANTICOAGULATION CONSULT NOTE - Follow Up Consult  Pharmacy Consult for Lovenox Indication: RUE DVT  No Known Allergies  Patient Measurements: Height: 6\' 1"  (185.4 cm) Weight: 248 lb 9.6 oz (112.764 kg) IBW/kg (Calculated) : 79.9  Lovenox Dosing Weight: 112.764 kg  Vital Signs: Temp: 97.9 F (36.6 C) (02/03 0428) Temp src: Oral (02/03 0428) BP: 163/67 mmHg (02/03 0740) Pulse Rate: 101  (02/03 0740)  Labs:  Basename 10/20/12 1035 10/20/12 0345 10/19/12 0500 10/18/12 0440  HGB 8.7* -- -- 8.5*  HCT 29.4* -- -- 29.0*  PLT 236 -- -- 216  APTT -- -- -- --  LABPROT -- 15.6* 16.6* 21.8*  INR -- 1.27 1.38 1.99*  HEPARINUNFRC -- -- -- --  CREATININE -- 1.17 1.15 1.12  CKTOTAL -- -- -- --  CKMB -- -- -- --  TROPONINI -- -- -- --    Estimated Creatinine Clearance: 80.7 ml/min (by C-G formula based on Cr of 1.17).   Assessment: 66 yoM on lovenox for RUE DVT.  Lovenox full dose initially given 1/21- 1/23 for r/o VTE, then changed to prophylactic dose.   Restarted at full dose on 1/28 after new DVT occurred.  Pt received 3 days of Coumadin bridge, but therapy then changed to monotherapy lovenox which patient remains on today.  CBC: Hgb low, stable, plts ok.  No bleed noted.   Renal fxn stable, SCr 1.17, CrCl ~ 80 ml/min  Weight down slightly to 112.76kg - no dose adjustment necessary now.  Per pt, normal weight < 90kg.  Fluid overloaded d/t diastolic CHF, AoC with anasarca on IV lasix, aldactone added.    Goal of Therapy:  Anti-Xa level 0.6-1.2 units/ml 4hrs after LMWH dose given Monitor platelets by anticoagulation protocol: Yes   Plan:  1.  Continue Lovenox 115mg  SQ q 12 hours.   2.  F/u renal fxn, weight, CBC, clinical course  Haynes Hoehn, PharmD 10/20/2012 11:20 AM  Pager: [phone]

## 2012-10-20 NOTE — Progress Notes (Signed)
PT Cancellation Note  Patient Details Name: Terry [name] MRN: [id number] DOB: 25-Jul-1946   Cancelled Treatment:    Reason Eval/Treat Not Completed: Pain limiting ability to participate;Other (comment) (pt crying out in pain, requesting RN for pain meds)   Lawerence Dery,KATHrine E 10/20/2012, 2:04 PM Pager: [phone]

## 2012-10-20 NOTE — Progress Notes (Signed)
Subjective:  Breathing is better but still volume overloaded.    Objective:  Vital Signs in the last 24 hours: BP 145/61  Pulse 108  Temp 97.9 F (36.6 C) (Oral)  Resp 18  Ht 6\' 1"  (1.854 m)  Wt 112.764 kg (248 lb 9.6 oz)  BMI 32.80 kg/m2  SpO2 98%  Physical Exam: Pleasant BM in NAD Lungs: Faint wheezing Cardiac:  Regular rhythm, normal S1 and S2, no S3, 1/6 systolic murmur Abdomen:  Soft, nontender, no masses Extremities:  2+ edema present legs bandaged  Intake/Output from previous day: 02/02 0701 - 02/03 0700 In: 3504.2 [P.O.:816; I.V.:2499.2; IV Piggyback:189] Out: 3300 [Urine:3300] Weight Filed Weights   10/18/12 0512 10/19/12 0732 10/20/12 0428  Weight: 115.6 kg (254 lb 13.6 oz) 114.1 kg (251 lb 8.7 oz) 112.764 kg (248 lb 9.6 oz)    Lab Results: Basic Metabolic Panel:  Basename 10/20/12 0345 10/19/12 0500  NA 130* 135  K 3.8 3.7  CL 90* 96  CO2 34* 31  GLUCOSE 145* 123*  BUN 20 18  CREATININE 1.17 1.15    CBC:  Basename 10/20/12 1035 10/18/12 0440  WBC 8.7 5.5  NEUTROABS -- --  HGB 8.7* 8.5*  HCT 29.4* 29.0*  MCV 81.4 81.2  PLT 236 216    BNP    Component Value Date/Time   PROBNP 2380.0* 10/13/2012 1403    Telemetry: Normal sinus rhythm at present  Assessment/Plan:  1. Anasarca due to right heart failure, diuresing 2. Acute on chronic diastolic heart failure improved with diuresis  3. COPD with secondary pulmonary hypertension, breathing is improved. 4. Hypertensive heart disease  Rec:  He weighed 198 pounds in November and is grossly volume overloaded.  Will need continued diuresis as well as excelent BP control.  Darden Palmer  MD [facility]  10/20/2012, 6:19 PM

## 2012-10-20 NOTE — Progress Notes (Signed)
Pt refuses to wear cpap again tonight. Informed him to call if he changed his mind.  Jacqulynn Cadet RRT

## 2012-10-21 DIAGNOSIS — M79606 Pain in leg, unspecified: Secondary | ICD-10-CM | POA: Insufficient documentation

## 2012-10-21 DIAGNOSIS — I5033 Acute on chronic diastolic (congestive) heart failure: Secondary | ICD-10-CM

## 2012-10-21 DIAGNOSIS — M79609 Pain in unspecified limb: Secondary | ICD-10-CM

## 2012-10-21 LAB — CBC
HCT: 28 % — ABNORMAL LOW (ref 39.0–52.0)
HCT: 29 % — ABNORMAL LOW (ref 39.0–52.0)
MCH: 23.9 pg — ABNORMAL LOW (ref 26.0–34.0)
MCH: 24.7 pg — ABNORMAL LOW (ref 26.0–34.0)
MCV: 82.6 fL (ref 78.0–100.0)
MCV: 82.4 fL (ref 78.0–100.0)
Platelets: 264 10*3/uL (ref 150–400)
RBC: 3.52 MIL/uL — ABNORMAL LOW (ref 4.22–5.81)
RDW: 25.4 % — ABNORMAL HIGH (ref 11.5–15.5)
RDW: 25.3 % — ABNORMAL HIGH (ref 11.5–15.5)
WBC: 7.9 10*3/uL (ref 4.0–10.5)
WBC: 7.9 10*3/uL (ref 4.0–10.5)

## 2012-10-21 LAB — BASIC METABOLIC PANEL
BUN: 24 mg/dL — ABNORMAL HIGH (ref 6–23)
CO2: 34 mEq/L — ABNORMAL HIGH (ref 19–32)
Calcium: 8.6 mg/dL (ref 8.4–10.5)
Chloride: 89 mEq/L — ABNORMAL LOW (ref 96–112)
Creatinine, Ser: 1.35 mg/dL (ref 0.50–1.35)
Glucose, Bld: 171 mg/dL — ABNORMAL HIGH (ref 70–99)

## 2012-10-21 LAB — PROTIME-INR: INR: 1.07 (ref 0.00–1.49)

## 2012-10-21 NOTE — Progress Notes (Signed)
Subjective:  Breathing is better.  Weight was up 14 pounds and I asked them to reweigh that was better.  Good output still   Objective:  Vital Signs in the last 24 hours: BP 164/81  Pulse 96  Temp 98.6 F (37 C) (Oral)  Resp 20  Ht 6\' 1"  (1.854 m)  Wt 118.842 kg (262 lb)  BMI 34.57 kg/m2  SpO2 96%  Physical Exam: Pleasant BM in NAD Lungs: Faint wheezing Cardiac:  Regular rhythm, normal S1 and S2, no S3, 1/6 systolic murmur Abdomen:  Soft, nontender, no masses Extremities:  2+ edema present legs bandaged  Intake/Output from previous day: 02/03 0701 - 02/04 0700 In: 1188 [P.O.:720; I.V.:460; IV Piggyback:8] Out: 3300 [Urine:3300] Weight Filed Weights   10/19/12 0732 10/20/12 0428 10/21/12 0505  Weight: 114.1 kg (251 lb 8.7 oz) 112.764 kg (248 lb 9.6 oz) 118.842 kg (262 lb)    Lab Results: Basic Metabolic Panel:  Basename 10/21/12 0346 10/20/12 0345  NA 129* 130*  K 3.9 3.8  CL 89* 90*  CO2 34* 34*  GLUCOSE 171* 145*  BUN 24* 20  CREATININE 1.35 1.17    CBC:  Basename 10/21/12 0346 10/20/12 1035  WBC 7.9 8.7  NEUTROABS -- --  HGB 8.1* 8.7*  HCT 28.0* 29.4*  MCV 82.6 81.4  PLT 264 236    BNP    Component Value Date/Time   PROBNP 2380.0* 10/13/2012 1403    Telemetry: Normal sinus rhythm at present  Assessment/Plan:  1. Anasarca due to right heart failure, diuresing 2. Acute on chronic diastolic heart failure improved with diuresis  3. COPD with secondary pulmonary hypertension, breathing is improved. 4. Hypertensive heart disease  Rec:  He weighed 198 pounds in November and is grossly volume overloaded. Renal function is slightly worse and will need to watch carefully as diureses happens. I increased his aldactone and hydralazine yesterday.  Darden Palmer  MD [facility]  10/21/2012, 12:42 PM

## 2012-10-21 NOTE — Progress Notes (Signed)
Physical Therapy Treatment Patient Details Name: Terry [name] MRN: [id number] DOB: Aug 16, 1946 Today's Date: 10/21/2012 Time: 4098-1191 PT Time Calculation (min): 17 min  PT Assessment / Plan / Recommendation Comments on Treatment Session  Pt continues to have increased LE pain and very limited with mobility requiring +2 assist.  Pt only able to tolerate pivot to recliner.  Pt refusing SNF per chart however continue to recommend.    Follow Up Recommendations  Home health PT;SNF;Supervision/Assistance - 24 hour     Does the patient have the potential to tolerate intense rehabilitation     Barriers to Discharge        Equipment Recommendations  Hospital bed    Recommendations for Other Services    Frequency     Plan Discharge plan remains appropriate;Frequency remains appropriate    Precautions / Restrictions Precautions Precautions: Back;Fall Precaution Comments: multiple back surgeries, weeping blisters LLE Restrictions Other Position/Activity Restrictions: Pt with recent tib/fib fx in Dec and was told to put minimum weight on RLE   Pertinent Vitals/Pain Pt reporting pain however states better than yesterday.  Notified RN of request for more pain meds.    Mobility  Bed Mobility Bed Mobility: Supine to Sit Supine to Sit: 3: Mod assist;HOB elevated;With rails Details for Bed Mobility Assistance: assist for bil LEs off EOB, verbal cues for technique Transfers Transfers: Sit to Stand;Stand to Sit;Stand Pivot Transfers Sit to Stand: 1: +2 Total assist;From bed Sit to Stand: Patient Percentage: 60% Stand to Sit: 1: +2 Total assist;To chair/3-in-1 Stand to Sit: Patient Percentage: 60% Stand Pivot Transfers: 1: +2 Total assist Stand Pivot Transfers: Patient Percentage: 60% Details for Transfer Assistance: verbal cues for safe technique including hand placement, R LE sliding forward upon 1st attempt so therapist blocked pt's foot on 2nd attempt, pt unable to tolerate much WBing on R  LE and educated to use upper body strength through RW to assist, verbal and visual cues for pivot to recliner    Exercises     PT Diagnosis:    PT Problem List:   PT Treatment Interventions:     PT Goals Acute Rehab PT Goals PT Goal: Supine/Side to Sit - Progress: Progressing toward goal PT Goal: Sit to Stand - Progress: Progressing toward goal PT Goal: Stand to Sit - Progress: Progressing toward goal PT Transfer Goal: Bed to Chair/Chair to Bed - Progress: Progressing toward goal  Visit Information  Last PT Received On: 10/21/12 Assistance Needed: +2    Subjective Data  Subjective: I'll try.   Cognition  Cognition Overall Cognitive Status: Appears within functional limits for tasks assessed/performed Arousal/Alertness: Awake/alert Orientation Level: Appears intact for tasks assessed Behavior During Session: [facility] for tasks performed    Balance     End of Session PT - End of Session Equipment Utilized During Treatment: Gait belt;Oxygen (O2 ) Activity Tolerance: Patient limited by fatigue;Patient limited by pain Patient left: in chair;with call bell/phone within reach;with family/visitor present Nurse Communication: Mobility status;Need for lift equipment;Patient requests pain meds   GP     Pratham Cassatt,KATHrine E 10/21/2012, 10:57 AM Zenovia Jarred, PT, DPT 10/21/2012 Pager: [phone]

## 2012-10-21 NOTE — Progress Notes (Addendum)
[facility] PROGRESS NOTE  WOODFORD STREGE ZOX:[id number] DOB: 24-Dec-1945 DOA: 10/15/2012 PCP: No primary provider on file.  Brief narrative:  67 y/o obese Male with hx of HTN, DM, PVD, diastolic dysfunction, COPD with cor pulmonale, urinary retention on foley , chronic low back pain,recent right tibial fracture repair presented to ED with no urine output for 24 hrs and found to be hypoxic.  He was admitted for evaluation of hypoxia. VQ scan showed low probability for pulmonary embolism. Lower extremity doppler study negative for DVT. He was started on Lasix in ED for mild interstitial edema based on chest x-ray. In addition, creatinine was 1.65 on this admission. Lasix was temporarily discontinued due to development of hypotension and worsening kidney function.  Patient continued to experience abdominal pain and distention. Abdominal x-ray done on 01/24 showed possible constipation. Repeat abdominal x-ray showed possible colonic ileus and CT abdomen/pelvis done 01/26 showed concern for the same. Hospital stay prolonged with development of anasarca needing aggressive diuresis.   Assessment/Plan:  Diastolic CHF, Acute on chronic With anasarca  Pro BNP on admission 5080 . A 2-D echo was done in December 2013 with normal ejection fraction and grade 1 diastolic dysfunction. However has cor pulmonale with severe PAH.  Lasix discontinued with worsening renal function but then developed volume overload with SOB and leg edema.  -resumed IV lasix and up titrated to 80 mg tid. Renal fn slowly worsening but  . He informs his normal weight to be just under 200 lbs. and is  248 Lbs as per 2/3 ( weight not accurately charted today.) . Still has a long way to diurese. Added aldactone.  -appreciate cardiology recs.   Abdominal distention, pain  Secondary to colonic ileus  -required enemas and aggressive bowel regimen. Reduced dose of narcotics. Now improved.  was seen by [facility].   Active Problems:  Right  upper extremity DVT  In proximal right axillary vein  Dr Elisabeth Pigeon spoke with PCCM on call and recommendation was to take PICC line out and continue anticoagulation for next 3 months . Left UE PICC placed.  On lovenox per pharmacy.   Acute respiratory failure with hypoxia  -likely more in the setting of diastolic dysfn and core pulmonale  2D echo reviewed  -now titrated to Ryan from face mask. Follows with Dr Donnie Aho and seen by [facility] cards while in the hospital.  -seen by pulmonary. Plan for outpt follow up with PFTs   Hypertensive heart disease  - Switch to long acting Cardizem and dose increased to 360 mg daily  Continue Imdur 60 mg daily. Added scheduled hydralazine. Continue  prn hydralazine.  COPD (chronic obstructive pulmonary disease)  Continue nebs . Titrated to to 3L via [place]   Urinary retention  Secondary to chronic urethral stricture and foley placed on recent hospitalization for leg surgery.  has good urine output . Follow with urology as outpt   UTI (lower urinary tract infection)  Completed 7 day course with rocephin   Fracture of tibia, distal, right, closed  Stable . Continue pain meds and encouraged PT.   Bilateral leg pain  Seems to be chronic and now has painful LLE blisters. Continue dressing . Continue when necessary morphine and oxycodone added Neurontin for pain control.  Patient c/o persistent rt knee and ankle pain. Had sx in the leg recently . x ray of rt knee and ankle shows no acute insult. . Could have possible gout with hx of it and now on diuretic. Elevated uric  acid noted. treating with a course of prednisone.  The LLE pain and blisters seem new ( only 3 weeks per patient). 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 Unclear if this is new ischemia . He has feeble distal pulsation on doppler. i have asked [facility] for an evaluation.    Diabetes mellitus type 2 with complications  A1c 7.3  Continue sliding scale insulin  Appreciate diabetic coordinator recommendations.   Anemia of chronic disease  No signs of active bleed  Slight drop in H&H today. No need for transfusion. recheck in am   Acute kidney injury  Secondary to UTI, urinary retention and Lasix  currently Resolved. Monitor as patient back  on high dose lasix   Hyponatremia  likely int he setting of lasix. Monitor labs closely   Obesity (BMI 30-39.9)  Nutrition consulted   Consults:  [facility] / Dr Donnie [facility]ho [facility] for LLE pain and hx of PVD Pulmonary ( patient will follow as outpt for PFT) [facility]   Code Status: Full code  Family Communication: wife at bedside  Disposition Plan: home with [facility]. Patient refusing SNF . Will likely be in the hospital for next few days for ongoing aggressive diuresis.    HPI/Subjective: Still has b/l leg pain but not worse. Diuresing well. Has some junctional rhythm on tele. Diuresing well.   Objective: Filed Vitals:   10/20/12 2025 10/21/12 0505 10/21/12 0745 10/21/12 0817  BP: 151/85 168/85 164/81   Pulse: 96 94 96   Temp: 97.9 F (36.6 C) 98.3 F (36.8 C) 98.6 F (37 C)   TempSrc: Oral Oral Oral   Resp: 18 20    Height:      Weight:  118.842 kg (262 lb)    SpO2: 99% 100%  96%    Intake/Output Summary (Last 24 hours) at 10/21/12 1202 Last data filed at 10/21/12 1136  Gross per 24 hour  Intake    708 ml  Output   3800 ml  Net  -3092 ml   Filed Weights   10/19/12 0732 10/20/12 0428 10/21/12 0505  Weight: 114.1 kg (251 lb 8.7 oz) 112.764 kg (248 lb 9.6 oz) 118.842 kg (262 lb)    Exam:  General: Elderly obese male lying in bed, minimal distress  Heent: no pallor, moist oral mucosa, JVD not appreciated  Cardiovascular: NS 1&S2, no murmurs, rub or gallop  Respiratory: equal air entry b/l. No crackles or rhonchi heard  Abdomen: soft, NT, distended. BS+, pitting edema present  EXT: Warm, 1  + pitting edema b/l ( improving) , blisters over left tibia. tender b/l LE with limited ROM of knee and ankle . Superficial skin changes over left leg with poor distal tibial pulses on left. Anasarca + , scrotal edema , foley in place. CNS: AAOx 3, non focal   Data Reviewed: Basic Metabolic Panel:  Lab 10/21/12 2130 10/20/12 1700 10/20/12 0345 10/19/12 0500 10/18/12 0440 10/17/12 0440  NA 129* -- 130* 135 135 134*  K 3.9 -- 3.8 3.7 3.5 3.6  CL 89* -- 90* 96 96 98  CO2 34* -- 34* 31 31 30   GLUCOSE 171* -- 145* 123* 105* 100*  BUN 24* -- 20 18 18 21   CREATININE 1.35 -- 1.17 1.15 1.12 1.20  CALCIUM 8.6 -- 8.5 8.4 8.5 8.4  MG -- 1.8 -- -- -- --  PHOS -- -- -- -- -- --  Liver Function Tests: No results found for this basename: AST:5,ALT:5,ALKPHOS:5,BILITOT:5,PROT:5,ALBUMIN:5 in the last 168 hours No results found for this basename: LIPASE:5,AMYLASE:5 in the last 168 hours No results found for this basename: AMMONIA:5 in the last 168 hours CBC:  Lab 10/21/12 0346 10/20/12 1035 10/18/12 0440 10/16/12 0447 10/15/12 0610  WBC 7.9 8.7 5.5 6.3 6.8  NEUTROABS -- -- -- -- --  HGB 8.1* 8.7* 8.5* 9.0* 8.8*  HCT 28.0* 29.4* 29.0* 30.4* 30.2*  MCV 82.6 81.4 81.2 80.9 81.8  PLT 264 236 216 248 219   Cardiac Enzymes: No results found for this basename: CKTOTAL:5,CKMB:5,CKMBINDEX:5,TROPONINI:5 in the last 168 hours BNP (last 3 results)  Basename 10/13/12 1403 10-20-2012 2125 08/22/12 0456  PROBNP 2380.0* 5080.0* 5872.0*   CBG:  Lab 10/21/12 1140 10/21/12 0735 10/20/12 1654 10/20/12 1142 10/20/12 0802  GLUCAP 180* 134* 198* 156* 160*    No results found for this or any previous visit (from the past 240 hour(s)).   Studies: No results found.  Scheduled Meds:   . antiseptic oral rinse  15 mL Mouth Rinse BID  . bisacodyl  10 mg Rectal Daily  . diltiazem  360 mg Oral Daily  . docusate sodium  100 mg Oral BID  . enoxaparin (LOVENOX) injection  115 mg Subcutaneous Q12H  . furosemide  80  mg Intravenous Q8H  . gabapentin  300 mg Oral TID  . hydrALAZINE  50 mg Oral Q8H  . insulin aspart  0-15 Units Subcutaneous TID WC  . isosorbide mononitrate  60 mg Oral Daily  . methocarbamol  500 mg Oral TID  . OxyCODONE  20 mg Oral Q12H  . predniSONE  50 mg Oral Q breakfast  . sodium chloride  10-40 mL Intracatheter Q12H  . spironolactone  50 mg Oral BID  . Tamsulosin HCl  0.4 mg Oral Daily  . tiotropium  18 mcg Inhalation Daily     Time spent: 25 minutes    Harshal Sirmon  [facility] Pager [phone]. If 8PM-8AM, please contact night-coverage at [email or web address], password [facility] 10/21/2012, 12:02 PM  LOS: 14 days

## 2012-10-21 NOTE — Consult Note (Addendum)
Reason for Consult: Lt leg pain  Requesting Physician: Claybon Jabs  HPI: This is a 67 y.o. male with a past medical history significant for PVD as well as multiple other problems. He had been followed by Dr Tresa Endo in the past for HTN, and Dr Allyson Sabal for PVD. He has no history of CAD and has had a remote low risk Myoview. Dr Allyson Sabal placed a LCIA stent in 2009. At that time the pt also had an occluded Lt SFA. He has RAS of 60% by angiogram 2009. His last LE arterial dopplers were in 2010 and his ABIs were 0.82 on the Rt and 0.63 on the Lt. He had renal dopplers Feb 2012- 60-99% LRAS.           He has been hospitalized most of the last 2 months. He fell out of a chair 07/3012 and suffered a fracture of his Rt leg. This required surgery 08/17/12. His post op course was complicated by resp failure which was felt to be secondary to COPD, Pulm Htn, obesity,  and acute on chronic diastolic CHF. His PA pressures were in the 70s in Dec by echo.  During that admission he transferred his cardiac care to Dr Donnie Aho. He was readmitted 09/29/12 with urinary retention and acute renal injury. During this admission he has had junctional tachycardia, resp failure, ileus secondary to pain medications, UTI, and treatment of LE wounds. He has had swelling and pain in his legs in the past. He was seen by wound care this admission. Venous dopplers have been negative for DVT. Formal arterial dopplers have not been done (he has significant pain and edema) but bedside dorsalis pedis doppler was positive for a pulse per the RN today. He has no real history of claudication though as mentioned he has been hospitalized for the past two months. Dr Allyson Sabal is asked to see if his Lt leg pain and ulcers have an ischemic component.  PMHx:  Past Medical History  Diagnosis Date  . Diabetes mellitus without complication   . Peripheral vascular disease   . Hypertensive heart disease   . Obesity (BMI 30-39.9)   . Lumbar disc disease   . Anginal  pain   . Hypertension    Past Surgical History  Procedure Date  . Back surgery     numerous spine surgeries, 1966-2011  . Hip replacement     bilateral, each replaced twice  . Tonsillectomy   . Tibia im nail insertion 08/17/2012    Procedure: INTRAMEDULLARY (IM) NAIL TIBIAL;  Surgeon: Kerrin Champagne, MD;  Location: [facility];  Service: Orthopedics;  Laterality: Right;  . Joint replacement Bilateral hip    FAMHx: History reviewed. No pertinent family history.  SOCHx:  reports that he quit smoking about 2 months ago. His smoking use included Cigarettes. He has a 50 pack-year smoking history. He has never used smokeless tobacco. He reports that he does not drink alcohol or use illicit drugs.  ALLERGIES: No Known Allergies  ROS: Pertinent items are noted in HPI. He denies angina. His SOB has improved  HOME MEDICATIONS: Prescriptions prior to admission  Medication Sig Dispense Refill  . insulin aspart (NOVOLOG) 100 UNIT/ML injection Inject 0-15 Units into the skin 3 (three) times daily with meals. CBG < 70: Drink juice; CBG 70 - 120: 0 units: CBG 121 - 150: 2 units; CBG 151 - 200: 3 units; CBG 201 - 250: 5 units; CBG 251 - 300: 8 units;CBG 301 - 350: 11 units; CBG 351 -  400: 15 units; CBG > 400 : 15 units and notify MD  1 vial    . isosorbide mononitrate (IMDUR) 60 MG 24 hr tablet Take 60 mg by mouth daily.      Marland Kitchen losartan-hydrochlorothiazide (HYZAAR) 100-25 MG per tablet Take 1 tablet by mouth daily.      . metformin (FORTAMET) 500 MG (OSM) 24 hr tablet Take 500 mg by mouth 3 (three) times daily.      . metoprolol (LOPRESSOR) 50 MG tablet Take 50 mg by mouth 2 (two) times daily.      . OxyCODONE (OXYCONTIN) 40 mg T12A Take 1 tablet (40 mg total) by mouth every 12 (twelve) hours.  60 tablet  0  . polyethylene glycol (MIRALAX / GLYCOLAX) packet Take 17 g by mouth daily as needed. For constipation      . silodosin (RAPAFLO) 8 MG CAPS capsule Take 8 mg by mouth daily.      Marland Kitchen tiotropium  (SPIRIVA) 18 MCG inhalation capsule Place 1 capsule (18 mcg total) into inhaler and inhale daily.  30 capsule    . albuterol (PROVENTIL) (5 MG/ML) 0.5% nebulizer solution Take 0.5 mLs (2.5 mg total) by nebulization 2 (two) times daily. And q4hours PRN for wheezing and SOB  20 mL      HOSPITAL MEDICATIONS: I have reviewed the patient's current medications.  VITALS: Blood pressure 164/81, pulse 96, temperature 98.6 F (37 C), temperature source Oral, resp. rate 20, height 6\' 1"  (1.854 m), weight 118.842 kg (262 lb), SpO2 96.00%.  PHYSICAL EXAM: General appearance: alert, cooperative, no distress and morbidly obese Lungs: decreased breath sounds Rt 1/3 Heart: regular rate and rhythm Abdomen: obese Extremities: both LE have wound wraps. He is in heel boots. Lt Le is warm, 2+ pittening edema to the knees. Very tender to palpation Pulses: dimminished Skin: cool and dry Neurologic: Grossly normal  LABS: Results for orders placed during the hospital encounter of 09/20/2012 (from the past 48 hour(s))  GLUCOSE, CAPILLARY     Status: Abnormal   Collection Time   10/19/12  4:02 PM      Component Value Range Comment   Glucose-Capillary 189 (*) 70 - 99 mg/dL   PROTIME-INR     Status: Abnormal   Collection Time   10/20/12  3:45 AM      Component Value Range Comment   Prothrombin Time 15.6 (*) 11.6 - 15.2 seconds    INR 1.27  0.00 - 1.49   BASIC METABOLIC PANEL     Status: Abnormal   Collection Time   10/20/12  3:45 AM      Component Value Range Comment   Sodium 130 (*) 135 - 145 mEq/L    Potassium 3.8  3.5 - 5.1 mEq/L    Chloride 90 (*) 96 - 112 mEq/L    CO2 34 (*) 19 - 32 mEq/L    Glucose, Bld 145 (*) 70 - 99 mg/dL    BUN 20  6 - 23 mg/dL    Creatinine, Ser 4.09  0.50 - 1.35 mg/dL    Calcium 8.5  8.4 - 81.1 mg/dL    GFR calc non Af Amer 63 (*) >90 mL/min    GFR calc Af Amer 73 (*) >90 mL/min   GLUCOSE, CAPILLARY     Status: Abnormal   Collection Time   10/20/12  8:02 AM      Component  Value Range Comment   Glucose-Capillary 160 (*) 70 - 99 mg/dL   CBC  Status: Abnormal   Collection Time   10/20/12 10:35 AM      Component Value Range Comment   WBC 8.7  4.0 - 10.5 K/uL    RBC 3.61 (*) 4.22 - 5.81 MIL/uL    Hemoglobin 8.7 (*) 13.0 - 17.0 g/dL    HCT 96.0 (*) 45.4 - 52.0 %    MCV 81.4  78.0 - 100.0 fL    MCH 24.1 (*) 26.0 - 34.0 pg    MCHC 29.6 (*) 30.0 - 36.0 g/dL    RDW 09.8 (*) 11.9 - 15.5 %    Platelets 236  150 - 400 K/uL   GLUCOSE, CAPILLARY     Status: Abnormal   Collection Time   10/20/12 11:42 AM      Component Value Range Comment   Glucose-Capillary 156 (*) 70 - 99 mg/dL   GLUCOSE, CAPILLARY     Status: Abnormal   Collection Time   10/20/12  4:54 PM      Component Value Range Comment   Glucose-Capillary 198 (*) 70 - 99 mg/dL   MAGNESIUM     Status: Normal   Collection Time   10/20/12  5:00 PM      Component Value Range Comment   Magnesium 1.8  1.5 - 2.5 mg/dL   BASIC METABOLIC PANEL     Status: Abnormal   Collection Time   10/21/12  3:46 AM      Component Value Range Comment   Sodium 129 (*) 135 - 145 mEq/L    Potassium 3.9  3.5 - 5.1 mEq/L    Chloride 89 (*) 96 - 112 mEq/L    CO2 34 (*) 19 - 32 mEq/L    Glucose, Bld 171 (*) 70 - 99 mg/dL    BUN 24 (*) 6 - 23 mg/dL    Creatinine, Ser 1.47  0.50 - 1.35 mg/dL    Calcium 8.6  8.4 - 82.9 mg/dL    GFR calc non Af Amer 53 (*) >90 mL/min    GFR calc Af Amer 61 (*) >90 mL/min   CBC     Status: Abnormal   Collection Time   10/21/12  3:46 AM      Component Value Range Comment   WBC 7.9  4.0 - 10.5 K/uL    RBC 3.39 (*) 4.22 - 5.81 MIL/uL    Hemoglobin 8.1 (*) 13.0 - 17.0 g/dL    HCT 56.2 (*) 13.0 - 52.0 %    MCV 82.6  78.0 - 100.0 fL    MCH 23.9 (*) 26.0 - 34.0 pg    MCHC 28.9 (*) 30.0 - 36.0 g/dL    RDW 86.5 (*) 78.4 - 15.5 %    Platelets 264  150 - 400 K/uL   PROTIME-INR     Status: Normal   Collection Time   10/21/12  5:28 AM      Component Value Range Comment   Prothrombin Time 13.8  11.6 - 15.2  seconds    INR 1.07  0.00 - 1.49   GLUCOSE, CAPILLARY     Status: Abnormal   Collection Time   10/21/12  7:35 AM      Component Value Range Comment   Glucose-Capillary 134 (*) 70 - 99 mg/dL   GLUCOSE, CAPILLARY     Status: Abnormal   Collection Time   10/21/12 11:40 AM      Component Value Range Comment   Glucose-Capillary 180 (*) 70 - 99 mg/dL     IMAGING:  No results found.  IMPRESSION: Principal Problem:  *Acute respiratory failure with hypoxia Active Problems:  Acute on chronic diastolic congestive heart failure  Anasarca  Leg pain  Diabetes mellitus type 2 with peripheral artery disease  COPD (chronic obstructive pulmonary disease)  Urinary retention  UTI (lower urinary tract infection)  Cor pulmonale, PA pressures in the 70s by echo [id number]  Fracture of tibia, distal, right, closed, surgery 08/17/12  Anemia of chronic disease  Acute kidney injury, improved  Obesity (BMI 30-39.9)  Abdominal distention, transient ileus secondary to narcotics  Hypertensive heart disease  Chronic urethral stricture  Junctional ectopic tachycardia  Acute gouty arthritis   RECOMMENDATION: Dr Allyson Sabal to see- I discussed the case with our doppler technician at the office. She feels that we should be able to get some useful information with a formal study, even if pt is unable to tolerate compression for ABIs. I have order this.  Time Spent Directly with Patient: 40 minutes  KILROY,LUKE K 10/21/2012, 11:49 AM   Agree with note written by Corine Shelter PAC  Asked to see pt with anasarca and bulla on LLE. He is well known to our practice with PAD s/p LCIA PTA / Stent by myself in 2009 with a known occluded total LSFA. He also has moderate LRAS which we have followed as well. His other probs include recently discontinued tob, HTN, DM. Last echo 12/13 showed nl LV systolic function with moderately severe concentric LVH and grade 3 diastolic dysfunction. His swelling apparently started after a fall in  Nov and fx of RLE requiring an orthopedic procedure. He say that his wgt is up 50# over the past 4-6 weeks as well as increased SOB. On exam his legs are wrapped but he clearly has anasarca, He is on spironolactone and iv lasix , and is diuresing. I do not think that any of this is related to arterial insufficiency but rather fluid retention prob from diastolic dysfunction. Venous dopplers were neg for DVT and he is on Lovenox. Agree with continued diuresis and local wound care.  Runell Gess 10/22/2012 5:21 PM

## 2012-10-22 ENCOUNTER — Inpatient Hospital Stay (HOSPITAL_COMMUNITY): Payer: 59

## 2012-10-22 DIAGNOSIS — N35919 Unspecified urethral stricture, male, unspecified site: Secondary | ICD-10-CM

## 2012-10-22 LAB — BASIC METABOLIC PANEL
BUN: 29 mg/dL — ABNORMAL HIGH (ref 6–23)
Chloride: 90 mEq/L — ABNORMAL LOW (ref 96–112)
Creatinine, Ser: 1.28 mg/dL (ref 0.50–1.35)
Glucose, Bld: 187 mg/dL — ABNORMAL HIGH (ref 70–99)
Potassium: 4.2 mEq/L (ref 3.5–5.1)

## 2012-10-22 MED ORDER — OXYCODONE HCL 5 MG PO TABS
5.0000 mg | ORAL_TABLET | ORAL | Status: DC | PRN
  Administered 2012-10-22 – 2012-10-23 (×2): 5 mg via ORAL
  Filled 2012-10-22 (×2): qty 1

## 2012-10-22 MED ORDER — TRAMADOL HCL 50 MG PO TABS: 50.0000 mg | ORAL_TABLET | Freq: Four times a day (QID) | ORAL | Status: DC | PRN

## 2012-10-22 MED ORDER — BISACODYL 10 MG RE SUPP: 10.0000 mg | Freq: Every day | RECTAL | Status: DC | PRN

## 2012-10-22 MED ORDER — MORPHINE SULFATE 2 MG/ML IJ SOLN
2.0000 mg | Freq: Once | INTRAMUSCULAR | Status: AC
  Administered 2012-10-22: 2 mg via INTRAVENOUS
  Filled 2012-10-22: qty 1

## 2012-10-22 MED ORDER — POLYETHYLENE GLYCOL 3350 17 G PO PACK
17.0000 g | PACK | Freq: Every day | ORAL | Status: DC
  Administered 2012-10-22: 17 g via ORAL
  Filled 2012-10-22 (×2): qty 1

## 2012-10-22 MED ORDER — OXYCODONE HCL 5 MG PO TABS
5.0000 mg | ORAL_TABLET | ORAL | Status: DC | PRN
  Administered 2012-10-22 (×2): 10 mg via ORAL
  Filled 2012-10-22 (×2): qty 2

## 2012-10-22 MED ORDER — ENOXAPARIN (LOVENOX) PATIENT EDUCATION KIT
PACK | Freq: Once | Status: AC
  Administered 2012-10-22: 13:00:00
  Filled 2012-10-22: qty 1

## 2012-10-22 NOTE — Progress Notes (Signed)
Physical Therapy Treatment Patient Details Name: Terry [name] MRN: [id number] DOB: Dec 20, 1945 Today's Date: 10/22/2012 Time: 4098-1191 PT Time Calculation (min): 34 min  PT Assessment / Plan / Recommendation Comments on Treatment Session  Pt pre medicated prior to sesssion and requested to get OOB to go to the [facility].  Pt progressing slolwy and requires + 2 assist for safety.  Pt was able to stand and pivot to [facility] and puts minimal WB thru R LE (s/p tib/fib Fx)  Pt still hopeful to D/C to home however has yet to functionally amb.    Follow Up Recommendations  SNF;Home health PT;Supervision/Assistance - 24 hour     Does the patient have the potential to tolerate intense rehabilitation     Barriers to Discharge        Equipment Recommendations  Hospital bed    Recommendations for Other Services    Frequency Min 3X/week   Plan Discharge plan remains appropriate    Precautions / Restrictions Precautions Precautions: Back;Fall Restrictions Weight Bearing Restrictions: No Other Position/Activity Restrictions: Pt with recent tib/fib fx in Dec and was told to put minimum weight on RLE    Pertinent Vitals/Pain C/o 10/10 R LE pain during act Pre medicated    Mobility  Bed Mobility Bed Mobility: Supine to Sit;Sitting - Scoot to Edge of Bed Supine to Sit: 3: Mod assist Sitting - Scoot to Edge of Bed: 3: Mod assist Details for Bed Mobility Assistance: Mod Assist to support R LE off/on bed plus increased time Transfers Transfers: Sit to Stand;Stand to Sit;Stand Pivot Transfers Sit to Stand: 1: +2 Total assist;From bed;From toilet Sit to Stand: Patient Percentage: 70% Stand to Sit: 1: +2 Total assist;To bed;To toilet Stand to Sit: Patient Percentage: 70% Stand Pivot Transfers: 1: +2 Total assist Stand Pivot Transfers: Patient Percentage: 70% Details for Transfer Assistance: 50% VC's  on safety with turn complettion prior to sit and hand placement to control decend.  Pt puts minimal WB thru  R LE and requires increased time. Assisted pt from bed to [facility] then back to bed.      PT Goals                                                    Progressing slowly    Visit Information  Last PT Received On: 10/22/12 Assistance Needed: +2    Subjective Data  Subjective: I need to use the bed side commode Patient Stated Goal: home and get better   Cognition    good   Balance   fair with RW  End of Session PT - End of Session Equipment Utilized During Treatment: Gait belt;Oxygen Activity Tolerance: Patient limited by fatigue;Patient limited by pain Patient left: in bed;with call bell/phone within reach Nurse Communication: Mobility status   Felecia Shelling  PTA [facility]  Acute  Rehab Pager      [phone]

## 2012-10-22 NOTE — Progress Notes (Signed)
Subjective:  C/o right leg pain.  Vascular saw yesterday and dopplers are pending.  Not SOB.  Diuresed 4 liters yesterday.    Objective:  Vital Signs in the last 24 hours: BP 183/89  Pulse 87  Temp 97.7 F (36.5 C) (Oral)  Resp 20  Ht 6\' 1"  (1.854 m)  Wt 112.2 kg (247 lb 5.7 oz)  BMI 32.63 kg/m2  SpO2 100%  Physical Exam: Pleasant BM in NAD Lungs: Faint wheezing Cardiac:  Regular rhythm, normal S1 and S2, no S3, 1/6 systolic murmur Abdomen:  Soft, nontender, no masses Extremities:  2+ edema present legs bandaged  Intake/Output from previous day: 02/04 0701 - 02/05 0700 In: 1280 [P.O.:840; I.V.:440] Out: 4225 [Urine:4225] Weight Filed Weights   10/21/12 0505 10/21/12 1245 10/22/12 0717  Weight: 118.842 kg (262 lb) 113.172 kg (249 lb 8 oz) 112.2 kg (247 lb 5.7 oz)    Lab Results: Basic Metabolic Panel:  Basename 10/22/12 0550 10/21/12 0346  NA 131* 129*  K 4.2 3.9  CL 90* 89*  CO2 35* 34*  GLUCOSE 187* 171*  BUN 29* 24*  CREATININE 1.28 1.35    CBC:  Basename 10/21/12 1255 10/21/12 0346  WBC 7.9 7.9  NEUTROABS -- --  HGB 8.7* 8.1*  HCT 29.0* 28.0*  MCV 82.4 82.6  PLT 245 264    BNP    Component Value Date/Time   PROBNP 2380.0* 10/13/2012 1403    Telemetry: Normal sinus rhythm at present  Assessment/Plan:  1. Anasarca due to right heart failure, diuresing 2. Acute on chronic diastolic heart failure improved with diuresis  3. COPD with secondary pulmonary hypertension, breathing is improved. 4. Hypertensive heart disease 5. RIght leg pain  Rec:  Suspect at least 20 pounds of fluid remains.  Continue to diurese.  I would restrict fluids to 2 liters/day.  Darden Palmer  MD [facility] Cardiology  10/22/2012, 8:58 AM

## 2012-10-22 NOTE — Progress Notes (Signed)
Subjective: No significant complaints, does have lt leg pain at rest.    Objective: Vital signs in last 24 hours: Temp:  [97.7 F (36.5 C)-98 F (36.7 C)] 97.7 F (36.5 C) (02/05 0500) Pulse Rate:  [87-94] 87  (02/05 0500) Resp:  [20] 20  (02/05 0500) BP: (153-209)/(79-99) 183/89 mmHg (02/05 0506) SpO2:  [99 %-100 %] 100 % (02/05 0500) Weight:  [112.2 kg (247 lb 5.7 oz)-113.172 kg (249 lb 8 oz)] 112.2 kg (247 lb 5.7 oz) (02/05 0717) Weight change: -5.67 kg (-12 lb 8 oz) Last BM Date: 10/21/12 Intake/Output from previous day:-3785 02/04 0701 - 02/05 0700 In: 1280 [P.O.:840; I.V.:440] Out: 4225 [Urine:4225] Intake/Output this shift: Total I/O In: -  Out: 950 [Urine:950]  PE: General:alert and oriented, pleasant affect Heart:S1S2 RRR soft murmur Lungs:clear ant. Abd:+ BS non tender Ext:2+ edema, legs bandaged secondary to blisters that opened.    Lab Results:  [facility] 10/21/12 1255 10/21/12 0346  WBC 7.9 7.9  HGB 8.7* 8.1*  HCT 29.0* 28.0*  PLT 245 264   BMET  Basename 10/22/12 0550 10/21/12 0346  NA 131* 129*  K 4.2 3.9  CL 90* 89*  CO2 35* 34*  GLUCOSE 187* 171*  BUN 29* 24*  CREATININE 1.28 1.35  CALCIUM 8.8 8.6   No results found for this basename: TROPONINI:2,CK,MB:2 in the last 72 hours  Lab Results  Component Value Date   CHOL  Value: 158        ATP III CLASSIFICATION:  <200     mg/dL   Desirable  161-096  mg/dL   Borderline High  >=045    mg/dL   High        4/0/9811   HDL 58 10/18/2010   LDLCALC  Value: 87        Total Cholesterol/HDL:CHD Risk Coronary Heart Disease Risk Table                     Men   Women  1/2 Average Risk   3.4   3.3  Average Risk       5.0   4.4  2 X Average Risk   9.6   7.1  3 X Average Risk  23.4   11.0        Use the calculated Patient Ratio above and the CHD Risk Table to determine the patient's CHD Risk.        ATP III CLASSIFICATION (LDL):  <100     mg/dL   Optimal  914-782  mg/dL   Near or Above                    Optimal   130-159  mg/dL   Borderline  956-213  mg/dL   High  >086     mg/dL   Very High 01/22/8468   TRIG 66 10/18/2010   CHOLHDL 2.7 10/18/2010   Lab Results  Component Value Date   HGBA1C 7.3* 10/08/2012     Lab Results  Component Value Date   TSH 1.209 10/08/2012    Studies/Results: No results found.  Medications: I have reviewed the patient's current medications.    Marland Kitchen antiseptic oral rinse  15 mL Mouth Rinse BID  . bisacodyl  10 mg Rectal Daily  . diltiazem  360 mg Oral Daily  . docusate sodium  100 mg Oral BID  . enoxaparin (LOVENOX) injection  115 mg Subcutaneous Q12H  . furosemide  80 mg Intravenous Q8H  . gabapentin  300 mg Oral TID  . hydrALAZINE  50 mg Oral Q8H  . insulin aspart  0-15 Units Subcutaneous TID WC  . isosorbide mononitrate  60 mg Oral Daily  . methocarbamol  500 mg Oral TID  . OxyCODONE  20 mg Oral Q12H  . predniSONE  50 mg Oral Q breakfast  . sodium chloride  10-40 mL Intracatheter Q12H  . spironolactone  50 mg Oral BID  . Tamsulosin HCl  0.4 mg Oral Daily  . tiotropium  18 mcg Inhalation Daily  Assessment/Plan: Principal Problem:  *Acute respiratory failure with hypoxia Active Problems:  Fracture of tibia, distal, right, closed, surgery 08/17/12  Diabetes mellitus type 2 with peripheral artery disease  Anemia of chronic disease  Acute kidney injury, improved  Peripheral vascular disease  Obesity (BMI 30-39.9)  Hypertensive heart disease  COPD (chronic obstructive pulmonary disease)  Acute on chronic diastolic congestive heart failure  Chronic urethral stricture  Urinary retention  UTI (lower urinary tract infection)  Anasarca  Cor pulmonale, PA pressures in the 70s by echo [id number]  Junctional ectopic tachycardia  Acute gouty arthritis  Leg pain  PLAN: arterial dopplers done yesterday- per pt, awaiting results.  Dr. Allyson Sabal to see with recommendations. Unsure if pain related to PV disease or edema with blisters.  Diuresing with neg 3 liters last 24 hours.       LOS: 15 days   INGOLD,LAURA R 10/22/2012, 8:22 AM    Agree with note written by Nada Boozer RNP  Continues to diurese. Suspect fluid retention secondary to diastolic dysfunction/ Right heart failure. Continue to diurese as you are doing along with local wound care. No further recommendations. Will sign off.  Runell Gess 10/22/2012 5:31 PM

## 2012-10-22 NOTE — Progress Notes (Signed)
[facility] PROGRESS NOTE  Terry [name] ZOX:[id number] DOB: 29-Jun-1946 DOA: 10/05/2012 PCP: No primary provider on file.  Brief narrative:  67 y/o obese Male with hx of HTN, DM, PVD, diastolic dysfunction, COPD with cor pulmonale, urinary retention on foley , chronic low back pain,recent right tibial fracture repair presented to ED with no urine output for 24 hrs and found to be hypoxic.  He was admitted for evaluation of hypoxia. VQ scan showed low probability for pulmonary embolism. Lower extremity doppler study negative for DVT. He was started on Lasix in ED for mild interstitial edema based on chest x-ray. In addition, creatinine was 1.65 on this admission. Lasix was temporarily discontinued due to development of hypotension and worsening kidney function.  Patient continued to experience abdominal pain and distention. Abdominal x-ray done on 01/24 showed possible constipation. Repeat abdominal x-ray showed possible colonic ileus and CT abdomen/pelvis done 01/26 showed concern for the same. Hospital stay prolonged with development of anasarca needing aggressive diuresis.   Assessment/Plan:  Diastolic CHF, Acute on chronic With anasarca  Pro BNP on admission 5080 . A 2-D echo was done in December 2013 with normal ejection fraction and grade 1 diastolic dysfunction. However has cor pulmonale with severe PAH.  Lasix discontinued with worsening renal function but then developed volume overload with SOB and leg edema.  -resumed IV lasix and up titrated to 80 mg tid. Renal fn slowly worsening but  . He informs his normal weight to be just under 200 lbs. and is  248 Lbs as per 2/3 ( weight not accurately charted today.) .  -Diuresing well, about 4L out in past 24 hours -Continue current IV Lasix and spironolactone and 2 L fluid restriction added today 2/5 per cardiology -appreciate cardiology recs.   Abdominal distention, pain  Secondary to colonic ileus  -required enemas and aggressive bowel  regimen. Reduced dose of narcotics. Now resolved.  was seen by [facility].   Active Problems:  Right upper extremity DVT  In proximal right axillary vein  Dr Elisabeth Pigeon spoke with PCCM on call following admission and recommendation was to take PICC line out and continue anticoagulation for next 3 months . Left UE PICC placed.  continuing lovenox per pharmacy.   Acute respiratory failure with hypoxia  -likely more in the setting of diastolic dysfn and core pulmonale  2D echo reviewed as above  -now titrated to [place] from face mask.  Dr Donnie Aho following -seen by pulmonary. Plan for outpt follow up with PFTs   Hypertensive heart disease  - Continue long acting Cardizem - 360 mg daily  Continue Imdur 60 mg daily. Added scheduled hydralazine. Continue  prn hydralazine. -Pain likely contributing to elevated blood pressures-improve pain control -see below COPD (chronic obstructive pulmonary disease)  Continue nebs . Titrated to to 3L via [place]   Urinary retention  Secondary to chronic urethral stricture and foley placed on recent hospitalization for leg surgery.  has good urine output . Follow with urology as outpt   UTI (lower urinary tract infection)  Completed 7 day course with rocephin   Fracture of tibia, distal, right, closed  Stable . Continue pain meds and encouraged PT.   Bilateral leg pain  Seems to be chronic and now has painful LLE blisters. Continue dressing . Continue when necessary morphine, oxycontin Neurontin for pain control.  Patient [name]/o persistent rt knee and ankle pain. Had sx in the leg recently . x ray of rt knee and ankle shows no acute insult. Marland Kitchen  Could have possible gout with hx of it and now on diuretic. Elevated uric acid noted. treating with a course of prednisone.  The LLE pain and blisters seem new ( only 3 weeks per patient). 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 Unclear if this is new ischemia . He has feeble distal pulsation on doppler. -Today 2/5 with increased right lower extremity pain, oxycodone added-ultram DC'ed -Await ABI and [facility] attending input for further recommendations Diabetes mellitus type 2 with complications  A1c 7.3  Continue sliding scale insulin   Anemia of chronic disease  No signs of active bleed  Slight drop in H&H 2/4.  Follow and recheck in am   Acute kidney injury  Secondary to UTI, urinary retention and Lasix  currently Resolved. Monitor as patient back  on high dose lasix   Hyponatremia  likely int he setting of lasix. Monitor labs closely   Obesity (BMI 30-39.9)  Nutrition consulted   Consults:  [facility] / Dr Donnie Aho [facility] for LLE pain and hx of PVD Pulmonary ( patient will follow as outpt for PFT) [facility]   Code Status: Full code  Family Communication: wife at bedside  Disposition Plan: home with [facility]. Patient refusing SNF . Will likely be in the hospital for next few days for ongoing aggressive diuresis.    HPI/Subjective: Still with increased R. Hip/leg pain. Diuresing well. Denies chest pain and shortness of breath. He states Ultram does not help his pain and is requesting IR oxycodone  Objective: Filed Vitals:   10/22/12 0500 10/22/12 0506 10/22/12 0717 10/22/12 0907  BP: 209/99 183/89    Pulse: 87     Temp: 97.7 F (36.5 [name])     TempSrc: Oral     Resp: 20     Height:      Weight:   112.2 kg (247 lb 5.7 oz)   SpO2: 100%   97%    Intake/Output Summary (Last 24 hours) at 10/22/12 0953 Last data filed at 10/22/12 1610  Gross per 24 hour  Intake   1280 ml  Output   5175 ml  Net  -3895 ml   Filed Weights   10/21/12 0505 10/21/12 1245 10/22/12 0717  Weight: 118.842 kg (262 lb) 113.172 kg (249 lb 8 oz) 112.2 kg (247 lb 5.7 oz)    Exam:  General: Elderly obese male lying in bed, minimal distress  Heent: no pallor, moist  oral mucosa, JVD not appreciated  Cardiovascular: NS 1&S2, no murmurs, rub or gallop  Respiratory: equal air entry b/l. No crackles or rhonchi heard  Abdomen: soft, NT, distended. BS+, pitting edema present  EXT: Warm, 2 + pitting edema b/l ( improving) , left leg bandaged with dressing clean and dry tender b/l LE with limited ROM of knee and ankle , scrotal edema , foley in place. CNS: AAOx 3, non focal   Data Reviewed: Basic Metabolic Panel:  Lab 10/22/12 9604 10/21/12 0346 10/20/12 1700 10/20/12 0345 10/19/12 0500 10/18/12 0440  NA 131* 129* -- 130* 135 135  K 4.2 3.9 -- 3.8 3.7 3.5  CL 90* 89* -- 90* 96 96  CO2 35* 34* -- 34* 31 31  GLUCOSE 187* 171* -- 145* 123* 105*  BUN 29* 24* -- 20 18 18   CREATININE 1.28 1.35 -- 1.17 1.15 1.12  CALCIUM 8.8 8.6 --  8.5 8.4 8.5  MG -- -- 1.8 -- -- --  PHOS -- -- -- -- -- --   Liver Function Tests: No results found for this basename: AST:5,ALT:5,ALKPHOS:5,BILITOT:5,PROT:5,ALBUMIN:5 in the last 168 hours No results found for this basename: LIPASE:5,AMYLASE:5 in the last 168 hours No results found for this basename: AMMONIA:5 in the last 168 hours CBC:  Lab 10/21/12 1255 10/21/12 0346 10/20/12 1035 10/18/12 0440 10/16/12 0447  WBC 7.9 7.9 8.7 5.5 6.3  NEUTROABS -- -- -- -- --  HGB 8.7* 8.1* 8.7* 8.5* 9.0*  HCT 29.0* 28.0* 29.4* 29.0* 30.4*  MCV 82.4 82.6 81.4 81.2 80.9  PLT 245 264 236 216 248   Cardiac Enzymes: No results found for this basename: CKTOTAL:5,CKMB:5,CKMBINDEX:5,TROPONINI:5 in the last 168 hours BNP (last 3 results)  Basename 10/13/12 1403 09/23/2012 2125 08/22/12 0456  PROBNP 2380.0* 5080.0* 5872.0*   CBG:  Lab 10/22/12 0735 10/21/12 1643 10/21/12 1140 10/21/12 0735 10/20/12 1654  GLUCAP 291* 265* 180* 134* 198*    No results found for this or any previous visit (from the past 240 hour(s)).   Studies: No results found.  Scheduled Meds:    . antiseptic oral rinse  15 mL Mouth Rinse BID  . diltiazem  360 mg  Oral Daily  . docusate sodium  100 mg Oral BID  . enoxaparin (LOVENOX) injection  115 mg Subcutaneous Q12H  . furosemide  80 mg Intravenous Q8H  . gabapentin  300 mg Oral TID  . hydrALAZINE  50 mg Oral Q8H  . insulin aspart  0-15 Units Subcutaneous TID WC  . isosorbide mononitrate  60 mg Oral Daily  . methocarbamol  500 mg Oral TID  . OxyCODONE  20 mg Oral Q12H  . polyethylene glycol  17 g Oral Daily  . predniSONE  50 mg Oral Q breakfast  . sodium chloride  10-40 mL Intracatheter Q12H  . spironolactone  50 mg Oral BID  . Tamsulosin HCl  0.4 mg Oral Daily  . tiotropium  18 mcg Inhalation Daily     Time spent: 25 minutes    Terry [name]  [facility] Pager [phone]. If 8PM-8AM, please contact night-coverage at [email or web address], password [facility] 10/22/2012, 9:53 AM  LOS: 15 days

## 2012-10-23 ENCOUNTER — Other Ambulatory Visit: Payer: Self-pay

## 2012-10-23 ENCOUNTER — Inpatient Hospital Stay (HOSPITAL_COMMUNITY): Payer: 59

## 2012-10-23 ENCOUNTER — Encounter (HOSPITAL_COMMUNITY): Payer: Self-pay | Admitting: Cardiology

## 2012-10-23 DIAGNOSIS — I469 Cardiac arrest, cause unspecified: Secondary | ICD-10-CM | POA: Diagnosis not present

## 2012-10-23 DIAGNOSIS — R578 Other shock: Secondary | ICD-10-CM

## 2012-10-23 DIAGNOSIS — R58 Hemorrhage, not elsewhere classified: Secondary | ICD-10-CM | POA: Diagnosis present

## 2012-10-23 DIAGNOSIS — I959 Hypotension, unspecified: Secondary | ICD-10-CM

## 2012-10-23 DIAGNOSIS — D62 Acute posthemorrhagic anemia: Secondary | ICD-10-CM | POA: Diagnosis not present

## 2012-10-23 DIAGNOSIS — G931 Anoxic brain damage, not elsewhere classified: Secondary | ICD-10-CM

## 2012-10-23 LAB — BLOOD GAS, ARTERIAL
Acid-Base Excess: 2 mmol/L (ref 0.0–2.0)
Acid-base deficit: 15.3 mmol/L — ABNORMAL HIGH (ref 0.0–2.0)
Bicarbonate: 17.4 mEq/L — ABNORMAL LOW (ref 20.0–24.0)
Drawn by: 331001
FIO2: 1 %
MECHVT: 620 mL
O2 Content: 4 L/min
O2 Saturation: 90.8 %
PEEP: 5 cmH2O
Patient temperature: 37
Patient temperature: 37
RATE: 18 resp/min
TCO2: 15.3 mmol/L (ref 0–100)
pCO2 arterial: 40.3 mmHg (ref 35.0–45.0)
pCO2 arterial: 43.8 mmHg (ref 35.0–45.0)
pH, Arterial: 7.426 (ref 7.350–7.450)
pH, Arterial: 7.224 — ABNORMAL LOW (ref 7.350–7.450)
pO2, Arterial: 86.7 mmHg (ref 80.0–100.0)
pO2, Arterial: 111 mmHg — ABNORMAL HIGH (ref 80.0–100.0)

## 2012-10-23 LAB — HEMOGLOBIN AND HEMATOCRIT, BLOOD
HCT: 19.2 % — ABNORMAL LOW (ref 39.0–52.0)
HCT: 28 % — ABNORMAL LOW (ref 39.0–52.0)
Hemoglobin: 8.9 g/dL — ABNORMAL LOW (ref 13.0–17.0)

## 2012-10-23 LAB — ABO/RH: ABO/RH(D): O POS

## 2012-10-23 LAB — BASIC METABOLIC PANEL
BUN: 36 mg/dL — ABNORMAL HIGH (ref 6–23)
BUN: 37 mg/dL — ABNORMAL HIGH (ref 6–23)
CO2: 37 mEq/L — ABNORMAL HIGH (ref 19–32)
Calcium: 8.5 mg/dL (ref 8.4–10.5)
Calcium: 7.4 mg/dL — ABNORMAL LOW (ref 8.4–10.5)
Chloride: 87 mEq/L — ABNORMAL LOW (ref 96–112)
Chloride: 86 mEq/L — ABNORMAL LOW (ref 96–112)
Creatinine, Ser: 1.64 mg/dL — ABNORMAL HIGH (ref 0.50–1.35)
Creatinine, Ser: 1.61 mg/dL — ABNORMAL HIGH (ref 0.50–1.35)
GFR calc Af Amer: 48 mL/min — ABNORMAL LOW (ref >90)
GFR calc Af Amer: 49 mL/min — ABNORMAL LOW (ref >90)
GFR calc non Af Amer: 43 mL/min — ABNORMAL LOW (ref >90)
Glucose, Bld: 243 mg/dL — ABNORMAL HIGH (ref 70–99)
Glucose, Bld: 279 mg/dL — ABNORMAL HIGH (ref 70–99)
Glucose, Bld: 251 mg/dL — ABNORMAL HIGH (ref 70–99)
Potassium: 4.4 mEq/L (ref 3.5–5.1)
Potassium: 5.1 mEq/L (ref 3.5–5.1)
Sodium: 130 mEq/L — ABNORMAL LOW (ref 135–145)

## 2012-10-23 LAB — CBC
HCT: 22.2 % — ABNORMAL LOW (ref 39.0–52.0)
HCT: 17.3 % — ABNORMAL LOW (ref 39.0–52.0)
HCT: 27.6 % — ABNORMAL LOW (ref 39.0–52.0)
Hemoglobin: 6.7 g/dL — CL (ref 13.0–17.0)
Hemoglobin: 4.9 g/dL — CL (ref 13.0–17.0)
MCH: 24.6 pg — ABNORMAL LOW (ref 26.0–34.0)
MCHC: 28.3 g/dL — ABNORMAL LOW (ref 30.0–36.0)
MCHC: 33.3 g/dL (ref 30.0–36.0)
MCV: 81.6 fL (ref 78.0–100.0)
MCV: 86.5 fL (ref 78.0–100.0)
Platelets: 262 10*3/uL (ref 150–400)
Platelets: 155 10*3/uL (ref 150–400)
RBC: 2.72 MIL/uL — ABNORMAL LOW (ref 4.22–5.81)
RDW: 26.1 % — ABNORMAL HIGH (ref 11.5–15.5)
RDW: 19.6 % — ABNORMAL HIGH (ref 11.5–15.5)
WBC: 13.6 10*3/uL — ABNORMAL HIGH (ref 4.0–10.5)
WBC: 11.6 10*3/uL — ABNORMAL HIGH (ref 4.0–10.5)
WBC: 23.4 10*3/uL — ABNORMAL HIGH (ref 4.0–10.5)

## 2012-10-23 LAB — TROPONIN I
Troponin I: <0.3 ng/mL (ref <0.30)
Troponin I: <0.3 ng/mL (ref <0.30)
Troponin I: 4.83 ng/mL (ref <0.30)

## 2012-10-23 LAB — PREPARE RBC (CROSSMATCH)

## 2012-10-23 LAB — GLUCOSE, CAPILLARY: Glucose-Capillary: 261 mg/dL — ABNORMAL HIGH (ref 70–99)

## 2012-10-23 LAB — APTT: aPTT: 24 seconds (ref 24–37)

## 2012-10-23 LAB — DIC (DISSEMINATED INTRAVASCULAR COAGULATION)PANEL
D-Dimer, Quant: 10.77 ug/mL-FEU — ABNORMAL HIGH (ref 0.00–0.48)
Fibrinogen: 305 mg/dL (ref 204–475)
Smear Review: NONE SEEN

## 2012-10-23 LAB — PROTIME-INR: INR: 1.47 (ref 0.00–1.49)

## 2012-10-23 MED ORDER — PROTAMINE SULFATE 10 MG/ML IV SOLN
60.0000 mg | Freq: Once | INTRAVENOUS | Status: DC
  Filled 2012-10-23: qty 6

## 2012-10-23 MED ORDER — NOREPINEPHRINE BITARTRATE 1 MG/ML IJ SOLN: 2.0000 ug/min | INTRAVENOUS | Status: DC

## 2012-10-23 MED ORDER — MORPHINE SULFATE 4 MG/ML IJ SOLN
4.0000 mg | Freq: Once | INTRAMUSCULAR | Status: AC
  Administered 2012-10-23: 4 mg via INTRAVENOUS
  Filled 2012-10-23: qty 1

## 2012-10-23 MED ORDER — NALOXONE HCL 0.4 MG/ML IJ SOLN
INTRAMUSCULAR | Status: AC
  Administered 2012-10-23: 0.4 mg
  Filled 2012-10-23: qty 1

## 2012-10-23 MED ORDER — HYDROCORTISONE SOD SUCCINATE 100 MG IJ SOLR
50.0000 mg | Freq: Four times a day (QID) | INTRAMUSCULAR | Status: DC
  Administered 2012-10-23 – 2012-10-24 (×5): 50 mg via INTRAVENOUS
  Filled 2012-10-23 (×2): qty 2
  Filled 2012-10-23 (×2): qty 1
  Filled 2012-10-23: qty 2
  Filled 2012-10-23 (×2): qty 1
  Filled 2012-10-23: qty 2

## 2012-10-23 MED ORDER — SODIUM CHLORIDE 0.9 % IV SOLN: 250.0000 mL | INTRAVENOUS | Status: DC | PRN

## 2012-10-23 MED ORDER — OXYCODONE HCL ER 10 MG PO T12A: 10.0000 mg | EXTENDED_RELEASE_TABLET | Freq: Two times a day (BID) | ORAL | Status: DC

## 2012-10-23 MED ORDER — INSULIN ASPART 100 UNIT/ML ~~LOC~~ SOLN
0.0000 [IU] | SUBCUTANEOUS | Status: DC
  Administered 2012-10-23: 5 [IU] via SUBCUTANEOUS
  Administered 2012-10-23: 8 [IU] via SUBCUTANEOUS
  Administered 2012-10-24: 3 [IU] via SUBCUTANEOUS
  Administered 2012-10-24: 8 [IU] via SUBCUTANEOUS
  Administered 2012-10-24: 3 [IU] via SUBCUTANEOUS
  Administered 2012-10-24: 2 [IU] via SUBCUTANEOUS
  Administered 2012-10-24: 5 [IU] via SUBCUTANEOUS

## 2012-10-23 MED ORDER — CHLORHEXIDINE GLUCONATE 0.12 % MT SOLN
OROMUCOSAL | Status: AC
  Filled 2012-10-23: qty 15

## 2012-10-23 MED ORDER — ATROPINE SULFATE 0.1 MG/ML IJ SOLN
INTRAMUSCULAR | Status: AC
  Filled 2012-10-23: qty 20

## 2012-10-23 MED ORDER — HYDROMORPHONE HCL PF 2 MG/ML IJ SOLN
2.0000 mg | INTRAMUSCULAR | Status: DC | PRN
  Administered 2012-10-23: 2 mg via INTRAVENOUS
  Filled 2012-10-23: qty 1

## 2012-10-23 MED ORDER — PIPERACILLIN-TAZOBACTAM 3.375 G IVPB
3.3750 g | Freq: Three times a day (TID) | INTRAVENOUS | Status: DC
  Administered 2012-10-23 – 2012-10-24 (×5): 3.375 g via INTRAVENOUS
  Filled 2012-10-23 (×6): qty 50

## 2012-10-23 MED ORDER — DEXTROSE 5 % IV SOLN
INTRAVENOUS | Status: DC
  Administered 2012-10-23 – 2012-10-24 (×4): via INTRAVENOUS
  Filled 2012-10-23 (×7): qty 150

## 2012-10-23 MED ORDER — PHENYLEPHRINE HCL 10 MG/ML IJ SOLN
30.0000 ug/min | INTRAVENOUS | Status: DC
  Filled 2012-10-23: qty 1

## 2012-10-23 MED ORDER — VASOPRESSIN 20 UNIT/ML IJ SOLN
0.0300 [IU]/min | INTRAVENOUS | Status: DC
  Administered 2012-10-23 (×2): 0.03 [IU]/min via INTRAVENOUS
  Filled 2012-10-23 (×2): qty 2.5

## 2012-10-23 MED ORDER — CHLORHEXIDINE GLUCONATE 0.12 % MT SOLN
15.0000 mL | Freq: Two times a day (BID) | OROMUCOSAL | Status: DC
  Administered 2012-10-23 – 2012-10-24 (×3): 15 mL via OROMUCOSAL
  Filled 2012-10-23 (×3): qty 15

## 2012-10-23 MED ORDER — BIOTENE DRY MOUTH MT LIQD
15.0000 mL | Freq: Four times a day (QID) | OROMUCOSAL | Status: DC
  Administered 2012-10-23 – 2012-10-25 (×6): 15 mL via OROMUCOSAL

## 2012-10-23 MED ORDER — PANTOPRAZOLE SODIUM 40 MG IV SOLR
40.0000 mg | Freq: Every day | INTRAVENOUS | Status: DC
  Administered 2012-10-23 – 2012-10-24 (×2): 40 mg via INTRAVENOUS
  Filled 2012-10-23 (×3): qty 40

## 2012-10-23 MED ORDER — ALBUTEROL SULFATE HFA 108 (90 BASE) MCG/ACT IN AERS
8.0000 | INHALATION_SPRAY | RESPIRATORY_TRACT | Status: DC | PRN
  Filled 2012-10-23: qty 6.7

## 2012-10-23 MED ORDER — NOREPINEPHRINE BITARTRATE 1 MG/ML IJ SOLN
2.0000 ug/min | INTRAVENOUS | Status: DC
  Administered 2012-10-23: 30 ug/min via INTRAVENOUS
  Administered 2012-10-23: 20 ug/min via INTRAVENOUS
  Administered 2012-10-23: 10 ug/min via INTRAVENOUS
  Administered 2012-10-23: 30 ug/min via INTRAVENOUS
  Administered 2012-10-24: 20 ug/min via INTRAVENOUS
  Administered 2012-10-24: 10 ug/min via INTRAVENOUS
  Administered 2012-10-24 (×3): 20 ug/min via INTRAVENOUS
  Filled 2012-10-23 (×9): qty 4

## 2012-10-23 NOTE — Procedures (Signed)
L femoral CVL  L femoral CVL was placed emergently during course of CPR under semi-sterile conditions using arrow kit and Seldinger technique. There was good blood return in all ports and no apparent complication  Terry Fischer, MD ; [facility] service Mobile [phone].  After 5:30 PM or weekends, call [phone]

## 2012-10-23 NOTE — Progress Notes (Signed)
PT Cancellation Note  _X_Treatment cancelled today due to medical issues with patient which prohibited therapy.......Marland Kitchenhemoglobin 6.7 and pt transferring to step down  ___ Treatment cancelled today due to patient receiving procedure or test   ___ Treatment cancelled today due to patient's refusal to participate   Felecia Shelling  PTA [facility]  Acute  Rehab Pager      [phone]

## 2012-10-23 NOTE — Progress Notes (Signed)
Respiratory therapy attempted left radial artery a-line insertion times two by 2 different therapist. Very faint and fragmented pulse heard with doppler. Bp reading in the forties. Informed MD. That we were unable to establish a-line placement.

## 2012-10-23 NOTE — Progress Notes (Signed)
Pt transferred to step down, Report given to [employer]

## 2012-10-23 NOTE — Procedures (Signed)
R femoral A-line  A R femoral arterial catheter was placed emergently during CPR using sterile conditions, Arrow kit and Seldinger technique. There was a good waveform and no apparent complications   Terry Fischer, MD ; [facility] service Mobile [phone].  After 5:30 PM or weekends, call [phone]

## 2012-10-23 NOTE — Progress Notes (Signed)
Triponin 4.83 noted. Dr Bard Herbert aware.

## 2012-10-23 NOTE — Clinical Social Work Note (Signed)
CSW met with wife to provide support and check in due to events of am. CSW sat with wife outside ICU. This CSW has worked with Pt and wife in the past. Pt was d/c'd to SNF last fall and stayed one night at [facility] and then left AMA, per wife. Wife reports he went home without all his meds or oxygen as a result.  Wife very stressed and anxious currently. She reports to not have much local support, but has family coming later today. CSW will keep checking on her and provide ongoing support to wife.   Doreen Salvage, LCSW ICU/Stepdown Clinical Social Worker [facility] Cell [phone] Hours 8am-1200pm M-F

## 2012-10-23 NOTE — Progress Notes (Signed)
Subjective:  Significant issues this am.  Currently in process of being moved to ICU.  Develop severe pain in his right leg and abdomen last pam and now has a large right retroperitoneal hematoma.  Objective:  Vital Signs in the last 24 hours: BP 79/42  Pulse 112  Temp 98.4 F (36.9 C) (Oral)  Resp 20  Ht 6\' 1"  (1.854 m)  Wt 108.8 kg (239 lb 13.8 oz)  BMI 31.65 kg/m2  SpO2 100%  Physical Exam: Black male in significant pain exam limited to to large amount of people at beside Lungs: cleat Cardiac:  Rapid rhythm, normal S1 and S2, no S3, 1/6 systolic murmur  Intake/Output from previous day: 02/05 0701 - 02/06 0700 In: 580 [P.O.:240; I.V.:340] Out: 3150 [Urine:3150]  Weight Filed Weights   10/21/12 1245 10/22/12 0717 10/23/12 0544  Weight: 113.172 kg (249 lb 8 oz) 112.2 kg (247 lb 5.7 oz) 108.8 kg (239 lb 13.8 oz)    Lab Results: Basic Metabolic Panel:  Basename 10/23/12 0430 10/22/12 0550  NA 130* 131*  K 4.4 4.2  CL 87* 90*  CO2 37* 35*  GLUCOSE 243* 187*  BUN 33* 29*  CREATININE 1.20 1.28    CBC:  Basename 10/23/12 0430 10/21/12 1255  WBC 13.6* 7.9  NEUTROABS -- --  HGB 6.7* 8.7*  HCT 22.2* 29.0*  MCV 81.6 82.4  PLT 262 245    BNP    Component Value Date/Time   PROBNP 2380.0* 10/13/2012 1403    Telemetry: Sinus rhythm with runs of atrial tachycardia  Assessment/Plan:  1. Acute retroperitoneal hematoma with blood loss anemia  2. Anasarca due to right heart failure, diuresing 3. Acute on chronic diastolic heart failure improved with diuresis  4. COPD with secondary pulmonary hypertension, breathing is improved. 5. Hypertensive heart disease 6. Atrial tachycardia recurrent  Rec:  He is being moved to ICU.  Alternating between atrial tach and sinus.  Weight is down.  He will need to be transfused and anticoag stopped for time being. Watch rhythm. Will need to have IV cardiazem if unable to take po is atrial tach persists.  Terry Palmer   MD [facility] Cardiology  10/23/2012, 8:53 AM

## 2012-10-23 NOTE — Progress Notes (Signed)
Support for spouse Bonita Quin.  She has friends here, but family is mostly in [place]. Friends are being notified to help support her. Patient and spouse have been married 30+ years. It has been a strong relationship. Spouse said, "He is my heart and soul." Listening; presence; support; prayer.  Will continue to follow.

## 2012-10-23 NOTE — Progress Notes (Signed)
Support for patient's spouse, Bonita Quin, as patient was moved to ICU and code ensued. Ongoing support until friends arrived to support. Presence; prayer. Some family is coming from [place]. They should arrive mid afternoon. Spouse and patient have been married for 34 years. Will continue to provide support.

## 2012-10-23 NOTE — Consult Note (Addendum)
PULMONARY  / CRITICAL CARE MEDICINE  Name: Terry [name] MRN: [id number] DOB: 12-10-1945    ADMISSION DATE:  10-20-12 CONSULTATION DATE:  10/22/12  REFERRING MD :  Dr. Suanne Marker  CHIEF COMPLAINT:  Retroperitoneal Hematoma, Decreased mental status  BRIEF PATIENT DESCRIPTION: 67 y/o AAM with PMH of DM, PVD, HTN, Obesity, CHF admitted on 1/21 with complaints of no urinary output after foley change in Urology office on 1/20.  On eval, was noted to have decompensated CHF and hypoxia requiring admit.  Recent multiple admits in last 2 months after tib/fib fx with complications of urinary retention, acute renal failure, ileus, UTI, LE wounds.  Current hospital course complicated by fecal impaction, colonic ileus, rectal muscle hematoma, atrial tach, and RUE DVT (after PICC).  PICC removed and placed on full dose lovenox.  2/6 due to continued complaints of abd pain & decreased hgb, repeat CT ABD obtained which noted new large retroperitoneal hematoma (10x20).  PCCM consulted for acute decompensation / RP bleed.    SIGNIFICANT EVENTS / STUDIES:  1/21 - Admit with hypoxia, decompensated CHF 1/26 - CT ABD/Pelvis with small effusions, R colon distention concerning for fecal impaction, colonic ileus, no SBO, L rectus muscle hematoma  1/28 - UE Doppler>>>DVT RUE POSITIVE 1/30 - LE Doppler>>> neg 2/6  - CT ABD/Pelvis with new very large RP hematoma 10x20 cm, stable left lower rectus muscle hematoma 7x8.  Tx to ICU / PCCM.  Intubated.  PEA arrest post intubation.    LINES / TUBES: 2/6 OETT>>> 2/6 R Fem Aline>>> 2/6 L Fem TLC>>>   CULTURES: 2/6 MRSA PCR>>>neg  ANTIBIOTICS: Zosyn 2/6 (asp & GI)>>>  HISTORY OF PRESENT ILLNESS:   67 y/o AAM with PMH of DM, PVD, HTN, Obesity, CHF admitted on 1/21 with complaints of no urinary output after foley change in Urology office on 1/20.  On eval, was noted to have decompensated CHF and hypoxia requiring admit.  Recent multiple admits in last 2 months after tib/fib fx  with complications of urinary retention, acute renal failure, ileus, UTI, LE wounds.  Current hospital course complicated by fecal impaction, colonic ileus, rectal muscle hematoma, atrial tach, and RUE DVT (after PICC).  PICC removed and placed on full dose lovenox.  2/6 due to continued complaints of abd pain & decreased hgb, repeat CT ABD obtained which noted new large retroperitoneal hematoma (10x20).  PCCM consulted for acute decompensation / RP bleed.    He has been hospitalized most of the last 2 months. He fell out of a chair 07/3012 and suffered a fracture of his Rt leg. This required surgery 08/17/12. His post op course was complicated by resp failure which was felt to be secondary to COPD, Pulm HtN, obesity, and acute on chronic diastolic CHF. His PA pressures were in the 70s in Dec by ECHO. Required readmit 09/29/12 with urinary retention and acute renal injury. During this admission he has had junctional tachycardia, resp failure, ileus secondary to pain medications, UTI, and treatment of LE wounds. He has had swelling and pain in his legs in the past.  Venous dopplers have been negative for DVT. VQ Scan with low probability for PE.     PAST MEDICAL HISTORY :  Past Medical History  Diagnosis Date  . Diabetes mellitus without complication   . Peripheral vascular disease   . Hypertensive heart disease   . Obesity (BMI 30-39.9)   . Lumbar disc disease   . Anginal pain   . Hypertension    Past  Surgical History  Procedure Date  . Back surgery     numerous spine surgeries, 1966-2011  . Tonsillectomy   . Tibia im nail insertion 08/17/2012    Procedure: INTRAMEDULLARY (IM) NAIL TIBIAL;  Surgeon: Kerrin Champagne, MD;  Location: [facility];  Service: Orthopedics;  Laterality: Right;  . Joint replacement Bilateral hip   Prior to Admission medications   Medication Sig Start Date End Date Taking? Authorizing Provider  insulin aspart (NOVOLOG) 100 UNIT/ML injection Inject 0-15 Units into the skin 3  (three) times daily with meals. CBG < 70: Drink juice; CBG 70 - 120: 0 units: CBG 121 - 150: 2 units; CBG 151 - 200: 3 units; CBG 201 - 250: 5 units; CBG 251 - 300: 8 units;CBG 301 - 350: 11 units; CBG 351 - 400: 15 units; CBG > 400 : 15 units and notify MD 08/22/12  Yes Ripudeep Jenna Luo, MD  isosorbide mononitrate (IMDUR) 60 MG 24 hr tablet Take 60 mg by mouth daily.   Yes Historical Provider, MD  losartan-hydrochlorothiazide (HYZAAR) 100-25 MG per tablet Take 1 tablet by mouth daily.   Yes Historical Provider, MD  metformin (FORTAMET) 500 MG (OSM) 24 hr tablet Take 500 mg by mouth 3 (three) times daily.   Yes Historical Provider, MD  metoprolol (LOPRESSOR) 50 MG tablet Take 50 mg by mouth 2 (two) times daily.   Yes Historical Provider, MD  OxyCODONE (OXYCONTIN) 40 mg T12A Take 1 tablet (40 mg total) by mouth every 12 (twelve) hours. 08/22/12  Yes Kerrin Champagne, MD  polyethylene glycol [facility] / GLYCOLAX) packet Take 17 g by mouth daily as needed. For constipation   Yes Historical Provider, MD  silodosin (RAPAFLO) 8 MG CAPS capsule Take 8 mg by mouth daily.   Yes Historical Provider, MD  tiotropium (SPIRIVA) 18 MCG inhalation capsule Place 1 capsule (18 mcg total) into inhaler and inhale daily. 08/22/12  Yes Ripudeep Jenna Luo, MD  albuterol (PROVENTIL) (5 MG/ML) 0.5% nebulizer solution Take 0.5 mLs (2.5 mg total) by nebulization 2 (two) times daily. And q4hours PRN for wheezing and SOB 08/22/12   Ripudeep Jenna Luo, MD   No Known Allergies  FAMILY HISTORY:  History reviewed. No pertinent family history.  SOCIAL HISTORY:  reports that he quit smoking about 2 months ago. His smoking use included Cigarettes. He has a 50 pack-year smoking history. He has never used smokeless tobacco. He reports that he does not drink alcohol or use illicit drugs.  REVIEW OF SYSTEMS:  Unable to complete as pt is altered.  SUBJECTIVE:  RN reports multiple doses pain medications, continued abd pain despite medications, decreased  mental status.  VITAL SIGNS: Temp:  [97.4 F (36.3 C)-98.7 F (37.1 C)] 97.4 F (36.3 C) (02/06 1247) Pulse Rate:  [29-112] 99  (02/06 1300) Resp:  [11-26] 26  (02/06 1128) BP: (55-192)/(21-86) 70/32 mmHg (02/06 1300) SpO2:  [87 %-100 %] 100 % (02/06 1300) FiO2 (%):  [60 %-100 %] 100 % (02/06 1128) Weight:  [239 lb 13.8 oz (108.8 kg)] 239 lb 13.8 oz (108.8 kg) (02/06 0544)  HEMODYNAMICS: CVP:  [7 mmHg] 7 mmHg  VENTILATOR SETTINGS: Vent Mode:  [-] PRVC FiO2 (%):  [60 %-100 %] 100 % Set Rate:  [16 bmp-18 bmp] 18 bmp Vt Set:  [620 mL] 620 mL PEEP:  [5 cmH20] 5 cmH20 Plateau Pressure:  [27 cmH20-30 cmH20] 30 cmH20  INTAKE / OUTPUT: Intake/Output      02/05 0701 - 02/06 0700 02/06 0701 -  02/07 0700   P.O. 240    I.V. (mL/kg) 340 (3.1)    Total Intake(mL/kg) 580 (5.3)    Urine (mL/kg/hr) 3150 (1.2)    Total Output 3150    Net -2570         Stool Occurrence 1 x      PHYSICAL EXAMINATION: General:  Chronically ill elderly male, altered Neuro:  Sedate prior to intubation, minimally responsive HEENT:  Mm pink/moist Cardiovascular:  s1s2 distant tones, tachy Lungs:  resp's shallow, non-labored, lungs bilaterally coarse Abdomen:  Distended, tight, coffee ground emesis Musculoskeletal:  No acute deformities Skin:  Warm/dry, generalized edema, 2+ pitting  LABS:  Lab 10/23/12 1135 10/23/12 1100 10/23/12 0950 10/23/12 0845 10/23/12 0430 10/22/12 0550 10/21/12 1255 10/21/12 0528 10/20/12 1700 10/20/12 0345 10/16/12 2252  HGB -- 4.9* 5.6* -- 6.7* -- -- -- -- -- --  WBC -- 11.6* -- -- 13.6* -- 7.9 -- -- -- --  PLT -- 211 -- -- 262 -- 245 -- -- -- --  NA -- 135 -- -- 130* 131* -- -- -- -- --  K -- 5.1 -- -- 4.4 -- -- -- -- -- --  CL -- 86* -- -- 87* 90* -- -- -- -- --  CO2 -- 20 -- -- 37* 35* -- -- -- -- --  GLUCOSE -- 279* -- -- 243* 187* -- -- -- -- --  BUN -- 36* -- -- 33* 29* -- -- -- -- --  CREATININE -- 1.64* -- -- 1.20 1.28 -- -- -- -- --  CALCIUM -- 8.4 -- -- 8.5 8.8  -- -- -- -- --  MG -- 2.4 -- -- -- -- -- -- 1.8 -- --  PHOS -- 6.9* -- -- -- -- -- -- -- -- --  AST -- -- -- -- -- -- -- -- -- -- --  ALT -- -- -- -- -- -- -- -- -- -- --  ALKPHOS -- -- -- -- -- -- -- -- -- -- --  BILITOT -- -- -- -- -- -- -- -- -- -- --  PROT -- -- -- -- -- -- -- -- -- -- --  ALBUMIN -- -- -- -- -- -- -- -- -- -- --  APTT -- 24 -- -- -- -- -- -- -- -- --  INR -- 1.40 -- -- -- -- -- 1.07 -- 1.27 --  LATICACIDVEN -- -- -- -- -- -- -- -- -- -- --  TROPONINI -- <0.30 <0.30 -- -- -- -- -- -- -- --  PROCALCITON -- -- -- -- -- -- -- -- -- -- --  PROBNP -- -- -- -- -- -- -- -- -- -- --  O2SATVEN -- -- -- -- -- -- -- -- -- -- --  PHART 6.996* -- -- 7.426 -- -- -- -- -- -- 7.465*  PCO2ART 60.9* -- -- 40.3 -- -- -- -- -- -- 39.9  PO2ART 87.3 -- -- 86.7 -- -- -- -- -- -- 59.1*    Lab 10/23/12 0808 10/22/12 1727 10/22/12 1144 10/22/12 0735 10/21/12 1643  GLUCAP 321* 298* 160* 291* 265*    CXR: 2/6 ETT in good position, mild edema bilaterally, NGT needs to be advanced   Principal Problem:  *Retroperitoneal bleed Active Problems:  Acute respiratory failure with hypoxia  Hemorrhagic shock  Cardiac arrest, PEA - 30+ mins CPR 2/06  Hypoxic encephalopathy  Acute blood loss anemia  Diabetes mellitus type 2 with peripheral artery disease  COPD (chronic obstructive pulmonary  disease)  Fracture of tibia, distal, right, closed, surgery 08/17/12  Acute on chronic diastolic congestive heart failure  Cor pulmonale, PA pressures in the 70s by echo [id number]   ASSESSMENT / PLAN:  PULMONARY A: Acute Respiratory Failure - in setting of hemorrhagic shock  PAH - 1/21 ECHO with PA of 73 COPD   P:   -intubated after arrival to ICU, 8cc / kg -repeat ABG one hour post arrest -PRN BD's in setting of COPD -am PCXR  CARDIOVASCULAR A:  Hemorrhagic Shock Acute on Chronic CHF - 12/1 ECHO with EF 65-70%, grade 1 diastolic dysfunction HTN  - history of, followed by Dr. Donnie Aho Atrial  Tachycardia R Heart Failure PVD  P:  -appreciate Cardiology input -pressors to maintain MAP > 65 -hold diuresis, BP and rate controlling medications post CPR -Stress steroids -EKG reviewed post CPR -cycle enzymes  RENAL A:   Acute Renal Failure - in setting of hemorrhagic shock  Metabolic Acidosis -   P:   -trend Sr. CR. / Lytes -hold diuresis  -D5w with 3 amps bicarb at 125  GASTROINTESTINAL A:   Retroperitoneal Hematoma - acute hemorrhage noted on CT 2/6 that was not present 1/26 (had known L rectus muscle hematoma).  NEW RP very large bleed on 2/6 CT ABD.  BPH Vomiting  - presumed in setting of ileus, RP bleed  P:   -see heme -d/c flomax -OGT, LIS -PPI  HEMATOLOGIC A: Hemorrhagic Shock / Acute blood loss anemia - received 60mg  Protamine during CPR DVT - RUE DVT post PICC, was on lovenox for DVT then hemorrhagic shock  P:  -3 units O neg emergency release blood now -DIC panel, CBC, PT/INR post transfusion -h/h Q6 -repeat cbc, DIC panel, INR in am -no further heparin products -SCD's  INFECTIOUS A:   Aspiration Event - vomiting prior to intubation.  Noted emesis at cords per anesthesia.    P:   -empiric coverage for aspiration & GI source with zosyn -R fem Aline placed under maximum sterile technique.  L Fem TLC was placed during CPR and will need to replaced under sterile conditions.   ENDOCRINE A:   DM Obesity  P:   -SSI  NEUROLOGIC A:   Acute Encephalopathy, post anoxic.   Hypothermia  Chronic Pain - received large doses of narc's during admit  P:   -hold all sedation, allow to wake.   -allow permissive hypothermia without active rewarming measures -prolonged downtime with CPR, consider CT HEAD once stabilized to evaluate for ICH / prognosis   GOALS OF CARE Extensive discussion post CPR with wife at bedside.  She indicates she wants to continue aggressive medical care but would not want to repeat CPR if he arrests a second time.   Depending on patient progression, may need to revisit discussion regarding goals.  Wife indicates her biggest priority is for him not to suffer.     I have personally obtained a history, examined the patient, evaluated laboratory and imaging results, formulated the assessment and plan and placed orders.  CRITICAL CARE: The patient is critically ill with multiple organ systems failure and requires high complexity decision making for assessment and support, frequent evaluation and titration of therapies, application of advanced monitoring technologies and extensive interpretation of multiple databases. Critical Care Time devoted to patient care services described in this note is 45 mins    Billy Fischer, MD ; [facility] [phone].  After 5:30 PM or weekends, call [phone]  10/23/2012, 1:11 PM

## 2012-10-23 NOTE — Code Documentation (Signed)
Approximately 0930 - Called by Dr. Donna Bernard for Critical Care Consult.  Patient transferred to ICU.  ABG reviewed from this am.  Patient assessed and clinically did not match ABG results.  Noted to be lethargic, appeared air hungry.  Reviewed patient record.  Noted to have had large quantity of narcotics over past 72 hours in setting of abdominal pain.  Attempted Narcan x1 dose with minimal response.  Observed patient briefly.  Then noted to have episode of vomiting coffee ground emesis.  Turned to recovery position and suctioned orally.  Pt with decreased respiratory effort and concern for airway protection given altered mental status.  Anesthesia called to ICU for intubation - noted emesis at cords.  Intubated without difficulty. Attempted to place Aline per RT and unable to place radially.  Approximately one hour post intubation patient was noted to have rhythm change and was in PEA.  CPR initiated with ACLS protocol.  Emergency release blood initiated.  Central line placed femorally and aline for pressure monitoring.  Bicarb gtt, pressors, volume resuscitation.   CPR continued for approximately 20 minutes with ROSC.  Patient remains on maximum support.  Family updated on patients status.     All procedures performed under direct supervision of Dr. Sung Amabile and with ultrasound guidance.   Terry Brim, NP-C [facility] Pgr: [phone] or [phone]   Terry Fischer, MD ; [facility] [place] [phone].  After 5:30 PM or weekends, call [phone]

## 2012-10-23 NOTE — Progress Notes (Signed)
[id number] Earlene Plater, RN, BSN, CCM:  CHART REVIEWED AND UPDATED.  Next chart review due on [id number]. NO DISCHARGE NEEDS PRESENT AT THIS TIME.  PATIENT TRANSFERRED TO ICU AND INTUBATED THIS AM.   CASE MANAGEMENT [phone]

## 2012-10-23 NOTE — Progress Notes (Signed)
Anesthesia note: called to ICU for intubation.  Dr Leta Jungling informed.  Pt. Lethargic on 100% nonrebreathing mask in resp distress.   Hx, chart reviewed.  Vomited.  Patent running iv via pic.  Meds: etomidate 14 mg, succinylcholine 100 mg ivp with saline flush.  Atraumatic oral intubation DL once by Pamala Duffel CRNA rapid sequence with cricoid pressure.  Oropharynx, cords with emesis attending informed.  Cuff up, no. 8 taper guard ett.  Equal, clear bbs at 21 taped in.  Positive capnograph.  cxr called for.  Gretel Acre CRNA.

## 2012-10-23 NOTE — Progress Notes (Signed)
Received call back from NP K. Craige Cotta with new orders for pt.  Stat CT of abdomen and to call Attending MD with results of test when available.

## 2012-10-23 NOTE — Progress Notes (Signed)
Pt calling out in severe pain, unable to lay still, abdomen very distended and tight.  Wife states he has been in pain all night long.  Pt alert and oriented, Hr 90's, BP 121/72.  Spoke with Dr. Donna Bernard, orders received. Will continue to monitor.

## 2012-10-23 NOTE — Progress Notes (Signed)
CRITICAL VALUE ALERT  Critical value received:  Hgb  6.7  Date of notification:  10-23-2012  Time of notification:  0545 am  Critical value read back:yes  Nurse who received alert:  B. Clelia Croft, RN (took phone call for pt's RNNeysa Bonito)  MD notified (1st page):  Donnamarie Poag, NP  Time of first page:  0555 am  MD notified (2nd page):  Time of second page:  Responding MD:  Donnamarie Poag, NP  Time MD responded:  [phone] am

## 2012-10-23 NOTE — Progress Notes (Addendum)
[facility] PROGRESS NOTE  Terry [name] NFA:[id number] DOB: [birth year] DOA: 10/02/2012 PCP: No primary provider on file.  Brief narrative:  67 y/o obese Male with hx of HTN, DM, PVD, diastolic dysfunction, COPD with cor pulmonale, urinary retention on foley , chronic low back pain,recent right tibial fracture repair presented to ED with no urine output for 24 hrs and found to be hypoxic.  He was admitted for evaluation of hypoxia. VQ scan showed low probability for pulmonary embolism. Lower extremity doppler study negative for DVT. He was started on Lasix in ED for mild interstitial edema based on chest x-ray. In addition, creatinine was 1.65 on this admission. Lasix was temporarily discontinued due to development of hypotension and worsening kidney function.  Patient continued to experience abdominal pain and distention. Abdominal x-ray done on 01/24 showed possible constipation. Repeat abdominal x-ray showed possible colonic ileus and CT abdomen/pelvis done 01/26 showed concern for the same. Hospital stay prolonged with development of anasarca needing aggressive diuresis.  Pt developed worsening R.hip pain  Pm of 2/5 and hip xray was neg. A CT of the Abd.and pelvis 2/6 revealed a very large retroperitoneal hematoma(10x20cm)-new and pt developed hypotension this am 2/6 with a drop of hgb to 6.7(from 8.7on 2/4)  and he was transferred to ICU. Discussed with wife at bedside who states she wants everything done for pt including intubation if needed.  Assessment/Plan:  Large Retroperitoneal bleed with Hypotension -As discussed above in pt on lovenox for DVT and with increased R. Hip pain and acute worsening of anemia and hypotension -d/c lovenox, pain management -fluid resuscitation, transfuse PRBC, serial H/H and further transfuse as appropriate. -transfer to ICU for close monitoring -I have dicussed pt with surgery and per Dr Daphine Deutscher no indication for surgical intervention -I have consulted CCM  for further recs  Diastolic CHF, Acute on chronic With anasarca  Pro BNP on admission 5080 . A 2-D echo was done in December 2013 with normal ejection fraction and grade 1 diastolic dysfunction. However has cor pulmonale with severe PAH. -Pt was being aggressively diuresed with Lasix and spironolactone per cards  -with hypotension this am was bolused with IVF pending blood transfusion ordered as above given the  drop in hgb in setting of #1 - follow and diurese as BP tolerates   Abdominal distention, pain  Secondary to colonic ileus  -required enemas and aggressive bowel regimen. Reduced dose of narcotics. Now resolved.  was seen by [facility].   Active Problems:  Right upper extremity DVT  In proximal right axillary vein  Dr Elisabeth Pigeon spoke with PCCM on call following admission and recommendation was to take PICC line out and continue anticoagulation for next 3 months . Left UE PICC placed.  continuing lovenox per pharmacy.   Acute respiratory failure with hypoxia  -initially likely more in the setting of diastolic dysfn and core pulmonale  And improved with diuresis 2D echo reviewed as above  -now titrated to [place] from face mask and then to [facility] o2  Dr Donnie Aho following -he was seen by pulmonary and  Plan for outpt follow up with PFTs  - will obtain ABG now to reval as he appears more SOB  Hypertensive heart disease  -was on long acting Cardizem - 360 mg daily Imdur 60 mg daily, hydralazine scheduled and prn -Pain likely contributing to elevated blood pressures-improve pain control -2/5 -today 2/6 as above -hypotensive so will hold off antihypertensives, and further manage as above COPD (chronic obstructive pulmonary disease)  Continue nebs, supplemental oxygen and follow up on ABG as above  Urinary retention  Secondary to chronic urethral stricture and foley placed on recent hospitalization for leg surgery.  has good urine output . Follow with urology as outpt   UTI (lower urinary tract  infection)  Completed 7 day course with rocephin   Fracture of tibia, distal, right, closed  Stable . Continue pain meds and encouraged PT.   Bilateral leg pain  Seems to be chronic and now has painful LLE blisters. Continue dressing . Continue when necessary morphine, oxycontin Neurontin for pain control.  Patient c/o persistent rt knee and ankle pain. Had sx in the leg recently . x ray of rt knee and ankle shows no acute insult. . Could have possible gout with hx of it and now on diuretic. Elevated uric acid noted. treating with a course of prednisone.  The LLE pain and blisters seem new ( only 3 weeks per patient). 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 Unclear if this is new ischemia . He has feeble distal pulsation on doppler. -on 2/5 with increased right lower extremity pain, oxycodone added-ultram DC'ed -[facility] saw pt for further of possible peripheral arterial disease eval and no further recommendations added - worsening R. Leg pain/hip likely 2/2 to #1 Diabetes mellitus type 2 with complications  A1c 7.3  Continue sliding scale insulin   Acute on chronic Anemia  -2/2 TO #1, transfusing PRBC, see #1 above   Acute kidney injury  Secondary to UTI, urinary retention and Lasix  currently Resolved. Continue monitoring with hypotension as above  Hyponatremia  likely in the setting of lasix, resolved, follow   Obesity (BMI 30-39.9)  Nutrition consulted   Consults:  [facility] / Dr Donnie Aho [facility] for LLE pain and hx of PVD Pulmonary ( patient will follow as outpt for PFT) [facility]  CCM  Code Status: Full code  Family Communication: wife at bedside  Disposition Plan:  Transfer to ICU    HPI/Subjective: increased R. Hip/leg pain despite IV morphine this am, Denies chest pain.. BP dropped to 70s sbp after a  dose of dilaudid. C/o SOB. Alert and appropriate. Objective: Filed Vitals:   10/22/12 1535 10/22/12 2127 10/23/12 0544 10/23/12 0828  BP: 163/81 192/86 126/74 79/42  Pulse: 97 95 94 112  Temp:  97.7 F (36.5 C) 98.7 F (37.1 C) 98.4 F (36.9 C)  TempSrc:  Oral Oral Oral  Resp: 20 18 18 20   Height:      Weight:   108.8 kg (239 lb 13.8 oz)   SpO2: 98% 99% 94% 100%    Intake/Output Summary (Last 24 hours) at 10/23/12 0901 Last data filed at 10/22/12 2200  Gross per 24 hour  Intake    580 ml  Output   2200 ml  Net  -1620 ml   Filed Weights   10/21/12 1245 10/22/12 0717 10/23/12 0544  Weight: 113.172 kg (249 lb 8 oz) 112.2 kg (247 lb 5.7 oz) 108.8 kg (239 lb 13.8 oz)    Exam:  General: Elderly obese male lying in bed, in distress 2/2 to pain, no accessory muscle use Heent: no pallor, moist oral mucosa, JVD not appreciated  Cardiovascular: tachy, regular nl S1&S2, no murmurs, rub or gallop  Respiratory: decreased BS a base, no crackles aor wheezes  Abdomen: soft, NT,  more distended. BS+.  EXT: Warm, 2 + pitting edema b/l ( improving) , left leg bandaged with dressing clean and dry tender b/l LE with limited ROM of knee and ankle , scrotal edema , foley in place. CNS: Alert and appropriate, non focal   Data Reviewed: Basic Metabolic Panel:  Lab 10/23/12 9562 10/22/12 0550 10/21/12 0346 10/20/12 1700 10/20/12 0345 10/19/12 0500  NA 130* 131* 129* -- 130* 135  K 4.4 4.2 3.9 -- 3.8 3.7  CL 87* 90* 89* -- 90* 96  CO2 37* 35* 34* -- 34* 31  GLUCOSE 243* 187* 171* -- 145* 123*  BUN 33* 29* 24* -- 20 18  CREATININE 1.20 1.28 1.35 -- 1.17 1.15  CALCIUM 8.5 8.8 8.6 -- 8.5 8.4  MG -- -- -- 1.8 -- --  PHOS -- -- -- -- -- --   Liver Function Tests: No results found for this basename: AST:5,ALT:5,ALKPHOS:5,BILITOT:5,PROT:5,ALBUMIN:5 in the last 168 hours No results found for this basename: LIPASE:5,AMYLASE:5 in the last 168 hours No results found for this basename: AMMONIA:5 in  the last 168 hours CBC:  Lab 10/23/12 0430 10/21/12 1255 10/21/12 0346 10/20/12 1035 10/18/12 0440  WBC 13.6* 7.9 7.9 8.7 5.5  NEUTROABS -- -- -- -- --  HGB 6.7* 8.7* 8.1* 8.7* 8.5*  HCT 22.2* 29.0* 28.0* 29.4* 29.0*  MCV 81.6 82.4 82.6 81.4 81.2  PLT 262 245 264 236 216   Cardiac Enzymes: No results found for this basename: CKTOTAL:5,CKMB:5,CKMBINDEX:5,TROPONINI:5 in the last 168 hours BNP (last 3 results)  Basename 10/13/12 1403 10-19-12 2125 08/22/12 0456  PROBNP 2380.0* 5080.0* 5872.0*   CBG:  Lab 10/23/12 0808 10/22/12 1727 10/22/12 1144 10/22/12 0735 10/21/12 1643  GLUCAP 321* 298* 160* 291* 265*    No results found for this or any previous visit (from the past 240 hour(s)).   Studies: Ct Abdomen Pelvis Wo Contrast  10/23/2012  *RADIOLOGY REPORT*  Clinical Data: Decreased hemoglobin.  Back pain.  CT ABDOMEN AND PELVIS WITHOUT CONTRAST  Technique:  Multidetector CT imaging of the abdomen and pelvis was performed following the standard protocol without intravenous contrast.  Comparison: CT 10/12/2012  Findings: Interval development of a very large retroperitoneal fluid collection containing a fluid-fluid level with high density debris layering dependently.  This is most compatible with a very large hematoma and measures 10 x 20 cm.  There is also enlargement and hematoma in the right psoas muscle.  These findings were not present previously.  Left lower rectus muscle hematoma measures 7 x 8 cm and is unchanged from prior CT.  No significant free fluid.  Adynamic ileus with diffuse dilatation of the colon and a large amount of stool in the right colon.  This is similar to the prior study.  No small bowel obstruction.  Small pleural effusions bilaterally.  No focal liver lesions. Gallbladder pancreas and spleen normal.  Small bilateral renal calculi.  Mild fullness of the right renal collecting system due to the retroperitoneal hematoma. Extensive lumbar laminectomy and fusion.   Bilateral hip replacement.  IMPRESSION: Very large retroperitoneal hematoma measuring 10 x 20 cm.  This is new.  Stable left lower rectus muscle hematoma measuring 7 x 8 cm is unchanged  Critical Value/emergent results were called by telephone at the time of interpretation on 10/23/2012 at 0737 hours to Dr. Suanne Marker, who verbally acknowledged these results.   Original Report Authenticated By: Janeece Riggers, M.D.    Dg Hip 1 View Right  10/22/2012  *RADIOLOGY REPORT*  Clinical  Data: Right hip pain.  RIGHT HIP - 1 VIEW  Comparison: 10/13/2012 pelvic film.  Findings: 2 frontal views of the right hip obtained.  Evaluation limited by patient's habitus.  Prior right total hip replacement.  No obvious complication noted.  IMPRESSION: Limited exam without obvious complication surrounding the total right hip replacement.   Original Report Authenticated By: Lacy Duverney, M.D.     Scheduled Meds:    . antiseptic oral rinse  15 mL Mouth Rinse BID  . diltiazem  360 mg Oral Daily  . docusate sodium  100 mg Oral BID  . furosemide  80 mg Intravenous Q8H  . gabapentin  300 mg Oral TID  . hydrALAZINE  50 mg Oral Q8H  . insulin aspart  0-15 Units Subcutaneous TID WC  . isosorbide mononitrate  60 mg Oral Daily  . methocarbamol  500 mg Oral TID  . OxyCODONE  20 mg Oral Q12H  . polyethylene glycol  17 g Oral Daily  . predniSONE  50 mg Oral Q breakfast  . sodium chloride  10-40 mL Intracatheter Q12H  . spironolactone  50 mg Oral BID  . Tamsulosin HCl  0.4 mg Oral Daily  . tiotropium  18 mcg Inhalation Daily     Time spent: critical care time 60 minutes    Martavius Lusty C  [facility] Pager [phone]. If 8PM-8AM, please contact night-coverage at [email or web address], password [facility] 10/23/2012, 9:01 AM  LOS: 16 days

## 2012-10-23 NOTE — Progress Notes (Signed)
Pt increasingly agitated, still in severe pain.  BP 76/50 and monitor showed instances of SVT with a rate of 150 at times.  Oxygen sat remains 88-94.  Rapid response at bedside.  Pt to be transferred to step down.

## 2012-10-24 ENCOUNTER — Inpatient Hospital Stay (HOSPITAL_COMMUNITY): Payer: 59

## 2012-10-24 LAB — DIC (DISSEMINATED INTRAVASCULAR COAGULATION)PANEL
D-Dimer, Quant: 8.57 ug/mL-FEU — ABNORMAL HIGH (ref 0.00–0.48)
Fibrinogen: 313 mg/dL (ref 204–475)
INR: 1.51 — ABNORMAL HIGH (ref 0.00–1.49)
Prothrombin Time: 17.8 seconds — ABNORMAL HIGH (ref 11.6–15.2)
aPTT: 36 seconds (ref 24–37)

## 2012-10-24 LAB — BASIC METABOLIC PANEL
Calcium: 7.1 mg/dL — ABNORMAL LOW (ref 8.4–10.5)
GFR calc non Af Amer: 32 mL/min — ABNORMAL LOW (ref >90)
Potassium: 4.9 mEq/L (ref 3.5–5.1)
Sodium: 130 mEq/L — ABNORMAL LOW (ref 135–145)

## 2012-10-24 LAB — HEPATIC FUNCTION PANEL
Albumin: 1.8 g/dL — ABNORMAL LOW (ref 3.5–5.2)
Total Protein: 4.7 g/dL — ABNORMAL LOW (ref 6.0–8.3)

## 2012-10-24 LAB — GLUCOSE, CAPILLARY
Glucose-Capillary: 137 mg/dL — ABNORMAL HIGH (ref 70–99)
Glucose-Capillary: 200 mg/dL — ABNORMAL HIGH (ref 70–99)
Glucose-Capillary: 236 mg/dL — ABNORMAL HIGH (ref 70–99)
Glucose-Capillary: 255 mg/dL — ABNORMAL HIGH (ref 70–99)

## 2012-10-24 LAB — CBC
MCHC: 33.8 g/dL (ref 30.0–36.0)
Platelets: 153 10*3/uL (ref 150–400)
RDW: 19.7 % — ABNORMAL HIGH (ref 11.5–15.5)
WBC: 25.1 10*3/uL — ABNORMAL HIGH (ref 4.0–10.5)

## 2012-10-24 LAB — HEMOGLOBIN AND HEMATOCRIT, BLOOD
HCT: 22.5 % — ABNORMAL LOW (ref 39.0–52.0)
Hemoglobin: 7.4 g/dL — ABNORMAL LOW (ref 13.0–17.0)

## 2012-10-24 LAB — BLOOD GAS, ARTERIAL
Acid-Base Excess: 8.8 mmol/L — ABNORMAL HIGH (ref 0.0–2.0)
Bicarbonate: 32 mEq/L — ABNORMAL HIGH (ref 20.0–24.0)
FIO2: 0.5 %
MECHVT: 620 mL
TCO2: 28.1 mmol/L (ref 0–100)
pCO2 arterial: 37.9 mmHg (ref 35.0–45.0)
pO2, Arterial: 62.7 mmHg — ABNORMAL LOW (ref 80.0–100.0)

## 2012-10-24 LAB — PREPARE RBC (CROSSMATCH)

## 2012-10-24 LAB — MAGNESIUM: Magnesium: 1.9 mg/dL (ref 1.5–2.5)

## 2012-10-24 LAB — PHOSPHORUS: Phosphorus: 5.1 mg/dL — ABNORMAL HIGH (ref 2.3–4.6)

## 2012-10-24 MED ORDER — FENTANYL CITRATE 0.05 MG/ML IJ SOLN
25.0000 ug | INTRAMUSCULAR | Status: DC | PRN
  Administered 2012-10-24: 100 ug via INTRAVENOUS
  Filled 2012-10-24: qty 2

## 2012-10-24 MED ORDER — FUROSEMIDE 10 MG/ML IJ SOLN
120.0000 mg | Freq: Once | INTRAVENOUS | Status: AC
  Administered 2012-10-24: 120 mg via INTRAVENOUS
  Filled 2012-10-24 (×2): qty 12

## 2012-10-24 NOTE — Progress Notes (Signed)
Counselor entered room to check ion on pt's wife and offer support.  Pt's wife became tearful and was able to rely on support form counselor and family.  Counselor let pt's wife know that counselor was available to offer support when needed.  Pt's wife stated feeling adequately  supported by family at the moment.  Claudell Kyle Counselor Intern [employer]

## 2012-10-24 NOTE — Progress Notes (Signed)
Subjective:  Events since yesterday visit reviewed. Spoke with CCM extender yesterday afternoon after code.   Objective:  Vital Signs in the last 24 hours: BP 86/54  Pulse 102  Temp 98.4 F (36.9 C) (Oral)  Resp 21  Ht 6' (1.829 m)  Wt 115.8 kg (255 lb 4.7 oz)  BMI 34.62 kg/m2  SpO2 99%  Physical Exam: Black male in NAD intubated and sedated Lungs Clear Heart regular rhythm no S3 2+ edema Intake/Output from previous day: 02/06 0701 - 02/07 0700 In: 7574.7 [I.V.:6382.2; Blood:1050; NG/GT:30; IV Piggyback:112.5] Out: 376 [Urine:26; Emesis/NG output:350]  Weight Filed Weights   10/22/12 0717 10/23/12 0544 10/24/12 0600  Weight: 112.2 kg (247 lb 5.7 oz) 108.8 kg (239 lb 13.8 oz) 115.8 kg (255 lb 4.7 oz)    Lab Results: Basic Metabolic Panel:  Basename 10/24/12 0325 10/23/12 1339  NA 130* 133*  K 4.9 4.2  CL 84* 88*  CO2 29 18*  GLUCOSE 257* 251*  BUN 44* 37*  CREATININE 2.02* 1.61*    CBC:  Basename 10/24/12 0325 10/23/12 2025  WBC 25.1* 23.4*  NEUTROABS -- --  HGB 7.8* 9.2*  HCT 23.1* 27.6*  MCV 82.5 83.1  PLT 153152 155155    BNP    Component Value Date/Time   PROBNP 2380.0* 10/13/2012 1403    Telemetry: Sinus rhythm   Assessment/Plan:  1. Acute retroperitoneal hematoma with blood loss anemia  2. Cor pulmonale with significant underlying lung disease 3. Diastolic CHF 4. Atrial tachycardia  Rec:  Spoke with wife at bedside.  She is thinking of withdrawing support in am if no improvement.  She wants him to be at peace and comfortable.  He has had a lot of issues with chronic pain.   Darden Palmer  MD [facility]  10/24/2012, 1:31 PM

## 2012-10-24 NOTE — Progress Notes (Signed)
At beginning of shift, dayshift RN explained to me about the patient's pain in his right Hip/leg and how it had not been relieved with pain meds that had been ordered for him during the day.  The pt was receiving Morphine 2 mg IV and Oxycodone IR  5mg  PO.  His last dose of Morphine and Oxy IR according to RN report and pt's MAR was at 1745.  Dayshift RN explained to me that the MD did not want to prescribe a lot of narcotic pain meds due to pt's hx of ileus.  Upon meeting the pt and his wife the pt was crying out in pain and unable to lie still in the bed.  The dayshift RN informed the pt and pt's wife when his next pain med could be given and the reasoning behind why the MD was not prescribing the pt many pain meds.  I also told the pt and pt's wife that I would contact the NP on call for Dr. Suanne Marker to see if there was anything we could do to help relieve his pain.  At that point, pt was complaining of pain only in his right leg/hip which he said was a 12/10 on the 0-10 pain scale.  Pt alert and oriented x 4.  I contacted Donnamarie Poag, NP on call and informed her of pt's pain.  Received new orders for one time dose of 2 mg Morphine to be given now (even though it would be about 30-45 mins early, since last dose given at 1745 that day).  Will continue to monitor pt.

## 2012-10-24 NOTE — Progress Notes (Signed)
Received critical value alert that pt's Hgb was 6.7.  Notified Donnamarie Poag, NP of Hgb and pt's persisting pain/need for stronger pain med.  Donnamarie Poag called me back with orders for Stat CT of abdomen and to give 4 mg Morphine IV for pain relief.  Gave pt his a.m. meds which consisted of IV Lasix, Hydralazine PO and Morphine 4 mg IV.  Pt continues to rate pain 10/10 and mostly on his right side of back, tachypneic when pain is at its worst. Per pt's wife, pt in and out of sleep due to exhaustion   Notified my nurse tech that I would need help transporting the pt down to CT this a.m. Will continue to monitor pt.

## 2012-10-24 NOTE — Clinical Social Work Note (Signed)
Clinical Social Work Department BRIEF PSYCHOSOCIAL ASSESSMENT 10/24/2012  Patient:  Terry [name], Terry [name]     Account Number:  [id number]     Admit date:  10/19/2012  Clinical Social Worker:  Jodelle Red  Date/Time:  10/23/2012 11:00 AM  Referred by:  CSW  Date Referred:  10/23/2012 Referred for  Crisis Intervention   Other Referral:   SUPPORT DURING CODE  ADJUSTMENT TO ILLNESS   Interview type:  Family Other interview type:   CHART REVIEW, DISCUSSION WITH RN AND MEDICAL TEAM    PSYCHOSOCIAL DATA Living Status:  WIFE Admitted from facility:   Level of care:   Primary support name:  Dale Ribeiro Primary support relationship to patient:  SPOUSE Degree of support available:   good from wife and extended family    CURRENT CONCERNS Current Concerns  Adjustment to Illness   Other Concerns:    SOCIAL WORK ASSESSMENT / PLAN CSW has worked with this Pt in the past as he was admitted last fall to SDU. Both wife and Pt were very open to additional support/resources from CSW in the past. They do not have family in the area and both Pt and wife agreed for Pt to go to a SNF after last admission. Pt was there one night per wife and did not follow through on follow up.  CSW spent extensive amount of time supporting wife yesterday when Pt was transferred to ICU before, during and after code.  Per wife, she has several good friends through work that came to support her. Pt and wife have no family locally, but they came yesterday and here supporting her.  Wife, Bonita Quin is coping adequately, but is very anxious. She does report she is getting her questions answered. Chaplain also following closely.   Assessment/plan status:  Psychosocial Support/Ongoing Assessment of Needs Other assessment/ plan:   follow for resources and support   Information/referral to community resources:    PATIENT'S/FAMILY'S RESPONSE TO PLAN OF CARE: CSW will follow closely for support, guidance and  resources. Wife appreciative and is open to much support.   Doreen Salvage, LCSW ICU/Stepdown Clinical Social Worker [facility] Cell [phone] Hours 8am-1200pm M-F

## 2012-10-24 NOTE — Progress Notes (Signed)
Nutrition Brief Note  Body mass index is 34.62 kg/(m^2). Patient meets criteria for Obese based on current BMI.   Current diet order is NPO. Labs and medications reviewed.   67 y.o. AAM with hx of DM, PVD, HTN, Obesity, and CHF admitted on 1/21. Hospital course was complicated by fecal impactation, colonic ileus, and rectal muscle hematoma. CT on 2/6 noted new large retroperitoneal hematoma. Pt was intubated 2/6 with PEA arrest post intubation. Pt has new retroperitoneal bleed and hemorrhagic shock, acute respiratory failure, acute renal failure. Spoke with RN who stated care is to be withdrawn within the next 24 hrs, no nutrition services needed.  No nutrition interventions warranted at this time. If nutrition issues arise, please consult RD.   Ian Malkin RD, LDN Inpatient Clinical Dietitian Pager: [phone] After Hours Pager: [phone]

## 2012-10-24 NOTE — Progress Notes (Signed)
Anemia; CT abdomen with retroperitoneal bleeding;   Transfuse 2U of PRBC;

## 2012-10-24 NOTE — Progress Notes (Signed)
Pt called out for his pain pill, Oxycodone IR 5mg .  Pt still in pain, with right side lower back pain increasing.  Pt's breathing tachypneic and respirations shallow. Pt unable to lie still at times, kept shifting his weight in the bed to try and get comfortable. Spoke with NP Donnamarie Poag earlier in the shift when she was up on the 4th floor working with another pt on [address].  I asked her if it would be possible to switch the pain med from Morphine to something stronger. She suggested that we could do Dilaudid if necessary.  I informed the pt and his wife about this and they were in agreement with getting something else for the pt's pain if the pill didn't work bc he was in pain all night long, according to pt's wife. Will continue to monitor the pt.

## 2012-10-24 NOTE — Progress Notes (Signed)
Pt now complaining of right sided back pain and continuing right leg pain. Pain score 10/10. Pt also expressing that Morphine 2mg  IV only brought his pain down from a 10/10 to 9/10. Gave the pt Morphine 2mg  IV again (at 0150 am) and instructed pt that we could give his Oxycodone IR 5 mg in 20-30 mins if his pain persisted. I informed pt to call if his pain got worse and to let me know when he wanted his Oxy IR.  Pt and pt's wife in agreement. Will continue to monitor pt.

## 2012-10-24 NOTE — Progress Notes (Addendum)
PULMONARY  / CRITICAL CARE MEDICINE  Name: Terry [name] MRN: [id number] DOB: 26-Sep-1945    ADMISSION DATE:  09/23/2012 CONSULTATION DATE:  10/22/12  REFERRING MD :  Dr. Suanne Marker  CHIEF COMPLAINT:  Retroperitoneal Hematoma, Decreased mental status  BRIEF PATIENT DESCRIPTION: 67 y/o AAM with PMH of DM, PVD, HTN, Obesity, CHF admitted on 1/21 with complaints of no urinary output after foley change in Urology office on 1/20.  On eval, was noted to have decompensated CHF and hypoxia requiring admit.  Recent multiple admits in last 2 months after tib/fib fx with complications of urinary retention, acute renal failure, ileus, UTI, LE wounds.  Current hospital course complicated by fecal impaction, colonic ileus, rectal muscle hematoma, atrial tach, and RUE DVT (after PICC).  PICC removed and placed on full dose lovenox.  2/6 due to continued complaints of abd pain & decreased hgb, repeat CT ABD obtained which noted new large retroperitoneal hematoma (10x20).  PCCM consulted for acute decompensation / RP bleed.    SIGNIFICANT EVENTS / STUDIES:  1/21 - Admit with hypoxia, decompensated CHF 1/26 - CT ABD/Pelvis with small effusions, R colon distention concerning for fecal impaction, colonic ileus, no SBO, L rectus muscle hematoma  1/28 - UE Doppler>>>DVT RUE POSITIVE 1/30 - LE Doppler>>> neg 2/6  - CT ABD/Pelvis with new very large RP hematoma 10x20 cm, stable left lower rectus muscle hematoma 7x8.  Tx to ICU / PCCM.  Intubated.  PEA arrest post intubation.    LINES / TUBES: 2/6 OETT>>> 2/6 R Fem Aline>>> 2/6 L Fem TLC>>>   CULTURES: 2/6 MRSA PCR>>>neg  ANTIBIOTICS: Zosyn 2/6 (asp & GI)>>>    SUBJECTIVE:  Remains on pressors VITAL SIGNS: Temp:  [97 F (36.1 C)-98.7 F (37.1 C)] 98.4 F (36.9 C) (02/07 0800) Pulse Rate:  [31-133] 97  (02/07 0835) Resp:  [17-40] 20  (02/07 0835) BP: (41-173)/(11-95) 130/70 mmHg (02/07 0835) SpO2:  [48 %-100 %] 100 % (02/07 0835) FiO2 (%):  [50 %-100 %] 50  % (02/07 0835) Weight:  [115.8 kg (255 lb 4.7 oz)] 115.8 kg (255 lb 4.7 oz) (02/07 0600)  HEMODYNAMICS: CVP:  [7 mmHg-10 mmHg] 10 mmHg  VENTILATOR SETTINGS: Vent Mode:  [-] PRVC FiO2 (%):  [50 %-100 %] 50 % Set Rate:  [16 bmp-18 bmp] 18 bmp Vt Set:  [620 mL] 620 mL PEEP:  [5 cmH20] 5 cmH20 Plateau Pressure:  [25 cmH20-30 cmH20] 27 cmH20  INTAKE / OUTPUT: Intake/Output      02/06 0701 - 02/07 0700 02/07 0701 - 02/08 0700   P.O.     I.V. (mL/kg) 6362.2 (54.9)    Blood 1050 162.5   NG/GT 30    IV Piggyback 112.5    Total Intake(mL/kg) 7554.7 (65.2) 162.5 (1.4)   Urine (mL/kg/hr) 26 (0)    Emesis/NG output 350    Total Output 376    Net +7178.7 +162.5          PHYSICAL EXAMINATION: General:  Chronically ill elderly male, Neuro:  Non responsive HEENT:  Mm pink/moist Cardiovascular:  s1s2 distant tones, tachy Lungs:  resp's shallow, non-labored, lungs bilaterally coarse Abdomen:  Distended, tight,  Musculoskeletal:  No acute deformities Skin:  Warm/dry, generalized edema, 2+ pitting  LABS:  Lab 10/24/12 0325 10/23/12 2025 10/23/12 1500 10/23/12 1339 10/23/12 1330 10/23/12 1135 10/23/12 1100 10/23/12 0845 10/20/12 1700  HGB 7.8* 9.2* 8.9* -- -- -- -- -- --  WBC 25.1* 23.4* -- -- -- -- 11.6* -- --  PLT  [id number] -- -- -- -- 211 -- --  NA 130* -- -- 133* -- -- 135 -- --  K 4.9 -- -- 4.2 -- -- -- -- --  CL 84* -- -- 88* -- -- 86* -- --  CO2 29 -- -- 18* -- -- 20 -- --  GLUCOSE 257* -- -- 251* -- -- 279* -- --  BUN 44* -- -- 37* -- -- 36* -- --  CREATININE 2.02* -- -- 1.61* -- -- 1.64* -- --  CALCIUM 7.1* -- -- 7.4* -- -- 8.4 -- --  MG 1.9 -- -- -- -- -- 2.4 -- 1.8  PHOS 5.1* -- -- -- -- -- 6.9* -- --  AST 882* -- -- -- -- -- -- -- --  ALT 596* -- -- -- -- -- -- -- --  ALKPHOS 56 -- -- -- -- -- -- -- --  BILITOT 0.6 -- -- -- -- -- -- -- --  PROT 4.7* -- -- -- -- -- -- -- --  ALBUMIN 1.8* -- -- -- -- -- -- -- --  APTT 36 38* -- -- -- -- 24 -- --  INR 1.51*  1.471.48 -- -- -- -- 1.40 -- --  LATICACIDVEN -- -- -- -- -- -- -- -- --  TROPONINI -- >20.00* 4.83* -- -- -- <0.30 -- --  PROCALCITON -- -- -- -- -- -- -- -- --  PROBNP -- -- -- -- -- -- -- -- --  O2SATVEN -- -- -- -- -- -- -- -- --  PHART -- -- -- -- 7.224* 6.996* -- 7.426 --  PCO2ART -- -- -- -- 43.8 60.9* -- 40.3 --  PO2ART -- -- -- -- 111.0* 87.3 -- 86.7 --    Lab 10/24/12 0341 10/23/12 2356 10/23/12 2042 10/23/12 1514 10/23/12 1322  GLUCAP 236* 255* 286* 216* 261*    CXR: Ct Abdomen Pelvis Wo Contrast  10/23/2012  *RADIOLOGY REPORT*  Clinical Data: Decreased hemoglobin.  Back pain.  CT ABDOMEN AND PELVIS WITHOUT CONTRAST  Technique:  Multidetector CT imaging of the abdomen and pelvis was performed following the standard protocol without intravenous contrast.  Comparison: CT 10/12/2012  Findings: Interval development of a very large retroperitoneal fluid collection containing a fluid-fluid level with high density debris layering dependently.  This is most compatible with a very large hematoma and measures 10 x 20 cm.  There is also enlargement and hematoma in the right psoas muscle.  These findings were not present previously.  Left lower rectus muscle hematoma measures 7 x 8 cm and is unchanged from prior CT.  No significant free fluid.  Adynamic ileus with diffuse dilatation of the colon and a large amount of stool in the right colon.  This is similar to the prior study.  No small bowel obstruction.  Small pleural effusions bilaterally.  No focal liver lesions. Gallbladder pancreas and spleen normal.  Small bilateral renal calculi.  Mild fullness of the right renal collecting system due to the retroperitoneal hematoma. Extensive lumbar laminectomy and fusion.  Bilateral hip replacement.  IMPRESSION: Very large retroperitoneal hematoma measuring 10 x 20 cm.  This is new.  Stable left lower rectus muscle hematoma measuring 7 x 8 cm is unchanged  Critical Value/emergent results were called by  telephone at the time of interpretation on 10/23/2012 at 0737 hours to Dr. Suanne Marker, who verbally acknowledged these results.   Original Report Authenticated By: Janeece Riggers, M.D.     Port 1 View  10/24/2012  *RADIOLOGY REPORT*  Clinical Data: Interstitial edema.  Endotracheal tube placement.  PORTABLE CHEST - 1 VIEW  Comparison: 10/23/2012  Findings: Endotracheal tube, central line, and NG tube are in place.  There is gaseous distention of the stomach.  There is increased bilateral pulmonary edema with a new effusion at the right lung base.  I suspect there is a small left effusion as well.  IMPRESSION: Increased pulmonary edema with new bilateral effusions. Gaseous distention of the stomach.   Original Report Authenticated By: Francene Boyers, M.D.      Principal Problem:  *Retroperitoneal bleed Active Problems:  Fracture of tibia, distal, right, closed, surgery 08/17/12  Diabetes mellitus type 2 with peripheral artery disease  COPD (chronic obstructive pulmonary disease)  Acute on chronic diastolic congestive heart failure  Acute respiratory failure with hypoxia  Cor pulmonale, PA pressures in the 70s by echo [id number]  Hemorrhagic shock  Cardiac arrest, PEA - 30+ mins CPR 2/06  Hypoxic encephalopathy  Acute blood loss anemia   ASSESSMENT / PLAN:  PULMONARY A: Acute Respiratory Failure - in setting of hemorrhagic shock  PAH - 1/21 ECHO with PA of 73 COPD   P:   -intubated after arrival to ICU, 8cc / kg -PRN BD's in setting of COPD -Follow  PCXR  CARDIOVASCULAR A:  Hemorrhagic Shock Acute on Chronic CHF - 12/1 ECHO with EF 65-70%, grade 1 diastolic dysfunction HTN  - history of, followed by Dr. Donnie Aho Atrial Tachycardia R Heart Failure PVD  P:  -appreciate Cardiology input -pressors to maintain MAP > 65 -hold diuresis, BP and rate controlling medications post CPR -Stress steroids -EKG reviewed post CPR -cycle enzymes, trop >20 post cpr  RENAL Lab Results  Component  Value Date   CREATININE 2.02* 10/24/2012   CREATININE 1.61* 10/23/2012   CREATININE 1.64* 10/23/2012    A:   Acute Renal Failure - in setting of hemorrhagic shock  Metabolic Acidosis -   P:   -trend Sr. CR. / Lytes -hold diuresis  -D5w with 3 amps bicarb at 125, dc 2-7 2-7 try one dose of lasix for oliguria.   GASTROINTESTINAL A:   Retroperitoneal Hematoma - acute hemorrhage noted on CT 2/6 that was not present 1/26 (had known L rectus muscle hematoma).  NEW RP very large bleed on 2/6 CT ABD.  BPH Vomiting  - presumed in setting of ileus, RP bleed  P:   -see heme -d/c flomax -OGT, LIS -PPI  HEMATOLOGIC A: Hemorrhagic Shock / Acute blood loss anemia - received 60mg  Protamine during CPR DVT - RUE DVT post PICC, was on lovenox for DVT then hemorrhagic shock Lab Results  Component Value Date   INR 1.51* 10/24/2012   INR 1.47 10/23/2012   INR 1.48 10/23/2012   INR 1.40 10/23/2012    P:  -transfuse to hgb 7.0 -DIC panel, CBC, PT/INR  -h/h Q6 -no further heparin products -SCD's  INFECTIOUS A:   Aspiration Event - vomiting prior to intubation.  Noted emesis at cords per anesthesia.    P:   -empiric coverage for aspiration & GI source with zosyn -R fem Aline placed under maximum sterile technique.  L Fem TLC was placed during CPR and will need to replaced under sterile conditions.   ENDOCRINE A:   DM Obesity  P:   -SSI  NEUROLOGIC A:   Acute Encephalopathy, post anoxic.   Hypothermia  Chronic Pain - received large doses of narc's during admit  P:   -hold all sedation, allow to wake.   -  allow permissive hypothermia without active rewarming measures -prolonged downtime with CPR, consider CT HEAD once stabilized to evaluate for ICH / prognosis. Unable to tolerate lying flat.   GOALS OF CARE Extensive discussion post CPR with wife at bedside.  She indicates she wants to continue aggressive medical care but would not want to repeat CPR if he arrests a second time.   Depending on patient progression, may need to revisit discussion regarding goals.  Wife indicates her biggest priority is for him not to suffer. She is likely to request withdrawal of vent and pressors in AM 2/08   I have interviewed and examined the patient and reviewed the database. I have formulated the assessment and plan as reflected in the note above with amendments made by me. 40 mins of direct critical care time provided  Billy Fischer, MD;  PCCM service; Mobile [phone]

## 2012-10-24 NOTE — Progress Notes (Signed)
Counselor checked in on wife and family. Pt's wife discussed the decision to remove life support and being at peace with the choice as it is in pt's best interests.  Counselor offered support. Pt's wife was supported by family, as well.  Alvia Grove Counseling Intern [employer]

## 2012-10-24 NOTE — Progress Notes (Signed)
Patient was Catholic but has not been to a [employer] in some time. Spouse requested Catholic priest to say prayers and anoint patient. I called [facility] to request a priest since they are our emergency contact for February. I left a message requesting the priest per the spouse and family request. In case a priest doesn't make it, I also did a ritual of anointing from the Anglican/Episcopal tradition per spouses request.  Family and staff gathered around the patient and prayers were offered.

## 2012-10-24 NOTE — Progress Notes (Signed)
PT Cancellation Note  Patient Details Name: Terry [name] MRN: [id number] DOB: Jul 22, 1946   Cancelled Treatment:    Reason Eval/Treat Not Completed: Medical issues which prohibited therapy  Pt now in ICU on vent.  Will discontinue PT. Please re-reorder when pt appropriate to resume PT. Thank you   Donnetta Hail 10/24/2012, 9:02 AM

## 2012-10-25 ENCOUNTER — Inpatient Hospital Stay (HOSPITAL_COMMUNITY): Payer: 59

## 2012-10-27 LAB — TYPE AND SCREEN
Antibody Screen: NEGATIVE
Unit division: 0
Unit division: 0
Unit division: 0

## 2012-11-04 ENCOUNTER — Institutional Professional Consult (permissible substitution): Payer: 59 | Admitting: Pulmonary Disease

## 2012-11-15 NOTE — Progress Notes (Signed)
@   1610 Called to pt bedside by RN for terminal extubation per telephone order by Dr Deterding.  Order not entered into chart due to system downtime.  Pt removed from vent and extubated to room air with pt family and 2 RNs present at bedside.

## 2012-11-15 NOTE — Progress Notes (Signed)
Pt expired. No spontaneous heart sounds noted. Family at the bedside. Verified by 2 RN'S. E-Link MD aware.  Conley Rolls, RN, Wonda Horner RN

## 2012-11-15 DEATH — deceased

## 2012-12-03 NOTE — Discharge Summary (Signed)
DEATH SUMMARY  DATE OF ADMISSION:  November 05, 2012  DATE OF DISCHARGE/DEATH:  22-Nov-2012  ADMISSION DIAGNOSES:   Principal Problem:  *Acute respiratory failure with hypoxia  Active Problems:  Fracture of tibia, distal, right, closed  Acute kidney injury  COPD (chronic obstructive pulmonary disease)  Diastolic CHF, chronic  Chronic urethral stricture  Urinary retention  UTI (lower urinary tract infection)    DISCHARGE DIAGNOSES:   Principal Problem:   Retroperitoneal bleed Active Problems:   Acute respiratory failure with hypoxia   Hemorrhagic shock   Cardiac arrest, PEA - 30+ mins CPR 2/06   Hypoxic encephalopathy   Acute blood loss anemia   Diabetes mellitus type 2 with peripheral artery disease   COPD (chronic obstructive pulmonary disease)   Fracture of tibia, distal, right, closed, surgery 08/17/12   Acute on chronic diastolic congestive heart failure   Cor pulmonale, PA pressures in the 70s by echo [id number]    PRESENTATION:  He was admitted on 11/05/2012 to the [facility] service with the following HPI and the above admission diagnoses:  67 yo male had his foley changed by urology yesteereday and has no uop since then in his bag. Was hospitalized last month and had surgery on his rt tib/fib after having a fracture. It was recommended he go home with oxygen at that time but he did not. ED changed out his catheter and has gotten about 800 cc uop clear urine. However also found to be hypoxic in the ED with generalized swelling. He chronically has le swelling but it has been worse in the last couple of days and has also had swelling in his pannus and face. No fevers. No cp. No sob. No n/v. No cough.     HOSPITAL COURSE:   He was seen in consultation by Dr Bosie Clos 1/26 for constipation and abdominal pain. He was seen in consultation by Dr Katrinka Blazing 1/30 for edema, right ventricular failure. He was seen by Dr Delton Coombes 1/31 for hypoxemia. He was seen by Dr Allyson Sabal on 2/04 re: possible PVD in LEs. Please  refer to these physicians' respective consultation notes for more details regarding their impressions and recommendations.   On 10/23/12 he was transferred to ICU for hemorrhagic shock due to retroperitoneal hemorrhage and PCCM service was consulted and assumed his care. Shortly thereafter he was intubated for acute respiratory arrest. Subsequently he suffered PEA cardiac arrest and underwent approx 20 minutes of ACLS  On the following day he remained critically ill and it was deemed that his prognosis for functional recovery was poor. The following conversation was documented:  GOALS OF CARE Extensive discussion post CPR with wife at bedside.  She indicates she wants to continue aggressive medical care but would not want to repeat CPR if he arrests a second time.  Depending on patient progression, may need to revisit discussion regarding goals.  Wife indicates her biggest priority is for him not to suffer. She is likely to request withdrawal of vent and pressors in AM 2/08   Later in the afternoon of 22-Nov-2012, the patient's wife requested that life-sustaining therapies be discontinued. Full comfort care was provided and mechanical ventilatory support was discontinued. He passed away peacefully shortly thereafter. No autopsy was performed.  During his hospitalization, the following events were documented:  SIGNIFICANT EVENTS / STUDIES:  2022/11/05 - Admit with hypoxia, decompensated CHF  1/26 - CT ABD/Pelvis with small effusions, R colon distention concerning for fecal impaction, colonic ileus, no SBO, L rectus muscle hematoma  1/28 - UE  Doppler>>>DVT RUE POSITIVE  1/30 - LE Doppler>>> neg  2/6 - CT ABD/Pelvis with new very large RP hematoma 10x20 cm, stable left lower rectus muscle hematoma 7x8. Tx to ICU / PCCM. Intubated. PEA arrest post intubation.   During his hospitalization, the following procedures were performed:  LINES / TUBES:  2/6 ETT 2/6 R Fem A-line 2/6 L Fem TLC  Billy Fischer, MD;   PCCM service; Mobile [phone]

## 2014-09-19 IMAGING — CT CT ANGIO CHEST
2 of 6 series · 19 of 36 positions shown · IV contrast (OMNIPAQUE)
Comparison: 10/18/2010

CLINICAL DATA: Pulmonary arterial hypertension of unknown etiology.
Evaluate for pulmonary embolus.

CT ANGIOGRAPHY CHEST
TECHNIQUE: Multidetector CT imaging of the chest using the
standard protocol during bolus administration of intravenous
contrast. Multiplanar reconstructed images including MIPs were
obtained and reviewed to evaluate the vascular anatomy.
Contrast: 100mL OMNIPAQUE IOHEXOL 350 MG/ML SOLN

[Series 6: pe thins @ 1mm · axial · 0.65mm/px · z∈[-266,-18]mm · 18 of 276 slices shown]
[im 14/276  lung]
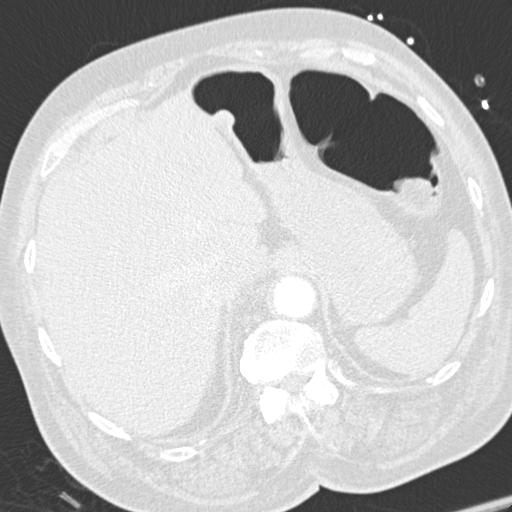
[im 28/276  mediastinal]
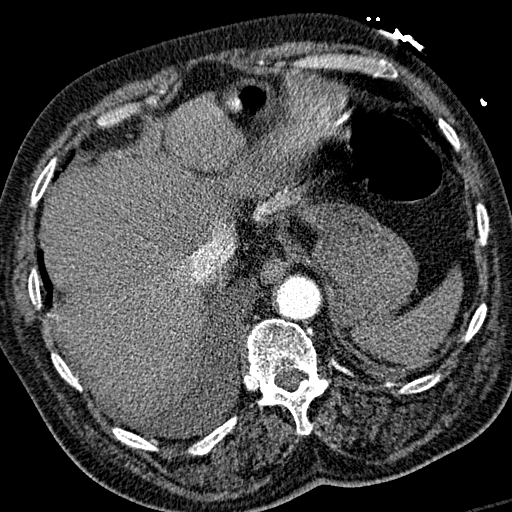
[im 42/276  lung]
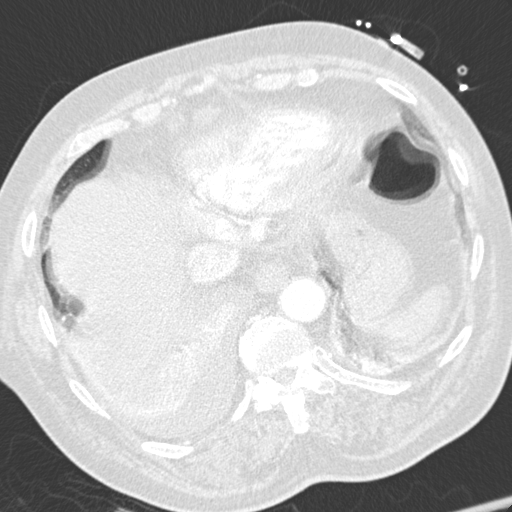
[im 56/276  mediastinal]
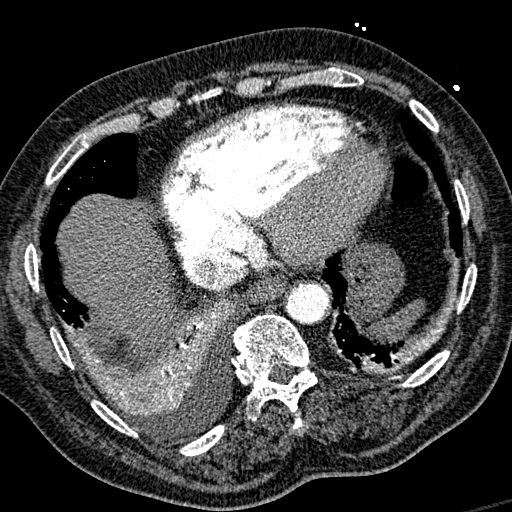
[im 69/276  lung]
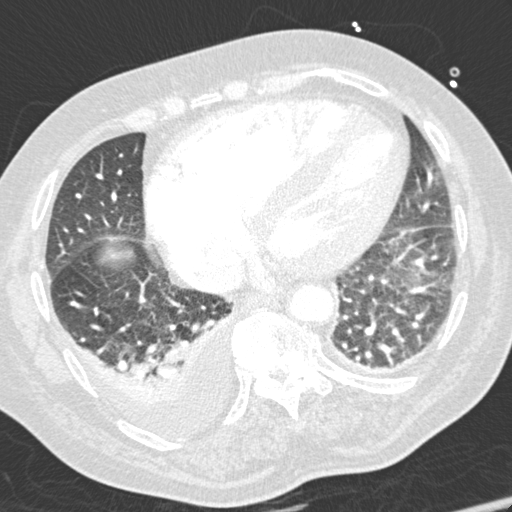
[im 83/276  mediastinal]
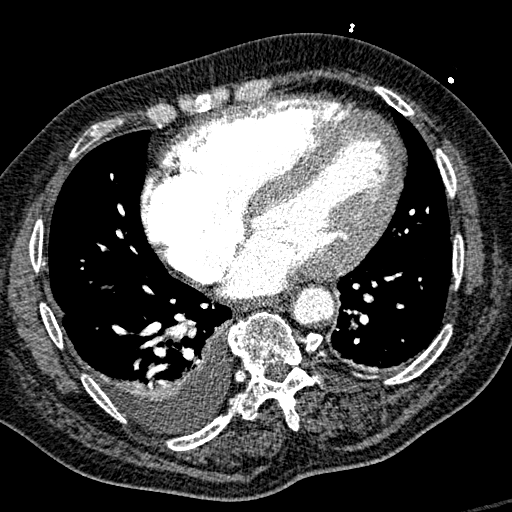
[im 97/276  lung]
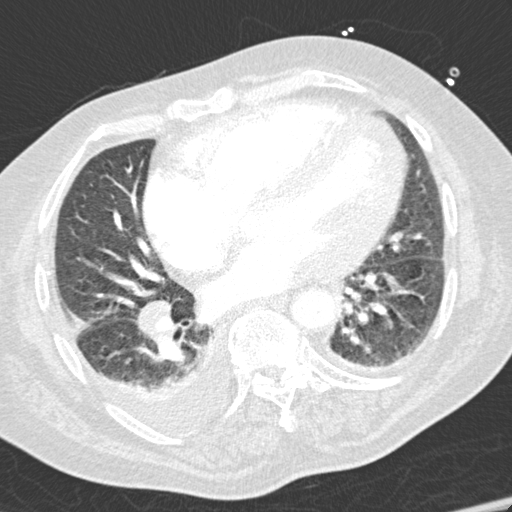
[im 111/276  mediastinal]
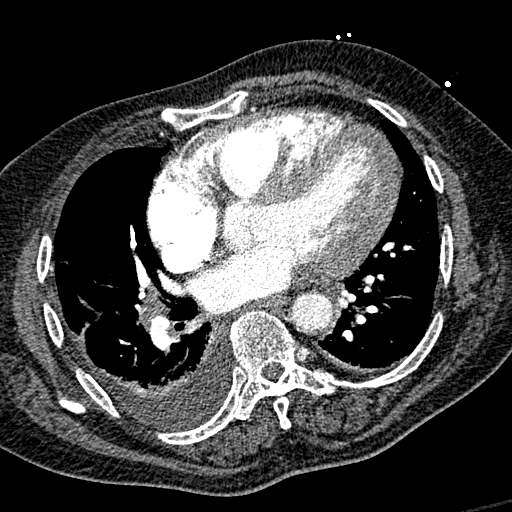
[im 124/276  lung]
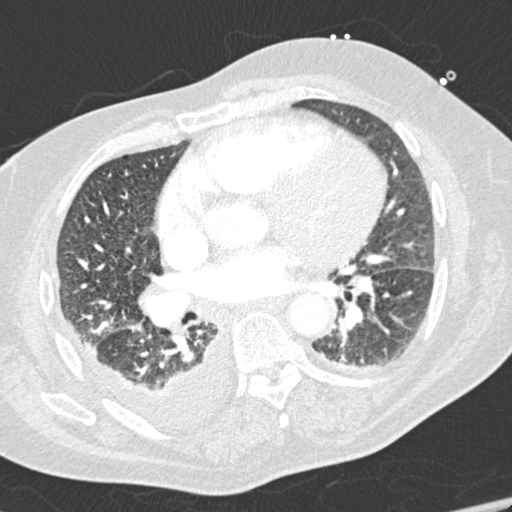
[im 152/276  mediastinal]
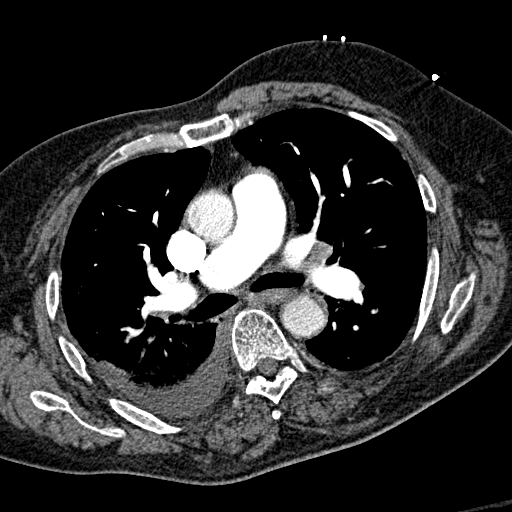
[im 166/276  lung]
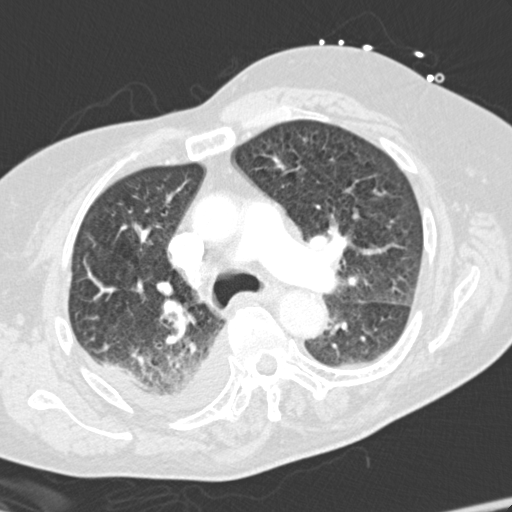
[im 179/276  mediastinal]
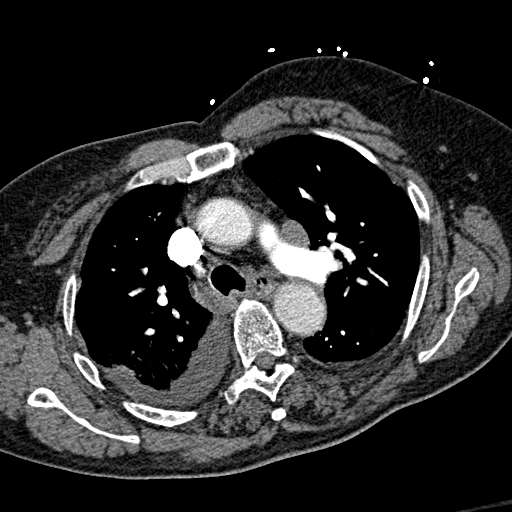
[im 193/276  lung]
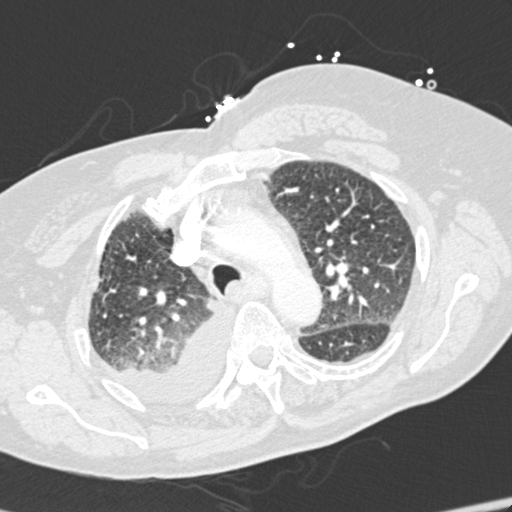
[im 207/276  mediastinal]
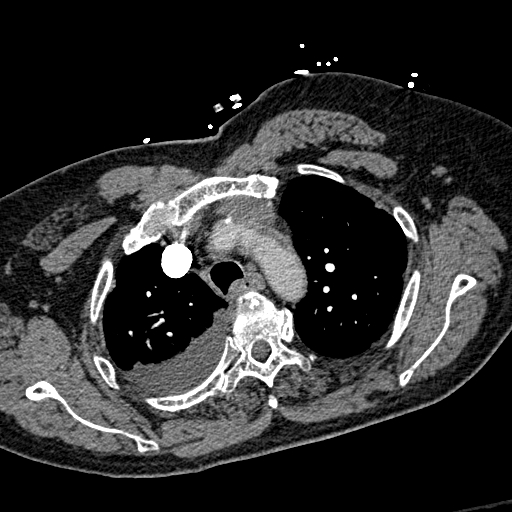
[im 221/276  lung]
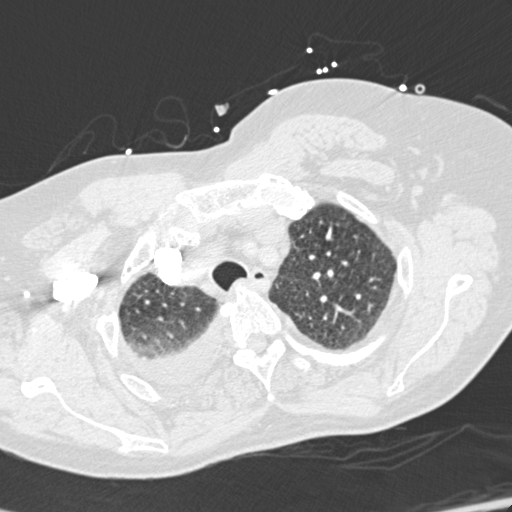
[im 234/276  mediastinal]
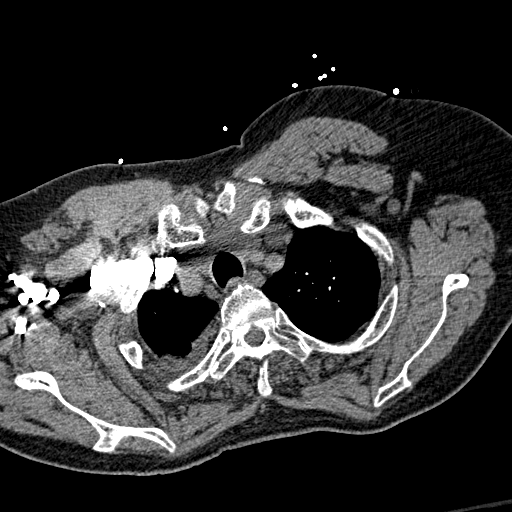
[im 248/276  lung]
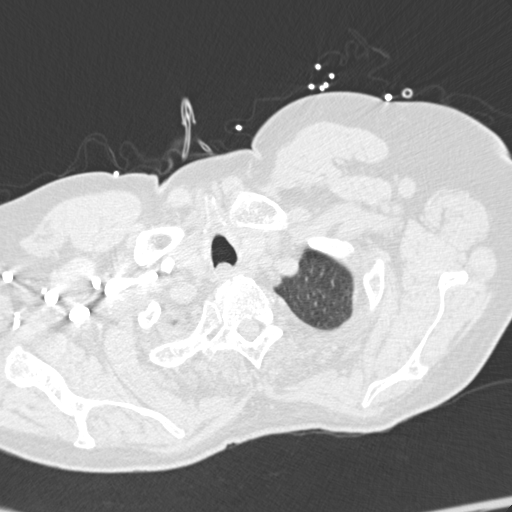
[im 262/276  mediastinal]
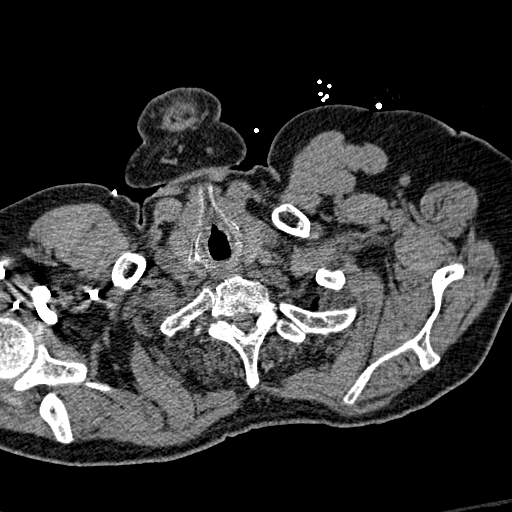

[Series 603: cor · coronal · 0.65mm/px · 1 of 112 slices shown]
[im 56/112  mediastinal]
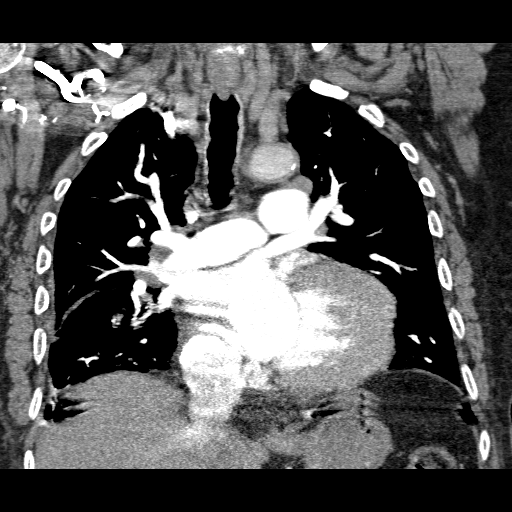

[19 of 36 positions shown; findings below may reference images not displayed]

FINDINGS: Satisfactory opacification of the pulmonary arteries
noted, and there is no evidence of pulmonary emboli.  No evidence
of thoracic aortic aneurysm or dissection.

Mild to moderate cardiomegaly is noted.  Diffuse interstitial
infiltrates are also seen bilaterally, most likely due to
interstitial edema.  Small right pleural effusion and right lower
lobe atelectasis also seen, and consistent with congestive heart
failure.

Shotty bilateral hilar and mediastinal lymph nodes are seen
measuring 1-2 cm in short axis.  These are nonspecific and most
likely due to lymphedema or reactive in etiology.  No central
endobronchial lesion identified.
IMPRESSION: 1.  No evidence of pulmonary embolism.
2.  Congestive heart failure with small right pleural effusion.
3.  Shotty mediastinal and bilateral hilar lymph nodes, most likely
secondary to lymphedema or reactive in etiology.
